# Patient Record
Sex: Female | Born: 1950 | Race: White | Hispanic: No | Marital: Married | Smoking: Former smoker
Health system: Southern US, Community
[De-identification: ages and names within clinical notes are randomized; demographics above are authoritative.]

## PROBLEM LIST (undated history)

## (undated) DIAGNOSIS — Z85828 Personal history of other malignant neoplasm of skin: Secondary | ICD-10-CM

## (undated) DIAGNOSIS — Z860100 Personal history of colon polyps, unspecified: Secondary | ICD-10-CM

## (undated) DIAGNOSIS — M5432 Sciatica, left side: Secondary | ICD-10-CM

## (undated) DIAGNOSIS — M722 Plantar fascial fibromatosis: Secondary | ICD-10-CM

## (undated) DIAGNOSIS — E119 Type 2 diabetes mellitus without complications: Secondary | ICD-10-CM

## (undated) DIAGNOSIS — Z8489 Family history of other specified conditions: Secondary | ICD-10-CM

## (undated) DIAGNOSIS — K219 Gastro-esophageal reflux disease without esophagitis: Secondary | ICD-10-CM

## (undated) DIAGNOSIS — I10 Essential (primary) hypertension: Secondary | ICD-10-CM

## (undated) DIAGNOSIS — IMO0001 Reserved for inherently not codable concepts without codable children: Secondary | ICD-10-CM

## (undated) DIAGNOSIS — Z9889 Other specified postprocedural states: Secondary | ICD-10-CM

## (undated) DIAGNOSIS — C801 Malignant (primary) neoplasm, unspecified: Secondary | ICD-10-CM

## (undated) DIAGNOSIS — E669 Obesity, unspecified: Secondary | ICD-10-CM

## (undated) DIAGNOSIS — M1990 Unspecified osteoarthritis, unspecified site: Secondary | ICD-10-CM

## (undated) DIAGNOSIS — R809 Proteinuria, unspecified: Secondary | ICD-10-CM

## (undated) DIAGNOSIS — F5104 Psychophysiologic insomnia: Secondary | ICD-10-CM

## (undated) DIAGNOSIS — R252 Cramp and spasm: Secondary | ICD-10-CM

## (undated) DIAGNOSIS — I4510 Unspecified right bundle-branch block: Secondary | ICD-10-CM

## (undated) DIAGNOSIS — E785 Hyperlipidemia, unspecified: Secondary | ICD-10-CM

## (undated) DIAGNOSIS — J189 Pneumonia, unspecified organism: Secondary | ICD-10-CM

## (undated) DIAGNOSIS — M5431 Sciatica, right side: Secondary | ICD-10-CM

## (undated) HISTORY — DX: Hyperlipidemia, unspecified: E78.5

## (undated) HISTORY — PX: VARICOSE VEIN SURGERY: SHX832

## (undated) HISTORY — DX: Personal history of other malignant neoplasm of skin: Z85.828

## (undated) HISTORY — DX: Gastro-esophageal reflux disease without esophagitis: K21.9

## (undated) HISTORY — DX: Personal history of colon polyps, unspecified: Z86.0100

## (undated) HISTORY — DX: Unspecified osteoarthritis, unspecified site: M19.90

## (undated) HISTORY — DX: Sciatica, right side: M54.32

## (undated) HISTORY — PX: OTHER SURGICAL HISTORY: SHX169

## (undated) HISTORY — DX: Unspecified right bundle-branch block: I45.10

## (undated) HISTORY — DX: Cramp and spasm: R25.2

## (undated) HISTORY — DX: Plantar fascial fibromatosis: M72.2

## (undated) HISTORY — DX: Essential (primary) hypertension: I10

## (undated) HISTORY — DX: Proteinuria, unspecified: R80.9

## (undated) HISTORY — DX: Personal history of colonic polyps: Z86.010

## (undated) HISTORY — DX: Reserved for inherently not codable concepts without codable children: IMO0001

## (undated) HISTORY — DX: Sciatica, left side: M54.31

## (undated) HISTORY — DX: Type 2 diabetes mellitus without complications: E11.9

## (undated) HISTORY — DX: Obesity, unspecified: E66.9

## (undated) HISTORY — DX: Psychophysiologic insomnia: F51.04

---

## 2004-01-16 ENCOUNTER — Ambulatory Visit: Payer: Self-pay | Admitting: Unknown Physician Specialty

## 2005-01-10 ENCOUNTER — Ambulatory Visit: Payer: Self-pay | Admitting: Family Medicine

## 2005-01-20 ENCOUNTER — Ambulatory Visit: Payer: Self-pay | Admitting: Family Medicine

## 2005-08-14 ENCOUNTER — Ambulatory Visit: Payer: Self-pay | Admitting: Family Medicine

## 2008-09-21 ENCOUNTER — Emergency Department (HOSPITAL_COMMUNITY): Admission: EM | Admit: 2008-09-21 | Discharge: 2008-09-21 | Payer: Self-pay | Admitting: Emergency Medicine

## 2008-11-10 LAB — HM DEXA SCAN: HM Dexa Scan: NORMAL

## 2008-11-16 ENCOUNTER — Ambulatory Visit: Payer: Self-pay | Admitting: Obstetrics and Gynecology

## 2008-11-21 ENCOUNTER — Ambulatory Visit: Payer: Self-pay | Admitting: Obstetrics and Gynecology

## 2008-11-22 ENCOUNTER — Ambulatory Visit: Payer: Self-pay | Admitting: Obstetrics and Gynecology

## 2010-06-05 ENCOUNTER — Ambulatory Visit: Payer: Self-pay | Admitting: Obstetrics and Gynecology

## 2010-10-10 LAB — HM COLONOSCOPY

## 2011-08-29 HISTORY — PX: FOOT SURGERY: SHX648

## 2012-09-09 LAB — HM PAP SMEAR: HM PAP: NORMAL

## 2012-09-10 LAB — HM MAMMOGRAPHY: HM Mammogram: NORMAL

## 2012-09-27 ENCOUNTER — Ambulatory Visit: Payer: Self-pay | Admitting: Obstetrics and Gynecology

## 2013-02-10 HISTORY — PX: BREAST BIOPSY: SHX20

## 2013-03-07 LAB — LIPID PANEL
CHOLESTEROL: 143 mg/dL (ref 0–200)
HDL: 36 mg/dL (ref 35–70)
LDL Cholesterol: 76 mg/dL
Triglycerides: 154 mg/dL (ref 40–160)

## 2013-10-31 ENCOUNTER — Ambulatory Visit: Payer: Self-pay | Admitting: Obstetrics and Gynecology

## 2013-11-03 ENCOUNTER — Ambulatory Visit: Payer: Self-pay | Admitting: Obstetrics and Gynecology

## 2013-11-07 ENCOUNTER — Ambulatory Visit: Payer: Self-pay | Admitting: Obstetrics and Gynecology

## 2013-11-09 LAB — PATHOLOGY REPORT

## 2013-12-12 LAB — HEMOGLOBIN A1C: Hgb A1c MFr Bld: 6.1 % — AB (ref 4.0–6.0)

## 2014-03-02 ENCOUNTER — Ambulatory Visit: Payer: Self-pay | Admitting: Family Medicine

## 2014-11-03 ENCOUNTER — Encounter: Payer: Self-pay | Admitting: Family Medicine

## 2014-11-03 DIAGNOSIS — G4700 Insomnia, unspecified: Secondary | ICD-10-CM | POA: Insufficient documentation

## 2014-11-03 DIAGNOSIS — M5430 Sciatica, unspecified side: Secondary | ICD-10-CM | POA: Insufficient documentation

## 2014-11-03 DIAGNOSIS — M8580 Other specified disorders of bone density and structure, unspecified site: Secondary | ICD-10-CM | POA: Insufficient documentation

## 2014-11-03 DIAGNOSIS — M1990 Unspecified osteoarthritis, unspecified site: Secondary | ICD-10-CM | POA: Insufficient documentation

## 2014-11-03 DIAGNOSIS — I4510 Unspecified right bundle-branch block: Secondary | ICD-10-CM | POA: Insufficient documentation

## 2014-11-03 DIAGNOSIS — K219 Gastro-esophageal reflux disease without esophagitis: Secondary | ICD-10-CM | POA: Insufficient documentation

## 2014-11-03 DIAGNOSIS — M722 Plantar fascial fibromatosis: Secondary | ICD-10-CM | POA: Insufficient documentation

## 2014-11-03 DIAGNOSIS — I10 Essential (primary) hypertension: Secondary | ICD-10-CM | POA: Insufficient documentation

## 2014-11-03 DIAGNOSIS — Z85828 Personal history of other malignant neoplasm of skin: Secondary | ICD-10-CM | POA: Insufficient documentation

## 2014-11-03 DIAGNOSIS — R809 Proteinuria, unspecified: Secondary | ICD-10-CM | POA: Insufficient documentation

## 2014-11-03 DIAGNOSIS — Z86010 Personal history of colonic polyps: Secondary | ICD-10-CM | POA: Insufficient documentation

## 2014-11-03 DIAGNOSIS — E785 Hyperlipidemia, unspecified: Secondary | ICD-10-CM | POA: Insufficient documentation

## 2014-11-03 DIAGNOSIS — E1121 Type 2 diabetes mellitus with diabetic nephropathy: Secondary | ICD-10-CM | POA: Insufficient documentation

## 2014-11-03 DIAGNOSIS — R252 Cramp and spasm: Secondary | ICD-10-CM | POA: Insufficient documentation

## 2014-11-06 ENCOUNTER — Ambulatory Visit (INDEPENDENT_AMBULATORY_CARE_PROVIDER_SITE_OTHER): Payer: Medicare PPO | Admitting: Family Medicine

## 2014-11-06 ENCOUNTER — Encounter: Payer: Self-pay | Admitting: Family Medicine

## 2014-11-06 VITALS — BP 132/68 | HR 96 | Temp 98.2°F | Resp 16 | Ht 70.0 in | Wt 225.1 lb

## 2014-11-06 DIAGNOSIS — R809 Proteinuria, unspecified: Secondary | ICD-10-CM

## 2014-11-06 DIAGNOSIS — R430 Anosmia: Secondary | ICD-10-CM | POA: Diagnosis not present

## 2014-11-06 DIAGNOSIS — G4700 Insomnia, unspecified: Secondary | ICD-10-CM

## 2014-11-06 DIAGNOSIS — Z23 Encounter for immunization: Secondary | ICD-10-CM | POA: Diagnosis not present

## 2014-11-06 DIAGNOSIS — Z1239 Encounter for other screening for malignant neoplasm of breast: Secondary | ICD-10-CM

## 2014-11-06 DIAGNOSIS — E1121 Type 2 diabetes mellitus with diabetic nephropathy: Secondary | ICD-10-CM

## 2014-11-06 DIAGNOSIS — E785 Hyperlipidemia, unspecified: Secondary | ICD-10-CM | POA: Diagnosis not present

## 2014-11-06 DIAGNOSIS — R066 Hiccough: Secondary | ICD-10-CM

## 2014-11-06 DIAGNOSIS — K219 Gastro-esophageal reflux disease without esophagitis: Secondary | ICD-10-CM | POA: Diagnosis not present

## 2014-11-06 DIAGNOSIS — M150 Primary generalized (osteo)arthritis: Secondary | ICD-10-CM | POA: Diagnosis not present

## 2014-11-06 DIAGNOSIS — M159 Polyosteoarthritis, unspecified: Secondary | ICD-10-CM

## 2014-11-06 DIAGNOSIS — I10 Essential (primary) hypertension: Secondary | ICD-10-CM | POA: Diagnosis not present

## 2014-11-06 LAB — POCT UA - MICROALBUMIN: MICROALBUMIN (UR) POC: 20 mg/L

## 2014-11-06 LAB — POCT GLYCOSYLATED HEMOGLOBIN (HGB A1C): HEMOGLOBIN A1C: 9.2

## 2014-11-06 MED ORDER — OMEPRAZOLE 20 MG PO TBEC
1.0000 | DELAYED_RELEASE_TABLET | Freq: Every morning | ORAL | Status: DC
Start: 2014-11-06 — End: 2014-11-09

## 2014-11-06 MED ORDER — BENAZEPRIL HCL 40 MG PO TABS: 40.0000 mg | ORAL_TABLET | Freq: Every day | ORAL | Status: DC

## 2014-11-06 MED ORDER — ATORVASTATIN CALCIUM 40 MG PO TABS: 40.0000 mg | ORAL_TABLET | Freq: Every day | ORAL | Status: DC

## 2014-11-06 MED ORDER — NATEGLINIDE 120 MG PO TABS: 120.0000 mg | ORAL_TABLET | Freq: Two times a day (BID) | ORAL | Status: DC

## 2014-11-06 MED ORDER — AMLODIPINE BESYLATE 10 MG PO TABS: 10.0000 mg | ORAL_TABLET | Freq: Every day | ORAL | Status: DC

## 2014-11-06 MED ORDER — ATENOLOL-CHLORTHALIDONE 50-25 MG PO TABS: 1.0000 | ORAL_TABLET | Freq: Every day | ORAL | Status: DC

## 2014-11-06 MED ORDER — METFORMIN HCL 1000 MG PO TABS: 1000.0000 mg | ORAL_TABLET | Freq: Two times a day (BID) | ORAL | Status: DC

## 2014-11-06 MED ORDER — MELOXICAM 15 MG PO TABS: 15.0000 mg | ORAL_TABLET | Freq: Every day | ORAL | Status: DC

## 2014-11-06 MED ORDER — ZOLPIDEM TARTRATE 5 MG PO TABS: 5.0000 mg | ORAL_TABLET | Freq: Every evening | ORAL | Status: DC

## 2014-11-06 NOTE — Progress Notes (Signed)
Name: Michelle Montgomery   MRN: [id number]    DOB: 1950-06-24   Date:11/06/2014       Progress Note  Subjective  Chief Complaint  Chief Complaint  Patient presents with  . Medication Refill    6 month F/U  . Diabetes    Checks once daily, Low-118 Average-140 High-187  . Hypertension    Checks at pharmacy and gets 118/70's  . Hyperlipidemia  . Insomnia    HPI  DMII with nephropathy: she states she has been eating out a lot, not following a diabetic diet, fsbs is around 140's - usually fasting, not checking 2 hour post-prandially. She states she has noticed polyphagia, polydipsia and polyuria and feeling more tired than usual. No blurred vision. Taking Metformin as prescribed. Also taking Benazepril ( Ace ) as prescribed. Recently seen by Dermatologist and treated for yeast vaginitis.   HTN: taking bp medication as prescribed, denies chest pain, palpitation or SOB . BP is at goal, denies side effects  Hyperlipidemia: taking Atorvastatin, she denies myalgia.  Insomnia: taking Ambien, states it helps her fall asleep and stay asleep. Denies side effects  Lack of sense of smell and taste: symptoms started about 6 months ago. She cannot smell dirty diapers, skunk smell or her dogs bad breath. Also unable to taste the food she eats.  No headache, no blurred vision, no neuro deficit or hoarseness. She also had 5 days of hiccups last week but resolved by itself  GERD: taking Omeprazole, and no heartburn or regurgitation noticed.   Obesity: gained weight since last visit, not following a diet, she states she will lose weight again  Patient Active Problem List   Diagnosis Date Noted  . Osteoarthritis 11/03/2014  . Cramps of lower extremity 11/03/2014  . Neuralgia neuritis, sciatic nerve 11/03/2014  . Insomnia, persistent 11/03/2014  . Dyslipidemia 11/03/2014  . Type 2 diabetes with nephropathy 11/03/2014  . Essential (primary) hypertension 11/03/2014  . History of colon polyps  11/03/2014  . H/O malignant neoplasm of skin 11/03/2014  . Microalbuminuria 11/03/2014  . Obesity, morbid 11/03/2014  . Osteopenia 11/03/2014  . Plantar fasciitis 11/03/2014  . Gastro-esophageal reflux disease without esophagitis 11/03/2014  . Bundle branch block, right 11/03/2014    Past Surgical History  Procedure Laterality Date  . Excision of breast biopsy Left 11/07/2013    Negative for Cancer  . Foot surgery Left 08/29/2011    Spur Removal , and Achilles Tendon Tendolysis  . Varicose vein surgery Bilateral     Family History  Problem Relation Age of Onset  . Cancer Mother     Colon  . Anemia Mother   . Cancer Father     Esophageal  . Diabetes Brother   . Cancer Maternal Uncle     Colon  . Cancer Maternal Grandmother     Colon    Social History   Social History  . Marital Status: Married    Spouse Name: N/A  . Number of Children: N/A  . Years of Education: N/A   Occupational History  . Not on file.   Social History Main Topics  . Smoking status: Former Smoker -- 12.00 packs/day    Types: Cigarettes    Quit date: 02/10/1970  . Smokeless tobacco: Never Used  . Alcohol Use: No  . Drug Use: No  . Sexual Activity: Yes   Other Topics Concern  . Not on file   Social History Narrative     Current outpatient prescriptions:  .  amLODipine (NORVASC) 10 MG tablet, Take 1 tablet (10 mg total) by mouth daily., Disp: 90 tablet, Rfl: 1 .  atenolol-chlorthalidone (TENORETIC) 50-25 MG per tablet, Take 1 tablet by mouth daily., Disp: 90 tablet, Rfl: 1 .  atorvastatin (LIPITOR) 40 MG tablet, Take 1 tablet (40 mg total) by mouth daily., Disp: 90 tablet, Rfl: 1 .  benazepril (LOTENSIN) 40 MG tablet, Take 1 tablet (40 mg total) by mouth daily., Disp: 90 tablet, Rfl: 1 .  meloxicam (MOBIC) 15 MG tablet, Take 1 tablet (15 mg total) by mouth daily., Disp: 90 tablet, Rfl: 0 .  Omeprazole 20 MG TBEC, Take 1 tablet (20 mg total) by mouth every morning., Disp: 90 each, Rfl:  1 .  zolpidem (AMBIEN) 5 MG tablet, Take 1 tablet (5 mg total) by mouth every evening., Disp: 90 tablet, Rfl: 0 .  aspirin 81 MG tablet, Take 1 tablet by mouth daily., Disp: , Rfl:  .  CALCIUM CARBONATE-VIT D-MIN PO, Take 1 tablet by mouth daily., Disp: , Rfl:  .  Cholecalciferol (VITAMIN D) 2000 UNITS CAPS, Take 1 tablet by mouth daily., Disp: , Rfl:  .  conjugated estrogens (PREMARIN) vaginal cream, Place vaginally., Disp: , Rfl:  .  doxepin (SINEQUAN) 10 MG capsule, Take 1 capsule by mouth every evening., Disp: , Rfl:  .  fluconazole (DIFLUCAN) 150 MG tablet, Take 1 tablet by mouth once a week., Disp: , Rfl:  .  metFORMIN (GLUCOPHAGE) 1000 MG tablet, Take 1 tablet (1,000 mg total) by mouth 2 (two) times daily with a meal., Disp: 180 tablet, Rfl: 1 .  nateglinide (STARLIX) 120 MG tablet, Take 1 tablet (120 mg total) by mouth 2 (two) times daily with a meal., Disp: 180 tablet, Rfl: 0 .  Omega-3 Fatty Acids (FISH OIL CONCENTRATE) 435 MG CAPS, Take 1 tablet by mouth daily., Disp: , Rfl:   Allergies  Allergen Reactions  . Codeine   . Penicillins      ROS  Constitutional: Negative for fever , positive for  weight change.  Respiratory: Negative for cough and shortness of breath.   Cardiovascular: Negative for chest pain or palpitations.  Gastrointestinal: Negative for abdominal pain, no bowel changes.  Musculoskeletal: Negative for gait problem or joint swelling.  Skin: Negative for rash.  Neurological: Negative for dizziness or headache.  No other specific complaints in a complete review of systems (except as listed in HPI above).  Objective  Filed Vitals:   11/06/14 0824  BP: 132/68  Pulse: 96  Temp: 98.2 F (36.8 C)  TempSrc: Oral  Resp: 16  Height: 5\' 10"  (1.778 m)  Weight: 225 lb 1.6 oz (102.105 kg)  SpO2: 97%    Body mass index is 32.3 kg/(m^2).  Physical Exam  Constitutional: Patient appears well-developed and well-nourished. Obese  No distress.  HEENT: head  atraumatic, normocephalic, pupils equal and reactive to light,  neck supple, throat within normal limits Cardiovascular: Normal rate, regular rhythm and normal heart sounds.  No murmur heard. No BLE edema. Pulmonary/Chest: Effort normal and breath sounds normal. No respiratory distress. Abdominal: Soft.  There is no tenderness. Psychiatric: Patient has a normal mood and affect. behavior is normal. Judgment and thought content normal.  Recent Results (from the past 2160 hour(s))  POCT HgB A1C     Status: Abnormal   Collection Time: 11/06/14  8:28 AM  Result Value Ref Range   Hemoglobin A1C 9.2     Diabetic Foot Exam: Diabetic Foot Exam - Simple   Simple  Foot Form  Visual Inspection  See comments:  Yes  Sensation Testing  Intact to touch and monofilament testing bilaterally:  Yes  Pulse Check  Posterior Tibialis and Dorsalis pulse intact bilaterally:  Yes  Comments  Thick toenails       PHQ2/9: Depression screen PHQ 2/9 11/06/2014  Decreased Interest 0  Down, Depressed, Hopeless 0  PHQ - 2 Score 0     Fall Risk: Fall Risk  11/06/2014  Falls in the past year? No      Functional Status Survey: Is the patient deaf or have difficulty hearing?: No Does the patient have difficulty seeing, even when wearing glasses/contacts?: Yes (glasses) Does the patient have difficulty concentrating, remembering, or making decisions?: No Does the patient have difficulty walking or climbing stairs?: No Does the patient have difficulty dressing or bathing?: No Does the patient have difficulty doing errands alone such as visiting a doctor's office or shopping?: No    Assessment & Plan  1. Type 2 diabetes mellitus with diabetic nephropathy  Discussed medication option, she wants generic medication only. We will increase metformin, discussed importance of resuming diet - POCT HgB A1C - metFORMIN (GLUCOPHAGE) 1000 MG tablet; Take 1 tablet (1,000 mg total) by mouth 2 (two) times daily with  a meal.  Dispense: 180 tablet; Refill: 1 - nateglinide (STARLIX) 120 MG tablet; Take 1 tablet (120 mg total) by mouth 2 (two) times daily with a meal.  Dispense: 180 tablet; Refill: 0  2. Needs flu shot  - Flu vaccine HIGH DOSE PF (Fluzone High dose)  3. Microalbuminuria  - benazepril (LOTENSIN) 40 MG tablet; Take 1 tablet (40 mg total) by mouth daily.  Dispense: 90 tablet; Refill: 1  4. Obesity, morbid  Discussed with the patient the risk posed by an increased BMI. Discussed importance of portion control, calorie counting and at least 150 minutes of physical activity weekly. Avoid sweet beverages and drink more water. Eat at least 6 servings of fruit and vegetables daily   5. Essential (primary) hypertension  - Comprehensive metabolic panel - CBC with Differential/Platelet - atenolol-chlorthalidone (TENORETIC) 50-25 MG per tablet; Take 1 tablet by mouth daily.  Dispense: 90 tablet; Refill: 1 - amLODipine (NORVASC) 10 MG tablet; Take 1 tablet (10 mg total) by mouth daily.  Dispense: 90 tablet; Refill: 1 6. Insomnia, persistent  - zolpidem (AMBIEN) 5 MG tablet; Take 1 tablet (5 mg total) by mouth every evening.  Dispense: 90 tablet; Refill: 0  7. Dyslipidemia  - atorvastatin (LIPITOR) 40 MG tablet; Take 1 tablet (40 mg total) by mouth daily.  Dispense: 90 tablet; Refill: 1 - Lipid panel  8. Gastro-esophageal reflux disease without esophagitis  - Omeprazole 20 MG TBEC; Take 1 tablet (20 mg total) by mouth every morning.  Dispense: 90 each; Refill: 1  9. Anosmia  - Ambulatory referral to ENT  10. Hiccups  - Ambulatory referral to ENT  11. Primary osteoarthritis involving multiple joints  Taking medication prn  - meloxicam (MOBIC) 15 MG tablet; Take 1 tablet (15 mg total) by mouth daily.  Dispense: 90 tablet; Refill: 0  12. Breast cancer screening  - MM Digital Screening; Future

## 2014-11-07 LAB — COMPREHENSIVE METABOLIC PANEL
ALBUMIN: 4.9 g/dL — AB (ref 3.6–4.8)
ALK PHOS: 46 IU/L (ref 39–117)
ALT: 43 IU/L — ABNORMAL HIGH (ref 0–32)
AST: 27 IU/L (ref 0–40)
Albumin/Globulin Ratio: 2.1 (ref 1.1–2.5)
BILIRUBIN TOTAL: 0.5 mg/dL (ref 0.0–1.2)
BUN / CREAT RATIO: 21 (ref 11–26)
BUN: 16 mg/dL (ref 8–27)
CHLORIDE: 95 mmol/L — AB (ref 97–108)
CO2: 26 mmol/L (ref 18–29)
Calcium: 10.1 mg/dL (ref 8.7–10.3)
Creatinine, Ser: 0.75 mg/dL (ref 0.57–1.00)
GFR calc Af Amer: 97 mL/min/{1.73_m2} (ref >59)
GFR calc non Af Amer: 84 mL/min/{1.73_m2} (ref >59)
GLOBULIN, TOTAL: 2.3 g/dL (ref 1.5–4.5)
Glucose: 227 mg/dL — ABNORMAL HIGH (ref 65–99)
POTASSIUM: 4.6 mmol/L (ref 3.5–5.2)
SODIUM: 140 mmol/L (ref 134–144)
Total Protein: 7.2 g/dL (ref 6.0–8.5)

## 2014-11-07 LAB — CBC WITH DIFFERENTIAL/PLATELET
BASOS ABS: 0 10*3/uL (ref 0.0–0.2)
Basos: 0 %
EOS (ABSOLUTE): 0.2 10*3/uL (ref 0.0–0.4)
Eos: 3 %
HEMATOCRIT: 41.3 % (ref 34.0–46.6)
Hemoglobin: 14.2 g/dL (ref 11.1–15.9)
Immature Grans (Abs): 0 10*3/uL (ref 0.0–0.1)
Immature Granulocytes: 0 %
LYMPHS ABS: 1.8 10*3/uL (ref 0.7–3.1)
Lymphs: 30 %
MCH: 31.1 pg (ref 26.6–33.0)
MCHC: 34.4 g/dL (ref 31.5–35.7)
MCV: 91 fL (ref 79–97)
MONOS ABS: 0.7 10*3/uL (ref 0.1–0.9)
Monocytes: 11 %
Neutrophils Absolute: 3.2 10*3/uL (ref 1.4–7.0)
Neutrophils: 56 %
Platelets: 225 10*3/uL (ref 150–379)
RBC: 4.56 x10E6/uL (ref 3.77–5.28)
RDW: 13.6 % (ref 12.3–15.4)
WBC: 5.8 10*3/uL (ref 3.4–10.8)

## 2014-11-07 LAB — LIPID PANEL
CHOL/HDL RATIO: 3 ratio (ref 0.0–4.4)
Cholesterol, Total: 108 mg/dL (ref 100–199)
HDL: 36 mg/dL — ABNORMAL LOW (ref >39)
LDL Calculated: 38 mg/dL (ref 0–99)
TRIGLYCERIDES: 172 mg/dL — AB (ref 0–149)
VLDL Cholesterol Cal: 34 mg/dL (ref 5–40)

## 2014-11-08 ENCOUNTER — Telehealth: Payer: Self-pay

## 2014-11-08 NOTE — Progress Notes (Signed)
Patient notified

## 2014-11-08 NOTE — Telephone Encounter (Signed)
Left a message for patient to return my call regarding lab results.

## 2014-11-08 NOTE — Telephone Encounter (Signed)
-----   Message from Steele Sizer, MD sent at 11/07/2014  9:26 PM EDT ----- Glucose out of control, albumin is elevated, one of the liver enzymes slightly bumped but not to worry about it Normal CBC Lipid panel shows low HDL : to improve HDL patient  needs to eat tree nuts ( pecans/pistachios/almonds ) four times weekly, eat fish two times weekly  and exercise  at least 150 minutes per week. Continue statin Triglycerides will decrease once glucose gets under control

## 2014-11-09 ENCOUNTER — Other Ambulatory Visit: Payer: Self-pay

## 2014-11-09 DIAGNOSIS — K219 Gastro-esophageal reflux disease without esophagitis: Secondary | ICD-10-CM

## 2014-11-09 MED ORDER — OMEPRAZOLE 20 MG PO CPDR: 20.0000 mg | DELAYED_RELEASE_CAPSULE | Freq: Every day | ORAL | Status: DC

## 2015-01-16 ENCOUNTER — Encounter: Payer: Self-pay | Admitting: Family Medicine

## 2015-01-16 ENCOUNTER — Ambulatory Visit (INDEPENDENT_AMBULATORY_CARE_PROVIDER_SITE_OTHER): Payer: Medicare PPO | Admitting: Family Medicine

## 2015-01-16 VITALS — BP 122/84 | HR 86 | Temp 98.1°F | Resp 18 | Ht 70.0 in | Wt 222.5 lb

## 2015-01-16 DIAGNOSIS — R431 Parosmia: Secondary | ICD-10-CM

## 2015-01-16 DIAGNOSIS — E785 Hyperlipidemia, unspecified: Secondary | ICD-10-CM

## 2015-01-16 DIAGNOSIS — G4700 Insomnia, unspecified: Secondary | ICD-10-CM

## 2015-01-16 DIAGNOSIS — K219 Gastro-esophageal reflux disease without esophagitis: Secondary | ICD-10-CM

## 2015-01-16 DIAGNOSIS — I10 Essential (primary) hypertension: Secondary | ICD-10-CM | POA: Diagnosis not present

## 2015-01-16 DIAGNOSIS — R809 Proteinuria, unspecified: Secondary | ICD-10-CM

## 2015-01-16 DIAGNOSIS — R439 Unspecified disturbances of smell and taste: Secondary | ICD-10-CM | POA: Insufficient documentation

## 2015-01-16 DIAGNOSIS — E1121 Type 2 diabetes mellitus with diabetic nephropathy: Secondary | ICD-10-CM | POA: Diagnosis not present

## 2015-01-16 MED ORDER — OMEPRAZOLE 20 MG PO CPDR
20.0000 mg | DELAYED_RELEASE_CAPSULE | Freq: Every day | ORAL | Status: DC
Start: 2015-01-16 — End: 2016-02-12

## 2015-01-16 MED ORDER — GLIPIZIDE ER 2.5 MG PO TB24: 2.5000 mg | ORAL_TABLET | Freq: Every day | ORAL | Status: DC

## 2015-01-16 MED ORDER — TRIAMCINOLONE ACETONIDE 55 MCG/ACT NA AERO: 2.0000 | INHALATION_SPRAY | Freq: Every day | NASAL | Status: DC

## 2015-01-16 NOTE — Progress Notes (Signed)
Name: Michelle Montgomery   MRN: [id number]    DOB: 1950/08/03   Date:01/16/2015       Progress Note  Subjective  Chief Complaint  Chief Complaint  Patient presents with  . Medication Refill    follow-up  . Diabetes    Checks BG 1xday low-125, high-160.  Nkever started starlix it was to exspensive, needs something cheaper.  . Hypertension  . Insomnia  . Gastroesophageal Reflux  . Hyperlipidemia    HPI  DMII with nephropathy: she is trying to cook more at home and having more vegetables. FSBS 130-140's usually fasting, not checking 2 hour post-prandially. She states she has noticed polyphagia, she denies  polydipsia or polyuria  No blurred vision. Taking Metformin as prescribed, unable to afford the Starlix prescribed last week and would like to try something cheaper. Also taking Benazepril ( Ace ) as prescribed.  HTN: taking bp medication as prescribed, denies chest pain, palpitation or SOB . BP is at goal, denies side effects   Hyperlipidemia: taking Atorvastatin, she denies myalgia.   Insomnia: taking Ambien, states it helps her fall asleep and stay asleep. Denies side effects. No amnesia  Lack of sense of smell and taste: symptoms started about 9 months ago. She cannot smell dirty diapers, skunk smell or her dogs bad breath, however she was seen by ENT and has been using nasal steroid daily and symptoms have improved.  Obesity: she lost 3 lbs since last visit, she has been trying to eat better  GERD: takes Prilosec daily and not heartburn or regurgitation as long as she takes it daily, tried stopping medication but symptoms resolves.   Patient Active Problem List   Diagnosis Date Noted  . Smell disturbance 01/16/2015  . Osteoarthritis 11/03/2014  . Cramps of lower extremity 11/03/2014  . Neuralgia neuritis, sciatic nerve 11/03/2014  . Insomnia, persistent 11/03/2014  . Dyslipidemia 11/03/2014  . Type 2 diabetes with nephropathy ([place]) 11/03/2014  . Essential (primary)  hypertension 11/03/2014  . History of colon polyps 11/03/2014  . H/O malignant neoplasm of skin 11/03/2014  . Microalbuminuria 11/03/2014  . Obesity, morbid ([place]) 11/03/2014  . Osteopenia 11/03/2014  . Plantar fasciitis 11/03/2014  . Gastro-esophageal reflux disease without esophagitis 11/03/2014  . Bundle branch block, right 11/03/2014    Past Surgical History  Procedure Laterality Date  . Excision of breast biopsy Left 11/07/2013    Negative for Cancer  . Foot surgery Left 08/29/2011    Spur Removal , and Achilles Tendon Tendolysis  . Varicose vein surgery Bilateral     Family History  Problem Relation Age of Onset  . Cancer Mother     Colon  . Anemia Mother   . Cancer Father     Esophageal  . Diabetes Brother   . Cancer Maternal Uncle     Colon  . Cancer Maternal Grandmother     Colon    Social History   Social History  . Marital Status: Married    Spouse Name: N/A  . Number of Children: N/A  . Years of Education: N/A   Occupational History  . Not on file.   Social History Main Topics  . Smoking status: Former Smoker -- 12.00 packs/day    Types: Cigarettes    Quit date: 02/10/1970  . Smokeless tobacco: Never Used  . Alcohol Use: No  . Drug Use: No  . Sexual Activity: Yes   Other Topics Concern  . Not on file   Social History Narrative  Current outpatient prescriptions:  .  amLODipine (NORVASC) 10 MG tablet, Take 1 tablet (10 mg total) by mouth daily., Disp: 90 tablet, Rfl: 1 .  aspirin 81 MG tablet, Take 1 tablet by mouth daily., Disp: , Rfl:  .  atenolol-chlorthalidone (TENORETIC) 50-25 MG per tablet, Take 1 tablet by mouth daily., Disp: 90 tablet, Rfl: 1 .  atorvastatin (LIPITOR) 40 MG tablet, Take 1 tablet (40 mg total) by mouth daily., Disp: 90 tablet, Rfl: 1 .  benazepril (LOTENSIN) 40 MG tablet, Take 1 tablet (40 mg total) by mouth daily., Disp: 90 tablet, Rfl: 1 .  CALCIUM CARBONATE-VIT D-MIN PO, Take 1 tablet by mouth daily., Disp: ,  Rfl:  .  Cholecalciferol (VITAMIN D) 2000 UNITS CAPS, Take 1 tablet by mouth daily., Disp: , Rfl:  .  glipiZIDE (GLUCOTROL XL) 2.5 MG 24 hr tablet, Take 1 tablet (2.5 mg total) by mouth daily with breakfast., Disp: 90 tablet, Rfl: 0 .  meloxicam (MOBIC) 15 MG tablet, Take 1 tablet (15 mg total) by mouth daily., Disp: 90 tablet, Rfl: 0 .  metFORMIN (GLUCOPHAGE) 1000 MG tablet, Take 1 tablet (1,000 mg total) by mouth 2 (two) times daily with a meal., Disp: 180 tablet, Rfl: 1 .  Omega-3 Fatty Acids (FISH OIL CONCENTRATE) 435 MG CAPS, Take 1 tablet by mouth daily., Disp: , Rfl:  .  omeprazole (PRILOSEC) 20 MG capsule, Take 1 capsule (20 mg total) by mouth daily., Disp: 90 capsule, Rfl: 4 .  triamcinolone (NASACORT AQ) 55 MCG/ACT AERO nasal inhaler, Place 2 sprays into the nose daily., Disp: 1 Inhaler, Rfl: 0 .  zolpidem (AMBIEN) 5 MG tablet, Take 1 tablet (5 mg total) by mouth every evening., Disp: 90 tablet, Rfl: 0  Allergies  Allergen Reactions  . Codeine   . Penicillins      ROS  Constitutional: Negative for fever or significant weight change.  Respiratory: Negative for cough and shortness of breath.   Cardiovascular: Negative for chest pain or palpitations.  Gastrointestinal: Negative for abdominal pain, no bowel changes.  Musculoskeletal: Negative for gait problem or joint swelling.  Skin: Negative for rash.  Neurological: Negative for dizziness or headache.  No other specific complaints in a complete review of systems (except as listed in HPI above).  Objective  Filed Vitals:   01/16/15 0950  BP: 122/84  Pulse: 86  Temp: 98.1 F (36.7 C)  TempSrc: Oral  Resp: 18  Height: 5\' 10"  (1.778 m)  Weight: 222 lb 8 oz (100.925 kg)  SpO2: 92%    Body mass index is 31.93 kg/(m^2).  Physical Exam  Constitutional: Patient appears well-developed and well-nourished. Obese  No distress.  HEENT: head atraumatic, normocephalic, pupils equal and reactive to light, neck supple, throat  within normal limits Cardiovascular: Normal rate, regular rhythm and normal heart sounds.  No murmur heard. No BLE edema. Pulmonary/Chest: Effort normal and breath sounds normal. No respiratory distress. Abdominal: Soft.  There is no tenderness. Psychiatric: Patient has a normal mood and affect. behavior is normal. Judgment and thought content normal.  Recent Results (from the past 2160 hour(s))  POCT HgB A1C     Status: Abnormal   Collection Time: 11/06/14  8:28 AM  Result Value Ref Range   Hemoglobin A1C 9.2   Comprehensive metabolic panel     Status: Abnormal   Collection Time: 11/06/14  9:21 AM  Result Value Ref Range   Glucose 227 (H) 65 - 99 mg/dL   BUN 16 8 - 27 mg/dL  Creatinine, Ser 0.75 0.57 - 1.00 mg/dL   GFR calc non Af Amer 84 >59 mL/min/1.73   GFR calc Af Amer 97 >59 mL/min/1.73   BUN/Creatinine Ratio 21 11 - 26   Sodium 140 134 - 144 mmol/L   Potassium 4.6 3.5 - 5.2 mmol/L   Chloride 95 (L) 97 - 108 mmol/L   CO2 26 18 - 29 mmol/L   Calcium 10.1 8.7 - 10.3 mg/dL   Total Protein 7.2 6.0 - 8.5 g/dL   Albumin 4.9 (H) 3.6 - 4.8 g/dL   Globulin, Total 2.3 1.5 - 4.5 g/dL   Albumin/Globulin Ratio 2.1 1.1 - 2.5   Bilirubin Total 0.5 0.0 - 1.2 mg/dL   Alkaline Phosphatase 46 39 - 117 IU/L   AST 27 0 - 40 IU/L   ALT 43 (H) 0 - 32 IU/L  CBC with Differential/Platelet     Status: None   Collection Time: 11/06/14  9:21 AM  Result Value Ref Range   WBC 5.8 3.4 - 10.8 x10E3/uL   RBC 4.56 3.77 - 5.28 x10E6/uL   Hemoglobin 14.2 11.1 - 15.9 g/dL   Hematocrit 41.3 34.0 - 46.6 %   MCV 91 79 - 97 fL   MCH 31.1 26.6 - 33.0 pg   MCHC 34.4 31.5 - 35.7 g/dL   RDW 13.6 12.3 - 15.4 %   Platelets 225 150 - 379 x10E3/uL   Neutrophils 56 %   Lymphs 30 %   Monocytes 11 %   Eos 3 %   Basos 0 %   Neutrophils Absolute 3.2 1.4 - 7.0 x10E3/uL   Lymphocytes Absolute 1.8 0.7 - 3.1 x10E3/uL   Monocytes Absolute 0.7 0.1 - 0.9 x10E3/uL   EOS (ABSOLUTE) 0.2 0.0 - 0.4 x10E3/uL   Basophils  Absolute 0.0 0.0 - 0.2 x10E3/uL   Immature Granulocytes 0 %   Immature Grans (Abs) 0.0 0.0 - 0.1 x10E3/uL  Lipid panel     Status: Abnormal   Collection Time: 11/06/14  9:21 AM  Result Value Ref Range   Cholesterol, Total 108 100 - 199 mg/dL   Triglycerides 172 (H) 0 - 149 mg/dL   HDL 36 (L) >39 mg/dL    Comment: According to ATP-III Guidelines, HDL-C >59 mg/dL is considered a negative risk factor for CHD.    VLDL Cholesterol Cal 34 5 - 40 mg/dL   LDL Calculated 38 0 - 99 mg/dL   Chol/HDL Ratio 3.0 0.0 - 4.4 ratio units    Comment:                                   T. Chol/HDL Ratio                                             Men  Women                               1/2 Avg.Risk  3.4    3.3                                   Avg.Risk  5.0    4.4  2X Avg.Risk  9.6    7.1                                3X Avg.Risk 23.4   11.0   POCT UA - Microalbumin     Status: None   Collection Time: 11/06/14 12:16 PM  Result Value Ref Range   Microalbumin Ur, POC 20 mg/L   Creatinine, POC  mg/dL   Albumin/Creatinine Ratio, Urine, POC       PHQ2/9: Depression screen [facility] 2/9 01/16/2015 11/06/2014  Decreased Interest 0 0  Down, Depressed, Hopeless 0 0  PHQ - 2 Score 0 0     Fall Risk: Fall Risk  01/16/2015 11/06/2014  Falls in the past year? No No     Functional Status Survey: Is the patient deaf or have difficulty hearing?: No Does the patient have difficulty seeing, even when wearing glasses/contacts?: Yes (glasses) Does the patient have difficulty concentrating, remembering, or making decisions?: No Does the patient have difficulty walking or climbing stairs?: No Does the patient have difficulty dressing or bathing?: No Does the patient have difficulty doing errands alone such as visiting a doctor's office or shopping?: No    Assessment & Plan  1. Type 2 diabetes mellitus with diabetic nephropathy, without long-term current use of insulin (HCC)  -  glipiZIDE (GLUCOTROL XL) 2.5 MG 24 hr tablet; Take 1 tablet (2.5 mg total) by mouth daily with breakfast.  Dispense: 90 tablet; Refill: 0  2. Insomnia, persistent  Continue medication   3. Dyslipidemia  Lipid panel shows low HDL : to improve HDL patient  needs to eat tree nuts ( pecans/pistachios/almonds ) four times weekly, eat fish two times weekly  and exercise  at least 150 minutes per week  4. Microalbuminuria  On ACE  5. Essential (primary) hypertension  At goal, continue medication   6. Morbid obesity, unspecified obesity type [facility])  Discussed with the patient the risk posed by an increased BMI. Discussed importance of portion control, calorie counting and at least 150 minutes of physical activity weekly. Avoid sweet beverages and drink more water. Eat at least 6 servings of fruit and vegetables daily   7. Gastro-esophageal reflux disease without esophagitis  - omeprazole (PRILOSEC) 20 MG capsule; Take 1 capsule (20 mg total) by mouth daily.  Dispense: 90 capsule; Refill: 4  8. Smell disturbance  Doing well, seeing Dr. Richardson Landry, sense of smell is returning - triamcinolone (NASACORT AQ) 55 MCG/ACT AERO nasal inhaler; Place 2 sprays into the nose daily.  Dispense: 1 Inhaler; Refill: 0

## 2015-03-12 ENCOUNTER — Ambulatory Visit: Payer: Medicare PPO | Admitting: Family Medicine

## 2015-04-10 ENCOUNTER — Ambulatory Visit (INDEPENDENT_AMBULATORY_CARE_PROVIDER_SITE_OTHER): Payer: Medicare PPO | Admitting: Family Medicine

## 2015-04-10 ENCOUNTER — Encounter: Payer: Self-pay | Admitting: Family Medicine

## 2015-04-10 VITALS — BP 112/62 | HR 65 | Temp 97.8°F | Resp 12 | Ht 70.0 in | Wt 220.2 lb

## 2015-04-10 DIAGNOSIS — E1121 Type 2 diabetes mellitus with diabetic nephropathy: Secondary | ICD-10-CM | POA: Diagnosis not present

## 2015-04-10 DIAGNOSIS — E785 Hyperlipidemia, unspecified: Secondary | ICD-10-CM

## 2015-04-10 DIAGNOSIS — G4700 Insomnia, unspecified: Secondary | ICD-10-CM

## 2015-04-10 DIAGNOSIS — R809 Proteinuria, unspecified: Secondary | ICD-10-CM | POA: Diagnosis not present

## 2015-04-10 DIAGNOSIS — M150 Primary generalized (osteo)arthritis: Secondary | ICD-10-CM

## 2015-04-10 DIAGNOSIS — Z23 Encounter for immunization: Secondary | ICD-10-CM

## 2015-04-10 DIAGNOSIS — I10 Essential (primary) hypertension: Secondary | ICD-10-CM

## 2015-04-10 DIAGNOSIS — M159 Polyosteoarthritis, unspecified: Secondary | ICD-10-CM

## 2015-04-10 LAB — POCT UA - MICROALBUMIN: MICROALBUMIN (UR) POC: NEGATIVE mg/L

## 2015-04-10 LAB — POCT GLYCOSYLATED HEMOGLOBIN (HGB A1C): HEMOGLOBIN A1C: 7.9

## 2015-04-10 MED ORDER — BENAZEPRIL HCL 40 MG PO TABS: 40.0000 mg | ORAL_TABLET | Freq: Every day | ORAL | Status: DC

## 2015-04-10 MED ORDER — AMLODIPINE BESYLATE 10 MG PO TABS: 10.0000 mg | ORAL_TABLET | Freq: Every day | ORAL | Status: DC

## 2015-04-10 MED ORDER — ATORVASTATIN CALCIUM 40 MG PO TABS: 40.0000 mg | ORAL_TABLET | Freq: Every day | ORAL | Status: DC

## 2015-04-10 MED ORDER — ZOLPIDEM TARTRATE 5 MG PO TABS: 5.0000 mg | ORAL_TABLET | Freq: Every evening | ORAL | Status: DC

## 2015-04-10 MED ORDER — METFORMIN HCL 1000 MG PO TABS: 1000.0000 mg | ORAL_TABLET | Freq: Two times a day (BID) | ORAL | Status: DC

## 2015-04-10 MED ORDER — ATENOLOL-CHLORTHALIDONE 50-25 MG PO TABS: 1.0000 | ORAL_TABLET | Freq: Every day | ORAL | Status: DC

## 2015-04-10 MED ORDER — MELOXICAM 15 MG PO TABS: 15.0000 mg | ORAL_TABLET | Freq: Every day | ORAL | Status: DC

## 2015-04-10 NOTE — Progress Notes (Signed)
Name: Michelle Montgomery   MRN: [id number]    DOB: 1950/09/10   Date:04/10/2015       Progress Note  Subjective  Chief Complaint  Chief Complaint  Patient presents with  . Diabetes    checks bs daily. highest: 180, lowest:130.   Marland Kitchen Gastroesophageal Reflux  . Hypertension  . Hyperlipidemia  . Obesity  . Insomnia  . Medication Refill    HPI  DMII with nephropathy: she states she has been following a diet over the past month.  Fsbs is around 130's - usually fasting She states the polyphagia, polydipsia and polyuria has resolved. No blurred vision. Taking Metformin as prescribed, but she never started taking Glipizide. Also taking Benazepril ( Ace ) as prescribed. Recently had an eye exam.  HTN: taking bp medication as prescribed, denies chest pain, palpitation or SOB . BP is at goal, denies side effects. No dizziness  Hyperlipidemia: taking Atorvastatin, she denies myalgia. She is eating a [employer] for the past month  Insomnia: taking Ambien prn, states it helps her fall asleep and stay asleep. Denies side effects  Lack of sense of smell and taste: symptoms started about 10 months ago. She still can't  smell dirty diapers, skunk smell or her dogs bad breath. She saw ENT and is on a different nasal steroid and states she is smelling a little better now.    GERD: taking Omeprazole, and no heartburn or regurgitation noticed.    Obesity: lost a few lbs since last visit, eating a [employer] , feeling better.     Patient Active Problem List   Diagnosis Date Noted  . Smell disturbance 01/16/2015  . Osteoarthritis 11/03/2014  . Cramps of lower extremity 11/03/2014  . Neuralgia neuritis, sciatic nerve 11/03/2014  . Insomnia, persistent 11/03/2014  . Dyslipidemia 11/03/2014  . Type 2 diabetes with nephropathy ([place]) 11/03/2014  . Essential (primary) hypertension 11/03/2014  . History of colon polyps 11/03/2014  . H/O malignant neoplasm of skin 11/03/2014  .  Microalbuminuria 11/03/2014  . Obesity, morbid ([place]) 11/03/2014  . Osteopenia 11/03/2014  . Plantar fasciitis 11/03/2014  . Gastro-esophageal reflux disease without esophagitis 11/03/2014  . Bundle branch block, right 11/03/2014    Past Surgical History  Procedure Laterality Date  . Excision of breast biopsy Left 11/07/2013    Negative for Cancer  . Foot surgery Left 08/29/2011    Spur Removal , and Achilles Tendon Tendolysis  . Varicose vein surgery Bilateral     Family History  Problem Relation Age of Onset  . Cancer Mother     Colon  . Anemia Mother   . Cancer Father     Esophageal  . Diabetes Brother   . Cancer Maternal Uncle     Colon  . Cancer Maternal Grandmother     Colon    Social History   Social History  . Marital Status: Married    Spouse Name: N/A  . Number of Children: N/A  . Years of Education: N/A   Occupational History  . Not on file.   Social History Main Topics  . Smoking status: Former Smoker -- 12.00 packs/day    Types: Cigarettes    Quit date: 02/10/1970  . Smokeless tobacco: Never Used  . Alcohol Use: No  . Drug Use: No  . Sexual Activity: Yes   Other Topics Concern  . Not on file   Social History Narrative     Current outpatient prescriptions:  .  amLODipine (NORVASC) 10  MG tablet, Take 1 tablet (10 mg total) by mouth daily., Disp: 90 tablet, Rfl: 1 .  aspirin 81 MG tablet, Take 1 tablet by mouth daily., Disp: , Rfl:  .  atenolol-chlorthalidone (TENORETIC) 50-25 MG tablet, Take 1 tablet by mouth daily., Disp: 90 tablet, Rfl: 1 .  atorvastatin (LIPITOR) 40 MG tablet, Take 1 tablet (40 mg total) by mouth daily., Disp: 90 tablet, Rfl: 1 .  benazepril (LOTENSIN) 40 MG tablet, Take 1 tablet (40 mg total) by mouth daily., Disp: 90 tablet, Rfl: 1 .  CALCIUM CARBONATE-VIT D-MIN PO, Take 1 tablet by mouth daily., Disp: , Rfl:  .  Cholecalciferol (VITAMIN D) 2000 UNITS CAPS, Take 1 tablet by mouth daily., Disp: , Rfl:  .  glipiZIDE  (GLUCOTROL XL) 2.5 MG 24 hr tablet, Take 1 tablet (2.5 mg total) by mouth daily with breakfast., Disp: 90 tablet, Rfl: 0 .  meloxicam (MOBIC) 15 MG tablet, Take 1 tablet (15 mg total) by mouth daily., Disp: 90 tablet, Rfl: 0 .  metFORMIN (GLUCOPHAGE) 1000 MG tablet, Take 1 tablet (1,000 mg total) by mouth 2 (two) times daily with a meal., Disp: 180 tablet, Rfl: 1 .  Omega-3 Fatty Acids (FISH OIL CONCENTRATE) 435 MG CAPS, Take 1 tablet by mouth daily., Disp: , Rfl:  .  omeprazole (PRILOSEC) 20 MG capsule, Take 1 capsule (20 mg total) by mouth daily., Disp: 90 capsule, Rfl: 4 .  triamcinolone (NASACORT AQ) 55 MCG/ACT AERO nasal inhaler, Place 2 sprays into the nose daily., Disp: 1 Inhaler, Rfl: 0 .  zolpidem (AMBIEN) 5 MG tablet, Take 1 tablet (5 mg total) by mouth every evening., Disp: 90 tablet, Rfl: 0  Allergies  Allergen Reactions  . Codeine   . Penicillins      ROS  Constitutional: Negative for fever or significant weight change.  Respiratory: Negative for cough and shortness of breath.   Cardiovascular: Negative for chest pain or palpitations.  Gastrointestinal: Negative for abdominal pain, no bowel changes.  Musculoskeletal: Negative for gait problem or joint swelling.  Skin: Negative for rash.  Neurological: Negative for dizziness or headache.  No other specific complaints in a complete review of systems (except as listed in HPI above).  Objective  Filed Vitals:   04/10/15 0805  BP: 112/62  Pulse: 65  Temp: 97.8 F (36.6 C)  TempSrc: Oral  Resp: 12  Height: 5\' 10"  (1.778 m)  Weight: 220 lb 3.2 oz (99.882 kg)  SpO2: 96%    Body mass index is 31.6 kg/(m^2).  Physical Exam  Constitutional: Patient appears well-developed and well-nourished. Obese  No distress.  HEENT: head atraumatic, normocephalic, pupils equal and reactive to light, , neck supple, throat within normal limits Cardiovascular: Normal rate, regular rhythm and normal heart sounds.  No murmur heard. No  BLE edema. Pulmonary/Chest: Effort normal and breath sounds normal. No respiratory distress. Abdominal: Soft.  There is no tenderness. Psychiatric: Patient has a normal mood and affect. behavior is normal. Judgment and thought content normal.  Recent Results (from the past 2160 hour(s))  POCT glycosylated hemoglobin (Hb A1C)     Status: Abnormal   Collection Time: 04/10/15  8:12 AM  Result Value Ref Range   Hemoglobin A1C 7.9   POCT UA - Microalbumin     Status: Normal   Collection Time: 04/10/15  8:12 AM  Result Value Ref Range   Microalbumin Ur, POC NEGATIVE mg/L   Creatinine, POC  mg/dL   Albumin/Creatinine Ratio, Urine, POC  PHQ2/9: Depression screen [facility] 2/9 04/10/2015 01/16/2015 11/06/2014  Decreased Interest 0 0 0  Down, Depressed, Hopeless 0 0 0  PHQ - 2 Score 0 0 0     Fall Risk: Fall Risk  04/10/2015 01/16/2015 11/06/2014  Falls in the past year? No No No    Functional Status Survey: Is the patient deaf or have difficulty hearing?: No Does the patient have difficulty seeing, even when wearing glasses/contacts?: No Does the patient have difficulty concentrating, remembering, or making decisions?: No Does the patient have difficulty walking or climbing stairs?: No Does the patient have difficulty dressing or bathing?: No Does the patient have difficulty doing errands alone such as visiting a doctor's office or shopping?: No    Assessment & Plan  1. Type 2 diabetes with nephropathy ([place])  Doing better, changed her diet, explained goal is hgbA1C below 7, explained she can take Glipizide prn only - when she is travelling and eating out - POCT glycosylated hemoglobin (Hb A1C) - POCT UA - Microalbumin - metFORMIN (GLUCOPHAGE) 1000 MG tablet; Take 1 tablet (1,000 mg total) by mouth 2 (two) times daily with a meal.  Dispense: 180 tablet; Refill: 1  2. Insomnia, persistent  - zolpidem (AMBIEN) 5 MG tablet; Take 1 tablet (5 mg total) by mouth every evening.  Dispense:  90 tablet; Refill: 0  3. Primary osteoarthritis involving multiple joints  - meloxicam (MOBIC) 15 MG tablet; Take 1 tablet (15 mg total) by mouth daily.  Dispense: 90 tablet; Refill: 0  4. Microalbuminuria  - benazepril (LOTENSIN) 40 MG tablet; Take 1 tablet (40 mg total) by mouth daily.  Dispense: 90 tablet; Refill: 1  5. Essential (primary) hypertension  - benazepril (LOTENSIN) 40 MG tablet; Take 1 tablet (40 mg total) by mouth daily.  Dispense: 90 tablet; Refill: 1 - atenolol-chlorthalidone (TENORETIC) 50-25 MG tablet; Take 1 tablet by mouth daily.  Dispense: 90 tablet; Refill: 1 - amLODipine (NORVASC) 10 MG tablet; Take 1 tablet (10 mg total) by mouth daily.  Dispense: 90 tablet; Refill: 1  6. Dyslipidemia  - atorvastatin (LIPITOR) 40 MG tablet; Take 1 tablet (40 mg total) by mouth daily.  Dispense: 90 tablet; Refill: 1  7. Need for pneumococcal vaccination  - Pneumococcal polysaccharide vaccine 23-valent greater than or equal to 2yo subcutaneous/IM

## 2015-07-18 ENCOUNTER — Other Ambulatory Visit: Payer: Self-pay | Admitting: Family Medicine

## 2015-07-19 NOTE — Telephone Encounter (Signed)
Patient requesting refill. 

## 2015-08-15 ENCOUNTER — Ambulatory Visit: Payer: Medicare PPO | Admitting: Family Medicine

## 2015-08-24 ENCOUNTER — Encounter: Payer: Self-pay | Admitting: Family Medicine

## 2015-08-24 ENCOUNTER — Ambulatory Visit (INDEPENDENT_AMBULATORY_CARE_PROVIDER_SITE_OTHER): Payer: Medicare PPO | Admitting: Family Medicine

## 2015-08-24 VITALS — BP 126/74 | HR 70 | Temp 98.2°F | Resp 16 | Ht 70.0 in | Wt 220.1 lb

## 2015-08-24 DIAGNOSIS — H8111 Benign paroxysmal vertigo, right ear: Secondary | ICD-10-CM | POA: Diagnosis not present

## 2015-08-24 DIAGNOSIS — E1121 Type 2 diabetes mellitus with diabetic nephropathy: Secondary | ICD-10-CM | POA: Diagnosis not present

## 2015-08-24 DIAGNOSIS — R809 Proteinuria, unspecified: Secondary | ICD-10-CM

## 2015-08-24 DIAGNOSIS — R431 Parosmia: Secondary | ICD-10-CM | POA: Diagnosis not present

## 2015-08-24 DIAGNOSIS — Z9289 Personal history of other medical treatment: Secondary | ICD-10-CM

## 2015-08-24 DIAGNOSIS — E785 Hyperlipidemia, unspecified: Secondary | ICD-10-CM | POA: Diagnosis not present

## 2015-08-24 DIAGNOSIS — M150 Primary generalized (osteo)arthritis: Secondary | ICD-10-CM | POA: Diagnosis not present

## 2015-08-24 DIAGNOSIS — G4700 Insomnia, unspecified: Secondary | ICD-10-CM | POA: Diagnosis not present

## 2015-08-24 DIAGNOSIS — Z79899 Other long term (current) drug therapy: Secondary | ICD-10-CM

## 2015-08-24 DIAGNOSIS — M159 Polyosteoarthritis, unspecified: Secondary | ICD-10-CM

## 2015-08-24 DIAGNOSIS — I10 Essential (primary) hypertension: Secondary | ICD-10-CM

## 2015-08-24 DIAGNOSIS — K219 Gastro-esophageal reflux disease without esophagitis: Secondary | ICD-10-CM | POA: Diagnosis not present

## 2015-08-24 DIAGNOSIS — R439 Unspecified disturbances of smell and taste: Secondary | ICD-10-CM

## 2015-08-24 LAB — COMPLETE METABOLIC PANEL WITH GFR
ALT: 27 U/L (ref 6–29)
AST: 23 U/L (ref 10–35)
Albumin: 4.5 g/dL (ref 3.6–5.1)
Alkaline Phosphatase: 36 U/L (ref 33–130)
BUN: 11 mg/dL (ref 7–25)
CO2: 31 mmol/L (ref 20–31)
CREATININE: 0.65 mg/dL (ref 0.50–0.99)
Calcium: 9.9 mg/dL (ref 8.6–10.4)
Chloride: 99 mmol/L (ref 98–110)
Glucose, Bld: 176 mg/dL — ABNORMAL HIGH (ref 65–99)
POTASSIUM: 4.8 mmol/L (ref 3.5–5.3)
Sodium: 140 mmol/L (ref 135–146)
TOTAL PROTEIN: 7.2 g/dL (ref 6.1–8.1)
Total Bilirubin: 0.6 mg/dL (ref 0.2–1.2)

## 2015-08-24 LAB — POCT GLYCOSYLATED HEMOGLOBIN (HGB A1C): Hemoglobin A1C: 8.1

## 2015-08-24 LAB — LIPID PANEL
CHOL/HDL RATIO: 2.4 ratio (ref <=5.0)
CHOLESTEROL: 94 mg/dL — AB (ref 125–200)
HDL: 39 mg/dL — AB (ref >=46)
LDL CALC: 26 mg/dL (ref <130)
TRIGLYCERIDES: 145 mg/dL (ref <150)
VLDL: 29 mg/dL (ref <30)

## 2015-08-24 MED ORDER — ATORVASTATIN CALCIUM 40 MG PO TABS: 40.0000 mg | ORAL_TABLET | Freq: Every day | ORAL | Status: DC

## 2015-08-24 MED ORDER — ATENOLOL-CHLORTHALIDONE 50-25 MG PO TABS: 1.0000 | ORAL_TABLET | Freq: Every day | ORAL | Status: DC

## 2015-08-24 MED ORDER — AMLODIPINE BESYLATE 10 MG PO TABS
10.0000 mg | ORAL_TABLET | Freq: Every day | ORAL | Status: DC
Start: 2015-08-24 — End: 2016-02-12

## 2015-08-24 MED ORDER — BENAZEPRIL HCL 40 MG PO TABS
40.0000 mg | ORAL_TABLET | Freq: Every day | ORAL | Status: DC
Start: 2015-08-24 — End: 2016-02-12

## 2015-08-24 MED ORDER — METFORMIN HCL 1000 MG PO TABS: 1000.0000 mg | ORAL_TABLET | Freq: Two times a day (BID) | ORAL | Status: DC

## 2015-08-24 NOTE — Progress Notes (Signed)
Name: Michelle Montgomery   MRN: [id number]    DOB: 19-Aug-1950   Date:08/24/2015       Progress Note  Subjective  Chief Complaint  Chief Complaint  Patient presents with  . Medication Refill    4 month F/U  . Diabetes    Checks every morning Average-120 High-158, has been elevated due to eating bad due to having multiple birthday parties.   . Hypertension  . Hyperlipidemia  . Insomnia    Patient ran out of medication a month ago and since then has had trouble sleeping will only sleep every hour then will wake up. But with medication she sleeps 8 hours with no interruption   . Gastroesophageal Reflux    HPI  DMII with nephropathy:  She has  not been compliant with her diet over the past month. She likes sweets.  Fsbs is around 120's - usually fasting  She denies  Polyphagia but has  polydipsia and polyuria . No blurred vision. Taking Metformin as prescribed, but not taking Glipizide. Also taking Benazepril ( Ace ) as prescribed. Eye exam is up to date  HTN: taking bp medication as prescribed, denies chest pain, palpitation or SOB . BP is at goal, denies side effects.   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 Seen by Dr. Richardson Landry in the past and had [facility] maneuvers. Last episode was about one month ago, she was on vacation and lasted one week, but has resolved since. She states vertigo triggered by head movement,  - moving to the right. Associated nausea and vomiting. Doing well now.   Hyperlipidemia: taking Atorvastatin, she denies myalgia. No longer on [place]  Insomnia: taking Ambien prn, states it helps her fall asleep and stay asleep. Denies side effects  Lack of sense of smell and taste: symptoms started about about one year ago. She still can't smell dirty diapers, skunk smell or her dogs bad breath. She saw ENT and is on a different nasal steroid and states she is smelling a little better now. He suggested MRI brain but she decided to hold off    GERD: taking Omeprazole, and no heartburn or regurgitation noticed.   Obesity: she was on [place], she states she likes sweets.   OA: she is doing very well on Meloxicam prn, takes for about one week and stops for days. No side effects  Patient Active Problem List   Diagnosis Date Noted  . Vertigo, benign paroxysmal 08/24/2015  . Smell disturbance 01/16/2015  . Osteoarthritis 11/03/2014  . Cramps of lower extremity 11/03/2014  . Neuralgia neuritis, sciatic nerve 11/03/2014  . Insomnia, persistent 11/03/2014  . Dyslipidemia 11/03/2014  . Type 2 diabetes with nephropathy ([place]) 11/03/2014  . Essential (primary) hypertension 11/03/2014  . History of colon polyps 11/03/2014  . H/O malignant neoplasm of skin 11/03/2014  . Microalbuminuria 11/03/2014  . Obesity, morbid ([place]) 11/03/2014  . Osteopenia 11/03/2014  . Plantar fasciitis 11/03/2014  . Gastro-esophageal reflux disease without esophagitis 11/03/2014  . Bundle branch block, right 11/03/2014    Past Surgical History  Procedure Laterality Date  . Excision of breast biopsy Left 11/07/2013    Negative for Cancer  . Foot surgery Left 08/29/2011    Spur Removal , and Achilles Tendon Tendolysis  . Varicose vein surgery Bilateral     Family History  Problem Relation Age of Onset  . Cancer Mother     Colon  . Anemia Mother   .  Cancer Father     Esophageal  . Diabetes Brother   . Cancer Maternal Uncle     Colon  . Cancer Maternal Grandmother     Colon    Social History   Social History  . Marital Status: Married    Spouse Name: N/A  . Number of Children: N/A  . Years of Education: N/A   Occupational History  . Not on file.   Social History Main Topics  . Smoking status: Former Smoker -- 12.00 packs/day    Types: Cigarettes    Quit date: 02/10/1970  . Smokeless tobacco: Never Used  . Alcohol Use: No  . Drug Use: No  . Sexual Activity: Yes   Other Topics Concern  . Not on file   Social  History Narrative     Current outpatient prescriptions:  .  amLODipine (NORVASC) 10 MG tablet, Take 1 tablet (10 mg total) by mouth daily., Disp: 90 tablet, Rfl: 1 .  aspirin 81 MG tablet, Take 1 tablet by mouth daily., Disp: , Rfl:  .  atenolol-chlorthalidone (TENORETIC) 50-25 MG tablet, Take 1 tablet by mouth daily., Disp: 90 tablet, Rfl: 1 .  atorvastatin (LIPITOR) 40 MG tablet, Take 1 tablet (40 mg total) by mouth daily., Disp: 90 tablet, Rfl: 1 .  benazepril (LOTENSIN) 40 MG tablet, Take 1 tablet (40 mg total) by mouth daily., Disp: 90 tablet, Rfl: 1 .  CALCIUM CARBONATE-VIT D-MIN PO, Take 1 tablet by mouth daily., Disp: , Rfl:  .  Cholecalciferol (VITAMIN D) 2000 UNITS CAPS, Take 1 tablet by mouth daily., Disp: , Rfl:  .  glipiZIDE (GLUCOTROL XL) 2.5 MG 24 hr tablet, Take 1 tablet (2.5 mg total) by mouth daily with breakfast., Disp: 90 tablet, Rfl: 0 .  meloxicam (MOBIC) 15 MG tablet, Take 1 tablet (15 mg total) by mouth daily., Disp: 90 tablet, Rfl: 0 .  metFORMIN (GLUCOPHAGE) 1000 MG tablet, Take 1 tablet (1,000 mg total) by mouth 2 (two) times daily with a meal., Disp: 180 tablet, Rfl: 1 .  Omega-3 Fatty Acids (FISH OIL CONCENTRATE) 435 MG CAPS, Take 1 tablet by mouth daily., Disp: , Rfl:  .  omeprazole (PRILOSEC) 20 MG capsule, Take 1 capsule (20 mg total) by mouth daily., Disp: 90 capsule, Rfl: 4 .  triamcinolone (NASACORT AQ) 55 MCG/ACT AERO nasal inhaler, Place 2 sprays into the nose daily., Disp: 1 Inhaler, Rfl: 0 .  zolpidem (AMBIEN) 5 MG tablet, TAKE 1 TABLET EVERY EVENING, Disp: 90 tablet, Rfl: 0  Allergies  Allergen Reactions  . Codeine   . Penicillins      ROS  Constitutional: Negative for fever or weight change.  Respiratory: Negative for cough and shortness of breath.   Cardiovascular: Negative for chest pain or palpitations.   Gastrointestinal: Negative for abdominal pain, no bowel changes.  Musculoskeletal: Negative for gait problem or joint swelling.  Skin:  Negative for rash.  Neurological: Negative for dizziness or headache.  No other specific complaints in a complete review of systems (except as listed in HPI above).  Objective  Filed Vitals:   08/24/15 0840  BP: 126/74  Pulse: 70  Temp: 98.2 F (36.8 C)  TempSrc: Oral  Resp: 16  Height: 5\' 10"  (1.778 m)  Weight: 220 lb 1.6 oz (99.837 kg)  SpO2: 97%    Body mass index is 31.58 kg/(m^2).  Physical Exam  Constitutional: Patient appears well-developed and well-nourished. Obese  No distress.  HEENT: head atraumatic, normocephalic, pupils equal and reactive to  light, ears TM's neck supple, throat within normal limits Cardiovascular: Normal rate, regular rhythm and normal heart sounds.  No murmur heard. No BLE edema. Pulmonary/Chest: Effort normal and breath sounds normal. No respiratory distress. Abdominal: Soft.  There is no tenderness. Psychiatric: Patient has a normal mood and affect. behavior is normal. Judgment and thought content normal. Muscular Skeletal: crepitus with extension of both knees.   Recent Results (from the past 2160 hour(s))  POCT HgB A1C     Status: Abnormal   Collection Time: 08/24/15  8:43 AM  Result Value Ref Range   Hemoglobin A1C 8.1      PHQ2/9: Depression screen [facility] 2/9 08/24/2015 04/10/2015 01/16/2015 11/06/2014  Decreased Interest 0 0 0 0  Down, Depressed, Hopeless 0 0 0 0  PHQ - 2 Score 0 0 0 0    Fall Risk: Fall Risk  08/24/2015 04/10/2015 01/16/2015 11/06/2014  Falls in the past year? No No No No   Functional Status Survey: Is the patient deaf or have difficulty hearing?: No Does the patient have difficulty seeing, even when wearing glasses/contacts?: No Does the patient have difficulty concentrating, remembering, or making decisions?: No Does the patient have difficulty walking or climbing stairs?: No Does the patient have difficulty dressing or bathing?: No Does the patient have difficulty doing errands alone such as visiting a doctor's  office or shopping?: No    Assessment & Plan  1. Type 2 diabetes with nephropathy ([facility])  - POCT HgB A1C - metFORMIN (GLUCOPHAGE) 1000 MG tablet; Take 1 tablet (1,000 mg total) by mouth 2 (two) times daily with a meal.  Dispense: 180 tablet; Refill: 1 She needs to take glipizide XL daily instead of prn, needs to resume diet  2. Vertigo, benign paroxysmal, right  Follow up with ENT prn   3. Insomnia, persistent  Continue prn medication   4. Primary osteoarthritis involving multiple joints  Doing well on prn meloxicam, she takes it for about one week and stops for about one week  5. Microalbuminuria  - benazepril (LOTENSIN) 40 MG tablet; Take 1 tablet (40 mg total) by mouth daily.  Dispense: 90 tablet; Refill: 1  6. Essential (primary) hypertension  - benazepril (LOTENSIN) 40 MG tablet; Take 1 tablet (40 mg total) by mouth daily.  Dispense: 90 tablet; Refill: 1 - atenolol-chlorthalidone (TENORETIC) 50-25 MG tablet; Take 1 tablet by mouth daily.  Dispense: 90 tablet; Refill: 1 - amLODipine (NORVASC) 10 MG tablet; Take 1 tablet (10 mg total) by mouth daily.  Dispense: 90 tablet; Refill: 1 - COMPLETE METABOLIC PANEL WITH GFR  7. Dyslipidemia  - atorvastatin (LIPITOR) 40 MG tablet; Take 1 tablet (40 mg total) by mouth daily.  Dispense: 90 tablet; Refill: 1 - Lipid panel - COMPLETE METABOLIC PANEL WITH GFR  8. Morbid obesity, unspecified obesity type [facility])  Discussed with the patient the risk posed by an increased BMI. Discussed importance of portion control, calorie counting and at least 150 minutes of physical activity weekly. Avoid sweet beverages and drink more water. Eat at least 6 servings of fruit and vegetables daily   9. Gastro-esophageal reflux disease without esophagitis  Doing well at this time  10. Smell disturbance  Seen by ENT  11. History of long-term treatment with high-risk medication  - COMPLETE METABOLIC PANEL WITH GFR

## 2015-10-24 ENCOUNTER — Other Ambulatory Visit: Payer: Self-pay

## 2015-10-24 NOTE — Telephone Encounter (Signed)
Patient requesting refill of Ambien and printed out to be filled at [facility]. Patient states she spoke to Dr. Ancil Boozer about her age would only be covered by her Insurance one prescription once a year. Patient will have to pay out of pocket. 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 Patient will be leaving November 02, 2015 and needs them before going. Patient had her last prescription of Ambien filled July 27, 2015.

## 2015-10-25 MED ORDER — ZOLPIDEM TARTRATE 5 MG PO TABS: 5.0000 mg | ORAL_TABLET | Freq: Every evening | ORAL | 0 refills | Status: DC

## 2016-01-14 ENCOUNTER — Ambulatory Visit: Payer: Medicare PPO | Admitting: Family Medicine

## 2016-02-12 ENCOUNTER — Ambulatory Visit (INDEPENDENT_AMBULATORY_CARE_PROVIDER_SITE_OTHER): Payer: Medicare PPO | Admitting: Family Medicine

## 2016-02-12 ENCOUNTER — Encounter: Payer: Self-pay | Admitting: Family Medicine

## 2016-02-12 VITALS — BP 123/69 | HR 69 | Temp 98.1°F | Resp 15 | Ht 70.0 in | Wt 222.5 lb

## 2016-02-12 DIAGNOSIS — K219 Gastro-esophageal reflux disease without esophagitis: Secondary | ICD-10-CM

## 2016-02-12 DIAGNOSIS — M150 Primary generalized (osteo)arthritis: Secondary | ICD-10-CM

## 2016-02-12 DIAGNOSIS — R809 Proteinuria, unspecified: Secondary | ICD-10-CM | POA: Diagnosis not present

## 2016-02-12 DIAGNOSIS — E785 Hyperlipidemia, unspecified: Secondary | ICD-10-CM | POA: Diagnosis not present

## 2016-02-12 DIAGNOSIS — E1129 Type 2 diabetes mellitus with other diabetic kidney complication: Secondary | ICD-10-CM

## 2016-02-12 DIAGNOSIS — I10 Essential (primary) hypertension: Secondary | ICD-10-CM

## 2016-02-12 DIAGNOSIS — G4700 Insomnia, unspecified: Secondary | ICD-10-CM

## 2016-02-12 DIAGNOSIS — M159 Polyosteoarthritis, unspecified: Secondary | ICD-10-CM

## 2016-02-12 LAB — POCT GLYCOSYLATED HEMOGLOBIN (HGB A1C): Hemoglobin A1C: 8.6

## 2016-02-12 LAB — GLUCOSE, POCT (MANUAL RESULT ENTRY): POC GLUCOSE: 175 mg/dL — AB (ref 70–99)

## 2016-02-12 MED ORDER — MELOXICAM 15 MG PO TABS: 15.0000 mg | ORAL_TABLET | Freq: Every day | ORAL | 0 refills | Status: DC

## 2016-02-12 MED ORDER — DAPAGLIFLOZIN PRO-METFORMIN ER 5-1000 MG PO TB24: 2.0000 | ORAL_TABLET | Freq: Every day | ORAL | 0 refills | Status: DC

## 2016-02-12 MED ORDER — ZOLPIDEM TARTRATE 5 MG PO TABS: 5.0000 mg | ORAL_TABLET | Freq: Every evening | ORAL | 0 refills | Status: DC

## 2016-02-12 MED ORDER — BENAZEPRIL HCL 40 MG PO TABS: 40.0000 mg | ORAL_TABLET | Freq: Every day | ORAL | 1 refills | Status: DC

## 2016-02-12 MED ORDER — AMLODIPINE BESYLATE 10 MG PO TABS: 10.0000 mg | ORAL_TABLET | Freq: Every day | ORAL | 1 refills | Status: DC

## 2016-02-12 MED ORDER — ATENOLOL-CHLORTHALIDONE 50-25 MG PO TABS: 1.0000 | ORAL_TABLET | Freq: Every day | ORAL | 1 refills | Status: DC

## 2016-02-12 MED ORDER — ATORVASTATIN CALCIUM 40 MG PO TABS: 40.0000 mg | ORAL_TABLET | Freq: Every day | ORAL | 1 refills | Status: DC

## 2016-02-12 MED ORDER — OMEPRAZOLE 20 MG PO CPDR: 20.0000 mg | DELAYED_RELEASE_CAPSULE | Freq: Every day | ORAL | 4 refills | Status: DC

## 2016-02-12 NOTE — Progress Notes (Signed)
Name: Michelle Montgomery   MRN: [id number]    DOB: May 31, 1950   Date:02/12/2016       Progress Note  Subjective  Chief Complaint  Chief Complaint  Patient presents with  . Follow-up    4 mo  . Diabetes  . Medication Refill    HPI  DMII with nephropathy:  She has  not been compliant with her diet over the past month, hgbA1C has gone up again, discussed long term complications of uncontrolled diabetes. She likes sweets.  Fsbs  Around 140's fasting  She denies  Polyphagia but has  polydipsia and polyuria . No blurred vision. Taking Metformin as prescribed, but not taking Glipizide on a regular basis. Also taking Benazepril ( Ace ) as prescribed for microalbuminuria. Eye exam is up to date  HTN: taking bp medication as prescribed, denies chest pain, palpitation or SOB . BP is at goal, denies side effects.   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 Seen by Dr. Richardson Landry in the past and had [facility] maneuvers. Last episode was about one month ago, she was on vacation and lasted one week, but has resolved since. She states vertigo triggered by head movement,  - moving to the right. Associated nausea and vomiting. She states last episode was after she [place]. Doing well at this time  Hyperlipidemia: taking Atorvastatin, she denies myalgia.   Insomnia: taking Ambien prn, states it helps her fall asleep and stay asleep. Denies side effects, discussed risk and she states she does not want to stop medication  Lack of sense of smell and taste: symptoms started about about one year ago. She still can't smell dirty diapers, skunk smell or her dogs bad breath. She saw ENT and is on a different nasal steroid and states she is smelling a little better now. He suggested MRI brain but she decided to hold off  She states she has noticed mild improvement lately, but she still does not want to have MRI  GERD: taking Omeprazole, and no heartburn or  regurgitation noticed.   Obesity: she has been off her diet and has gained some weight since last visit.   OA: she is doing very well on Meloxicam prn, takes for about one week and stops for days. No side effects, last refill was almost one year ago  Patient Active Problem List   Diagnosis Date Noted  . Vertigo, benign paroxysmal 08/24/2015  . Smell disturbance 01/16/2015  . Osteoarthritis 11/03/2014  . Cramps of lower extremity 11/03/2014  . Neuralgia neuritis, sciatic nerve 11/03/2014  . Insomnia, persistent 11/03/2014  . Dyslipidemia 11/03/2014  . Type 2 diabetes with nephropathy ([place]) 11/03/2014  . Essential (primary) hypertension 11/03/2014  . History of colon polyps 11/03/2014  . H/O malignant neoplasm of skin 11/03/2014  . Microalbuminuria 11/03/2014  . Obesity, morbid ([place]) 11/03/2014  . Osteopenia 11/03/2014  . Plantar fasciitis 11/03/2014  . Gastro-esophageal reflux disease without esophagitis 11/03/2014  . Bundle branch block, right 11/03/2014    Past Surgical History:  Procedure Laterality Date  . EXCISION OF BREAST BIOPSY Left 11/07/2013   Negative for Cancer  . FOOT SURGERY Left 08/29/2011   Spur Removal , and Achilles Tendon Tendolysis  . VARICOSE VEIN SURGERY Bilateral     Family History  Problem Relation Age of Onset  . Cancer Mother     Colon  . Anemia Mother   . Cancer Father     Esophageal  .  Diabetes Brother   . Cancer Maternal Uncle     Colon  . Cancer Maternal Grandmother     Colon    Social History   Social History  . Marital status: Married    Spouse name: N/A  . Number of children: N/A  . Years of education: N/A   Occupational History  . Not on file.   Social History Main Topics  . Smoking status: Former Smoker    Packs/day: 12.00    Types: Cigarettes    Quit date: 02/10/1970  . Smokeless tobacco: Never Used  . Alcohol use No  . Drug use: No  . Sexual activity: Yes   Other Topics Concern  . Not on file   Social  History Narrative  . No narrative on file     Current Outpatient Prescriptions:  .  amLODipine (NORVASC) 10 MG tablet, Take 1 tablet (10 mg total) by mouth daily., Disp: 90 tablet, Rfl: 1 .  aspirin 81 MG tablet, Take 1 tablet by mouth daily., Disp: , Rfl:  .  atenolol-chlorthalidone (TENORETIC) 50-25 MG tablet, Take 1 tablet by mouth daily., Disp: 90 tablet, Rfl: 1 .  atorvastatin (LIPITOR) 40 MG tablet, Take 1 tablet (40 mg total) by mouth daily., Disp: 90 tablet, Rfl: 1 .  benazepril (LOTENSIN) 40 MG tablet, Take 1 tablet (40 mg total) by mouth daily., Disp: 90 tablet, Rfl: 1 .  CALCIUM CARBONATE-VIT D-MIN PO, Take 1 tablet by mouth daily., Disp: , Rfl:  .  Cholecalciferol (VITAMIN D) 2000 UNITS CAPS, Take 1 tablet by mouth daily., Disp: , Rfl:  .  meloxicam (MOBIC) 15 MG tablet, Take 1 tablet (15 mg total) by mouth daily., Disp: 90 tablet, Rfl: 0 .  Omega-3 Fatty Acids (FISH OIL CONCENTRATE) 435 MG CAPS, Take 1 tablet by mouth daily., Disp: , Rfl:  .  omeprazole (PRILOSEC) 20 MG capsule, Take 1 capsule (20 mg total) by mouth daily., Disp: 90 capsule, Rfl: 4 .  triamcinolone (NASACORT AQ) 55 MCG/ACT AERO nasal inhaler, Place 2 sprays into the nose daily., Disp: 1 Inhaler, Rfl: 0 .  zolpidem (AMBIEN) 5 MG tablet, Take 1 tablet (5 mg total) by mouth every evening., Disp: 90 tablet, Rfl: 0 .  Dapagliflozin-Metformin HCl ER (XIGDUO XR) 06-998 MG TB24, Take 2 tablets by mouth daily., Disp: 180 tablet, Rfl: 0  Allergies  Allergen Reactions  . Codeine   . Penicillins      ROS  Constitutional: Negative for fever or weight change.  Respiratory: Negative for cough and shortness of breath.   Cardiovascular: Negative for chest pain or palpitations.  Gastrointestinal: Negative for abdominal pain, no bowel changes.  Musculoskeletal: Negative for gait problem or joint swelling.  Skin: Negative for rash.  Neurological: Negative for dizziness or headache.  No other specific complaints in a  complete review of systems (except as listed in HPI above).  Objective  Vitals:   02/12/16 1006  BP: 123/69  Pulse: 69  Resp: 15  Temp: 98.1 F (36.7 C)  TempSrc: Oral  SpO2: 96%  Weight: 222 lb 8 oz (100.9 kg)  Height: 5\' 10"  (1.778 m)    Body mass index is 31.93 kg/m.  Physical Exam  Constitutional: Patient appears well-developed and well-nourished. Obese  No distress.  HEENT: head atraumatic, normocephalic, pupils equal and reactive to light,  neck supple, throat within normal limits Cardiovascular: Normal rate, regular rhythm and normal heart sounds.  No murmur heard. No BLE edema. Pulmonary/Chest: Effort normal and breath sounds  normal. No respiratory distress. Abdominal: Soft.  There is no tenderness. Psychiatric: Patient has a normal mood and affect. behavior is normal. Judgment and thought content normal.  Recent Results (from the past 2160 hour(s))  POCT HgB A1C     Status: Abnormal   Collection Time: 02/12/16 10:12 AM  Result Value Ref Range   Hemoglobin A1C 8.6   POCT Glucose (CBG)     Status: Abnormal   Collection Time: 02/12/16 10:12 AM  Result Value Ref Range   POC Glucose 175 (A) 70 - 99 mg/dl    Diabetic Foot Exam: Diabetic Foot Exam - Simple   Simple Foot Form Diabetic Foot exam was performed with the following findings:  Yes 02/12/2016 10:45 AM  Visual Inspection No deformities, no ulcerations, no other skin breakdown bilaterally:  Yes Sensation Testing Intact to touch and monofilament testing bilaterally:  Yes Pulse Check Posterior Tibialis and Dorsalis pulse intact bilaterally:  Yes Comments      PHQ2/9: Depression screen [facility] 2/9 02/12/2016 08/24/2015 04/10/2015 01/16/2015 11/06/2014  Decreased Interest 0 0 0 0 0  Down, Depressed, Hopeless 0 0 0 0 0  PHQ - 2 Score 0 0 0 0 0     Fall Risk: Fall Risk  02/12/2016 08/24/2015 04/10/2015 01/16/2015 11/06/2014  Falls in the past year? No No No No No     Functional Status Survey: Is the patient deaf  or have difficulty hearing?: No Does the patient have difficulty seeing, even when wearing glasses/contacts?: Yes (glasses) Does the patient have difficulty concentrating, remembering, or making decisions?: No Does the patient have difficulty walking or climbing stairs?: No Does the patient have difficulty dressing or bathing?: No Does the patient have difficulty doing errands alone such as visiting a doctor's office or shopping?: No   Assessment & Plan  1. Diabetes mellitus with microalbuminuria (HCC)  Not controlled on Metformin and glucotrol, discussed options and we will try changing to Xigduo at this time - POCT HgB A1C - POCT Glucose (CBG) - Dapagliflozin-Metformin HCl ER (XIGDUO XR) 06-998 MG TB24; Take 2 tablets by mouth daily.  Dispense: 180 tablet; Refill: 0 - Microalbumin, urine  2. Insomnia, persistent  Explained controlled medication, and she will need to make it last until follow up - zolpidem (AMBIEN) 5 MG tablet; Take 1 tablet (5 mg total) by mouth every evening.  Dispense: 90 tablet; Refill: 0  3. Essential (primary) hypertension  - amLODipine (NORVASC) 10 MG tablet; Take 1 tablet (10 mg total) by mouth daily.  Dispense: 90 tablet; Refill: 1 - atenolol-chlorthalidone (TENORETIC) 50-25 MG tablet; Take 1 tablet by mouth daily.  Dispense: 90 tablet; Refill: 1 - benazepril (LOTENSIN) 40 MG tablet; Take 1 tablet (40 mg total) by mouth daily.  Dispense: 90 tablet; Refill: 1  4. Dyslipidemia  - atorvastatin (LIPITOR) 40 MG tablet; Take 1 tablet (40 mg total) by mouth daily.  Dispense: 90 tablet; Refill: 1  5. Obesity, morbid ([place])  Discussed with the patient the risk posed by an increased BMI. Discussed importance of portion control, calorie counting and at least 150 minutes of physical activity weekly. Avoid sweet beverages and drink more water. Eat at least 6 servings of fruit and vegetables daily   6. Microalbuminuria  - benazepril (LOTENSIN) 40 MG tablet; Take 1  tablet (40 mg total) by mouth daily.  Dispense: 90 tablet; Refill: 1  7. Primary osteoarthritis involving multiple joints  - meloxicam (MOBIC) 15 MG tablet; Take 1 tablet (15 mg total) by mouth daily.  Dispense: 90 tablet; Refill: 0  8. Gastro-esophageal reflux disease without esophagitis  - omeprazole (PRILOSEC) 20 MG capsule; Take 1 capsule (20 mg total) by mouth daily.  Dispense: 90 capsule; Refill: 4

## 2016-02-13 LAB — MICROALBUMIN, URINE: MICROALB UR: 0.9 mg/dL

## 2016-05-01 ENCOUNTER — Telehealth: Payer: Self-pay | Admitting: Family Medicine

## 2016-05-01 NOTE — Telephone Encounter (Signed)
Pt called and states she needs a call back about her Type DM medications. Please give the patient a call back so she can explain. Cell # [phone]

## 2016-05-01 NOTE — Telephone Encounter (Signed)
Patient called and states the Michelle Montgomery compound is too expensive. She would like to see if you could place her back on metformin and glipizide due to them being free through her [place] mail order.

## 2016-05-05 NOTE — Telephone Encounter (Signed)
Left voicemail for pt to schedule appointment to come in and discuss medications

## 2016-05-05 NOTE — Telephone Encounter (Signed)
The previous medications did not work for her, it would be best if she could come in to discuss other options. All branded medications will be very expensive and generics did not work

## 2016-06-12 ENCOUNTER — Other Ambulatory Visit: Payer: Self-pay | Admitting: Family Medicine

## 2016-06-12 ENCOUNTER — Encounter: Payer: Self-pay | Admitting: Family Medicine

## 2016-06-12 ENCOUNTER — Ambulatory Visit (INDEPENDENT_AMBULATORY_CARE_PROVIDER_SITE_OTHER): Payer: Medicare PPO | Admitting: Family Medicine

## 2016-06-12 VITALS — BP 118/68 | HR 66 | Temp 98.3°F | Resp 16 | Ht 70.0 in | Wt 219.5 lb

## 2016-06-12 DIAGNOSIS — Z1231 Encounter for screening mammogram for malignant neoplasm of breast: Secondary | ICD-10-CM

## 2016-06-12 DIAGNOSIS — E1129 Type 2 diabetes mellitus with other diabetic kidney complication: Secondary | ICD-10-CM

## 2016-06-12 DIAGNOSIS — R809 Proteinuria, unspecified: Secondary | ICD-10-CM | POA: Diagnosis not present

## 2016-06-12 DIAGNOSIS — G4700 Insomnia, unspecified: Secondary | ICD-10-CM | POA: Diagnosis not present

## 2016-06-12 DIAGNOSIS — I10 Essential (primary) hypertension: Secondary | ICD-10-CM | POA: Diagnosis not present

## 2016-06-12 DIAGNOSIS — M159 Polyosteoarthritis, unspecified: Secondary | ICD-10-CM

## 2016-06-12 DIAGNOSIS — E785 Hyperlipidemia, unspecified: Secondary | ICD-10-CM

## 2016-06-12 DIAGNOSIS — I8393 Asymptomatic varicose veins of bilateral lower extremities: Secondary | ICD-10-CM

## 2016-06-12 DIAGNOSIS — K219 Gastro-esophageal reflux disease without esophagitis: Secondary | ICD-10-CM | POA: Diagnosis not present

## 2016-06-12 DIAGNOSIS — H8111 Benign paroxysmal vertigo, right ear: Secondary | ICD-10-CM

## 2016-06-12 DIAGNOSIS — M150 Primary generalized (osteo)arthritis: Secondary | ICD-10-CM

## 2016-06-12 LAB — POCT GLYCOSYLATED HEMOGLOBIN (HGB A1C): HEMOGLOBIN A1C: 8

## 2016-06-12 MED ORDER — ZOLPIDEM TARTRATE 5 MG PO TABS: 5.0000 mg | ORAL_TABLET | Freq: Every evening | ORAL | 0 refills | Status: DC

## 2016-06-12 MED ORDER — BENAZEPRIL HCL 40 MG PO TABS: 40.0000 mg | ORAL_TABLET | Freq: Every day | ORAL | 1 refills | Status: DC

## 2016-06-12 MED ORDER — PIOGLITAZONE HCL 15 MG PO TABS: 15.0000 mg | ORAL_TABLET | Freq: Every day | ORAL | 1 refills | Status: DC

## 2016-06-12 MED ORDER — METFORMIN HCL 1000 MG PO TABS: 1000.0000 mg | ORAL_TABLET | Freq: Two times a day (BID) | ORAL | 1 refills | Status: DC

## 2016-06-12 MED ORDER — ATORVASTATIN CALCIUM 40 MG PO TABS: 40.0000 mg | ORAL_TABLET | Freq: Every day | ORAL | 1 refills | Status: DC

## 2016-06-12 MED ORDER — AMLODIPINE BESYLATE 10 MG PO TABS: 10.0000 mg | ORAL_TABLET | Freq: Every day | ORAL | 1 refills | Status: DC

## 2016-06-12 MED ORDER — MELOXICAM 15 MG PO TABS: 15.0000 mg | ORAL_TABLET | Freq: Every day | ORAL | 0 refills | Status: DC

## 2016-06-12 MED ORDER — ATENOLOL-CHLORTHALIDONE 50-25 MG PO TABS: 1.0000 | ORAL_TABLET | Freq: Every day | ORAL | 1 refills | Status: DC

## 2016-06-12 NOTE — Progress Notes (Signed)
Name: Michelle Montgomery   MRN: [id number]    DOB: 26-May-1950   Date:06/12/2016       Progress Note  Subjective  Chief Complaint  Chief Complaint  Patient presents with  . Diabetes    daily avg from 120-140  . Insomnia    HPI  DMII with nephropathy: She is doing better with her diet, hgbA1C has improved, but still not at goal.  Discussed long term complications of uncontrolled diabetes. She likes sweets, but advised fruit instead of dessert. She states her glucose at home has been well controlled, 120's-130's fasting, but this morning it was 170. She has polyphagia, polydipsia and polyuria. Also taking Benazepril ( Ace ) as prescribed for microalbuminuria. Eye exam is due.   HTN: taking bp medication as prescribed, denies chest pain, palpitation, edema or SOB . BP is at goal, denies side effects.   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 Seen by Dr. Richardson Landry in the past and had [facility] maneuvers. Last episode was about one month ago, she was on vacation and lasted one week, but has resolved since. She states vertigo triggered by head movement, - moving to the right. Associated nausea and vomiting. She states last episode [place]   Hyperlipidemia: taking Atorvastatin, she denies myalgia. She will have labs next visit  Insomnia: taking Ambien prn, states it helps her fall asleep and stay asleep. Denies side effects, discussed risk and she states she does not want to stop medication  Lack of sense of smell and taste: symptoms started about about one year ago. She still can't smell dirty diapers, skunk smell or her dogs bad breath. She saw ENT and is on a different nasal steroid and states she is smelling a little better now. He suggested MRI brain but she decided to hold off  She states she has noticed mild improvement lately, but she still does not want to have MRI  GERD: taking Omeprazole, and no heartburn or  regurgitation noticed. Discussed long term use of PPI, dementia, cardiovascular disease, dementia and osteoporosis.   Obesity: she has lost a few pounds since last visit, she has been doing better on her diet  OA: she is doing very well on Meloxicam prn, takes for about one week and stops for days. No side effects.   Patient Active Problem List   Diagnosis Date Noted  . BPV (benign positional vertigo), right 08/24/2015  . Smell disturbance 01/16/2015  . Osteoarthritis 11/03/2014  . Cramps of lower extremity 11/03/2014  . Neuralgia neuritis, sciatic nerve 11/03/2014  . Insomnia, persistent 11/03/2014  . Dyslipidemia 11/03/2014  . Type 2 diabetes with nephropathy ([place]) 11/03/2014  . Essential (primary) hypertension 11/03/2014  . History of colon polyps 11/03/2014  . H/O malignant neoplasm of skin 11/03/2014  . Microalbuminuria 11/03/2014  . Obesity, morbid ([place]) 11/03/2014  . Osteopenia 11/03/2014  . Plantar fasciitis 11/03/2014  . Gastro-esophageal reflux disease without esophagitis 11/03/2014  . Bundle branch block, right 11/03/2014    Past Surgical History:  Procedure Laterality Date  . EXCISION OF BREAST BIOPSY Left 11/07/2013   Negative for Cancer  . FOOT SURGERY Left 08/29/2011   Spur Removal , and Achilles Tendon Tendolysis  . VARICOSE VEIN SURGERY Bilateral     Family History  Problem Relation Age of Onset  . Cancer Mother     Colon  . Anemia Mother   . Cancer Father  Esophageal  . Diabetes Brother   . Cancer Maternal Uncle     Colon  . Cancer Maternal Grandmother     Colon    Social History   Social History  . Marital status: Married    Spouse name: N/A  . Number of children: N/A  . Years of education: N/A   Occupational History  . Not on file.   Social History Main Topics  . Smoking status: Former Smoker    Packs/day: 12.00    Types: Cigarettes    Quit date: 02/10/1970  . Smokeless tobacco: Never Used  . Alcohol use No  . Drug use: No   . Sexual activity: Yes   Other Topics Concern  . Not on file   Social History Narrative  . No narrative on file     Current Outpatient Prescriptions:  .  amLODipine (NORVASC) 10 MG tablet, Take 1 tablet (10 mg total) by mouth daily., Disp: 90 tablet, Rfl: 1 .  aspirin 81 MG tablet, Take 1 tablet by mouth daily., Disp: , Rfl:  .  atenolol-chlorthalidone (TENORETIC) 50-25 MG tablet, Take 1 tablet by mouth daily., Disp: 90 tablet, Rfl: 1 .  atorvastatin (LIPITOR) 40 MG tablet, Take 1 tablet (40 mg total) by mouth daily., Disp: 90 tablet, Rfl: 1 .  benazepril (LOTENSIN) 40 MG tablet, Take 1 tablet (40 mg total) by mouth daily., Disp: 90 tablet, Rfl: 1 .  CALCIUM CARBONATE-VIT D-MIN PO, Take 1 tablet by mouth daily., Disp: , Rfl:  .  Cholecalciferol (VITAMIN D) 2000 UNITS CAPS, Take 1 tablet by mouth daily., Disp: , Rfl:  .  meloxicam (MOBIC) 15 MG tablet, Take 1 tablet (15 mg total) by mouth daily., Disp: 90 tablet, Rfl: 0 .  metFORMIN (GLUCOPHAGE) 1000 MG tablet, Take 1 tablet (1,000 mg total) by mouth 2 (two) times daily with a meal., Disp: 180 tablet, Rfl: 1 .  Omega-3 Fatty Acids (FISH OIL CONCENTRATE) 435 MG CAPS, Take 1 tablet by mouth daily., Disp: , Rfl:  .  omeprazole (PRILOSEC) 20 MG capsule, Take 1 capsule (20 mg total) by mouth daily., Disp: 90 capsule, Rfl: 4 .  pioglitazone (ACTOS) 15 MG tablet, Take 1 tablet (15 mg total) by mouth daily., Disp: 90 tablet, Rfl: 1 .  triamcinolone (NASACORT AQ) 55 MCG/ACT AERO nasal inhaler, Place 2 sprays into the nose daily., Disp: 1 Inhaler, Rfl: 0 .  zolpidem (AMBIEN) 5 MG tablet, Take 1 tablet (5 mg total) by mouth every evening., Disp: 90 tablet, Rfl: 0  Allergies  Allergen Reactions  . Codeine   . Penicillins      ROS  Constitutional: Negative for fever or significant  weight change.  Respiratory: Negative for cough and shortness of breath.   Cardiovascular: Negative for chest pain or palpitations.  Gastrointestinal: Negative  for abdominal pain, no bowel changes.  Musculoskeletal: Negative for gait problem , positive for joint swelling right knee.  Skin: Negative for rash.  Neurological: Negative for dizziness or headache.  No other specific complaints in a complete review of systems (except as listed in HPI above).  Objective  Vitals:   06/12/16 0906  BP: 118/68  Pulse: 66  Resp: 16  Temp: 98.3 F (36.8 C)  SpO2: 90%  Weight: 219 lb 8 oz (99.6 kg)  Height: 5\' 10"  (1.778 m)    Body mass index is 31.49 kg/m.  Physical Exam  Constitutional: Patient appears well-developed and well-nourished. Obese  No distress.  HEENT: head atraumatic, normocephalic, pupils equal  and reactive to light,  neck supple, throat within normal limits Cardiovascular: Normal rate, regular rhythm and normal heart sounds.  No murmur heard. No BLE edema. 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 No respiratory distress. Abdominal: Soft.  There is no tenderness. Muscular Skeletal: crepitus with extension of both knees, mild effusion right knee Psychiatric: Patient has a normal mood and affect. behavior is normal. Judgment and thought content normal.  PHQ2/9: Depression screen [facility] 2/9 02/12/2016 08/24/2015 04/10/2015 01/16/2015 11/06/2014  Decreased Interest 0 0 0 0 0  Down, Depressed, Hopeless 0 0 0 0 0  PHQ - 2 Score 0 0 0 0 0     Fall Risk: Fall Risk  02/12/2016 08/24/2015 04/10/2015 01/16/2015 11/06/2014  Falls in the past year? No No No No No     Assessment & Plan  1. Diabetes mellitus with microalbuminuria ([place])  She has been taking only Metformin, Xigduo was too expensive, discussed possible risk of edema and bladder cancer, we will add Actos.  - metFORMIN (GLUCOPHAGE) 1000 MG tablet; Take 1 tablet (1,000 mg total) by mouth 2 (two) times daily with a meal.  Dispense: 180 tablet; Refill: 1 - pioglitazone (ACTOS) 15 MG tablet; Take 1 tablet (15 mg total) by  mouth daily.  Dispense: 90 tablet; Refill: 1  2. Insomnia, persistent  - zolpidem (AMBIEN) 5 MG tablet; Take 1 tablet (5 mg total) by mouth every evening.  Dispense: 90 tablet; Refill: 0  3. Essential (primary) hypertension  - amLODipine (NORVASC) 10 MG tablet; Take 1 tablet (10 mg total) by mouth daily.  Dispense: 90 tablet; Refill: 1 - atenolol-chlorthalidone (TENORETIC) 50-25 MG tablet; Take 1 tablet by mouth daily.  Dispense: 90 tablet; Refill: 1 - benazepril (LOTENSIN) 40 MG tablet; Take 1 tablet (40 mg total) by mouth daily.  Dispense: 90 tablet; Refill: 1  4. Dyslipidemia  - atorvastatin (LIPITOR) 40 MG tablet; Take 1 tablet (40 mg total) by mouth daily.  Dispense: 90 tablet; Refill: 1  5. Obesity, morbid ([place])  Discussed with the patient the risk posed by an increased BMI. Discussed importance of portion control, calorie counting and at least 150 minutes of physical activity weekly. Avoid sweet beverages and drink more water. Eat at least 6 servings of fruit and vegetables daily   6. Primary osteoarthritis involving multiple joints  - meloxicam (MOBIC) 15 MG tablet; Take 1 tablet (15 mg total) by mouth daily.  Dispense: 90 tablet; Refill: 0  7. Gastro-esophageal reflux disease without esophagitis  Taking medication and is doing well  8. Microalbuminuria  - benazepril (LOTENSIN) 40 MG tablet; Take 1 tablet (40 mg total) by mouth daily.  Dispense: 90 tablet; Refill: 1  9. BPV (benign positional vertigo), right

## 2016-06-12 NOTE — Patient Instructions (Signed)
Check with insurance how much GLP-1 agonist costs  Names: Victoza, Trulicity, Ozempic or Bydureon.

## 2016-07-17 ENCOUNTER — Ambulatory Visit
Admission: RE | Admit: 2016-07-17 | Discharge: 2016-07-17 | Disposition: A | Discharge status: Home / Self Care | Payer: Medicare PPO | Source: Ambulatory Visit | Attending: Family Medicine | Admitting: Family Medicine

## 2016-07-17 DIAGNOSIS — Z1231 Encounter for screening mammogram for malignant neoplasm of breast: Secondary | ICD-10-CM | POA: Insufficient documentation

## 2016-09-15 ENCOUNTER — Ambulatory Visit: Payer: Medicare PPO | Admitting: Family Medicine

## 2016-10-29 ENCOUNTER — Ambulatory Visit (INDEPENDENT_AMBULATORY_CARE_PROVIDER_SITE_OTHER): Payer: Medicare PPO | Admitting: Family Medicine

## 2016-10-29 ENCOUNTER — Encounter: Payer: Self-pay | Admitting: Family Medicine

## 2016-10-29 VITALS — BP 124/74 | HR 64 | Temp 98.6°F | Resp 16 | Wt 220.1 lb

## 2016-10-29 DIAGNOSIS — E785 Hyperlipidemia, unspecified: Secondary | ICD-10-CM

## 2016-10-29 DIAGNOSIS — S30860A Insect bite (nonvenomous) of lower back and pelvis, initial encounter: Secondary | ICD-10-CM

## 2016-10-29 DIAGNOSIS — R809 Proteinuria, unspecified: Secondary | ICD-10-CM

## 2016-10-29 DIAGNOSIS — E1121 Type 2 diabetes mellitus with diabetic nephropathy: Secondary | ICD-10-CM | POA: Diagnosis not present

## 2016-10-29 DIAGNOSIS — I10 Essential (primary) hypertension: Secondary | ICD-10-CM | POA: Diagnosis not present

## 2016-10-29 DIAGNOSIS — Z23 Encounter for immunization: Secondary | ICD-10-CM

## 2016-10-29 DIAGNOSIS — G4700 Insomnia, unspecified: Secondary | ICD-10-CM | POA: Diagnosis not present

## 2016-10-29 DIAGNOSIS — W57XXXA Bitten or stung by nonvenomous insect and other nonvenomous arthropods, initial encounter: Secondary | ICD-10-CM

## 2016-10-29 LAB — COMPLETE METABOLIC PANEL WITH GFR
AG Ratio: 2.3 (calc) (ref 1.0–2.5)
ALBUMIN MSPROF: 5 g/dL (ref 3.6–5.1)
ALKALINE PHOSPHATASE (APISO): 38 U/L (ref 33–130)
ALT: 32 U/L — ABNORMAL HIGH (ref 6–29)
AST: 25 U/L (ref 10–35)
BUN: 18 mg/dL (ref 7–25)
CALCIUM: 10 mg/dL (ref 8.6–10.4)
CO2: 29 mmol/L (ref 20–32)
Chloride: 100 mmol/L (ref 98–110)
Creat: 0.7 mg/dL (ref 0.50–0.99)
GFR, EST NON AFRICAN AMERICAN: 90 mL/min/{1.73_m2} (ref >=60)
GFR, Est African American: 104 mL/min/{1.73_m2} (ref >=60)
GLOBULIN: 2.2 g/dL (ref 1.9–3.7)
GLUCOSE: 168 mg/dL — AB (ref 65–99)
Potassium: 4.2 mmol/L (ref 3.5–5.3)
SODIUM: 139 mmol/L (ref 135–146)
Total Bilirubin: 0.7 mg/dL (ref 0.2–1.2)
Total Protein: 7.2 g/dL (ref 6.1–8.1)

## 2016-10-29 LAB — LIPID PANEL
CHOL/HDL RATIO: 2.7 (calc) (ref <5.0)
CHOLESTEROL: 111 mg/dL (ref <200)
HDL: 41 mg/dL — AB (ref >50)
LDL CHOLESTEROL (CALC): 47 mg/dL
NON-HDL CHOLESTEROL (CALC): 70 mg/dL (ref <130)
Triglycerides: 148 mg/dL (ref <150)

## 2016-10-29 LAB — POCT GLYCOSYLATED HEMOGLOBIN (HGB A1C): Hemoglobin A1C: 8

## 2016-10-29 MED ORDER — GLIPIZIDE ER 2.5 MG PO TB24: 2.5000 mg | ORAL_TABLET | Freq: Every day | ORAL | 0 refills | Status: DC

## 2016-10-29 MED ORDER — AMLODIPINE BESYLATE 10 MG PO TABS: 10.0000 mg | ORAL_TABLET | Freq: Every day | ORAL | 1 refills | Status: DC

## 2016-10-29 MED ORDER — ATENOLOL-CHLORTHALIDONE 50-25 MG PO TABS: 1.0000 | ORAL_TABLET | Freq: Every day | ORAL | 1 refills | Status: DC

## 2016-10-29 MED ORDER — ZOLPIDEM TARTRATE 5 MG PO TABS: 5.0000 mg | ORAL_TABLET | Freq: Every evening | ORAL | 0 refills | Status: DC

## 2016-10-29 MED ORDER — PIOGLITAZONE HCL 15 MG PO TABS: 15.0000 mg | ORAL_TABLET | Freq: Every day | ORAL | 1 refills | Status: DC

## 2016-10-29 MED ORDER — METFORMIN HCL 1000 MG PO TABS: 1000.0000 mg | ORAL_TABLET | Freq: Two times a day (BID) | ORAL | 1 refills | Status: DC

## 2016-10-29 MED ORDER — ATORVASTATIN CALCIUM 40 MG PO TABS: 40.0000 mg | ORAL_TABLET | Freq: Every day | ORAL | 1 refills | Status: DC

## 2016-10-29 MED ORDER — BENAZEPRIL HCL 40 MG PO TABS: 40.0000 mg | ORAL_TABLET | Freq: Every day | ORAL | 1 refills | Status: DC

## 2016-10-29 NOTE — Progress Notes (Signed)
Name: Michelle Montgomery   MRN: [id number]    DOB: 1950-03-30   Date:10/29/2016       Progress Note  Subjective  Chief Complaint  Chief Complaint  Patient presents with  . Follow-up  . Diabetes  . Insect Bite    tick    HPI   DMII with nephropathy: hgbA1C has been the same for over one year, above 8%, she cannot afford expensive medication, we will try adding Glipizide, discussed risk of hypoglycemia. She likes sweets, but advised fruit instead of dessert. She states her glucose at home has been well controlled, 120's-130's  Fasting. She has polyphagia, polydipsia and polyuria. Also taking Benazepril ( Ace ) as prescribed for microalbuminuria. Eye exam is due.   HTN: taking bp medication as prescribed, denies chest pain, palpitation, edema or SOB . BP is at goal, denies side effects.   Hyperlipidemia: taking Atorvastatin, she denies myalgia. She will have labs today.   Insomnia: taking Ambien prn, states it helps her fall asleep and stay asleep. Denies side effects, discussed risk and she states she does not want to stop medication  Lack of sense of smell and taste: symptoms started about about one year ago. She still can't smell dirty diapers, skunk smell or her dogs bad breath. She saw ENT and is on a different nasal steroid and states she is smelling a little better now. He suggested MRI brain but she decided to hold off She states she has noticed mild improvement lately, but she still does not want to have MRI, she is considering it now.   GERD: taking Omeprazole, and no heartburn or regurgitation noticed. Discussed long term use of PPI, dementia, cardiovascular disease, dementia and osteoporosis. She tried weaning self off and switching to Zantac but symptoms returned, so she is back on PPI  Obesity: her weight is stable, normal appetite, she is active, watching grand- children, she also volunteers.   OA: she is doing very well on Meloxicam prn, takes for about 2 weeks  and stops after that No side effects.  Patient Active Problem List   Diagnosis Date Noted  . Varicose veins of both lower extremities 06/12/2016  . BPV (benign positional vertigo), right 08/24/2015  . Smell disturbance 01/16/2015  . Osteoarthritis 11/03/2014  . Cramps of lower extremity 11/03/2014  . Neuralgia neuritis, sciatic nerve 11/03/2014  . Insomnia, persistent 11/03/2014  . Dyslipidemia 11/03/2014  . Type 2 diabetes with nephropathy ([place]) 11/03/2014  . Essential (primary) hypertension 11/03/2014  . History of colon polyps 11/03/2014  . H/O malignant neoplasm of skin 11/03/2014  . Microalbuminuria 11/03/2014  . Obesity, morbid ([place]) 11/03/2014  . Osteopenia 11/03/2014  . Plantar fasciitis 11/03/2014  . Gastro-esophageal reflux disease without esophagitis 11/03/2014  . Bundle branch block, right 11/03/2014    Past Surgical History:  Procedure Laterality Date  . BREAST BIOPSY Left 2015   benign  . FOOT SURGERY Left 08/29/2011   Spur Removal , and Achilles Tendon Tendolysis  . VARICOSE VEIN SURGERY Bilateral     Family History  Problem Relation Age of Onset  . Cancer Mother        Colon  . Anemia Mother   . Cancer Father        Esophageal  . Diabetes Brother   . Cancer Maternal Uncle        Colon  . Cancer Maternal Grandmother        Colon    Social History   Social History  . Marital  status: Married    Spouse name: N/A  . Number of children: N/A  . Years of education: N/A   Occupational History  . Not on file.   Social History Main Topics  . Smoking status: Former Smoker    Packs/day: 12.00    Types: Cigarettes    Quit date: 02/10/1970  . Smokeless tobacco: Never Used  . Alcohol use No  . Drug use: No  . Sexual activity: Yes   Other Topics Concern  . Not on file   Social History Narrative  . No narrative on file     Current Outpatient Prescriptions:  .  amLODipine (NORVASC) 10 MG tablet, Take 1 tablet (10 mg total) by mouth daily., Disp:  90 tablet, Rfl: 1 .  aspirin 81 MG tablet, Take 1 tablet by mouth daily., Disp: , Rfl:  .  atenolol-chlorthalidone (TENORETIC) 50-25 MG tablet, Take 1 tablet by mouth daily., Disp: 90 tablet, Rfl: 1 .  atorvastatin (LIPITOR) 40 MG tablet, Take 1 tablet (40 mg total) by mouth daily., Disp: 90 tablet, Rfl: 1 .  benazepril (LOTENSIN) 40 MG tablet, Take 1 tablet (40 mg total) by mouth daily., Disp: 90 tablet, Rfl: 1 .  CALCIUM CARBONATE-VIT D-MIN PO, Take 1 tablet by mouth daily., Disp: , Rfl:  .  Cholecalciferol (VITAMIN D) 2000 UNITS CAPS, Take 1 tablet by mouth daily., Disp: , Rfl:  .  meloxicam (MOBIC) 15 MG tablet, Take 1 tablet (15 mg total) by mouth daily. (Patient taking differently: Take 15 mg by mouth as needed. ), Disp: 90 tablet, Rfl: 0 .  metFORMIN (GLUCOPHAGE) 1000 MG tablet, Take 1 tablet (1,000 mg total) by mouth 2 (two) times daily with a meal., Disp: 180 tablet, Rfl: 1 .  Omega-3 Fatty Acids (FISH OIL CONCENTRATE) 435 MG CAPS, Take 1 tablet by mouth daily., Disp: , Rfl:  .  omeprazole (PRILOSEC) 20 MG capsule, Take 1 capsule (20 mg total) by mouth daily., Disp: 90 capsule, Rfl: 4 .  pioglitazone (ACTOS) 15 MG tablet, Take 1 tablet (15 mg total) by mouth daily., Disp: 90 tablet, Rfl: 1 .  triamcinolone (NASACORT AQ) 55 MCG/ACT AERO nasal inhaler, Place 2 sprays into the nose daily., Disp: 1 Inhaler, Rfl: 0 .  zolpidem (AMBIEN) 5 MG tablet, Take 1 tablet (5 mg total) by mouth every evening., Disp: 90 tablet, Rfl: 0  Allergies  Allergen Reactions  . Codeine   . Penicillins      ROS  Constitutional: Negative for fever or weight change.  Respiratory: Negative for cough and shortness of breath.   Cardiovascular: Negative for chest pain or palpitations.  Gastrointestinal: Negative for abdominal pain, no bowel changes.  Musculoskeletal: Positive  for intermittent gait problem and  joint swelling.  Skin: Negative for rash.  Neurological: Negative for dizziness or headache.  No  other specific complaints in a complete review of systems (except as listed in HPI above).  Objective  Vitals:   10/29/16 0946  BP: 124/74  Pulse: 64  Resp: 16  Temp: 98.6 F (37 C)  TempSrc: Oral  SpO2: 95%  Weight: 220 lb 1.6 oz (99.8 kg)    Body mass index is 31.58 kg/m.  Physical Exam  Constitutional: Patient appears well-developed and well-nourished. Obese  No distress.  HEENT: head atraumatic, normocephalic, pupils equal and reactive to light,  neck supple, throat within normal limits Cardiovascular: Normal rate, regular rhythm and normal heart sounds.  No murmur heard. No BLE edema. Pulmonary/Chest: Effort normal and breath sounds  normal. No respiratory distress. Abdominal: Soft.  There is no tenderness. Psychiatric: Patient has a normal mood and affect. behavior is normal. Judgment and thought content normal.   PHQ2/9: Depression screen [facility] 2/9 10/29/2016 02/12/2016 08/24/2015 04/10/2015 01/16/2015  Decreased Interest 0 0 0 0 0  Down, Depressed, Hopeless 0 0 0 0 0  PHQ - 2 Score 0 0 0 0 0     Fall Risk: Fall Risk  10/29/2016 02/12/2016 08/24/2015 04/10/2015 01/16/2015  Falls in the past year? No No No No No    Functional Status Survey: Is the patient deaf or have difficulty hearing?: No Does the patient have difficulty seeing, even when wearing glasses/contacts?: Yes Does the patient have difficulty concentrating, remembering, or making decisions?: No Does the patient have difficulty walking or climbing stairs?: No Does the patient have difficulty dressing or bathing?: No Does the patient have difficulty doing errands alone such as visiting a doctor's office or shopping?: No    Assessment & Plan  1. Type 2 diabetes with nephropathy (HCC)  - POCT HgB A1C - POCT HgB A1C - metFORMIN (GLUCOPHAGE) 1000 MG tablet; Take 1 tablet (1,000 mg total) by mouth 2 (two) times daily with a meal.  Dispense: 180 tablet; Refill: 1 - pioglitazone (ACTOS) 15 MG tablet; Take 1 tablet  (15 mg total) by mouth daily.  Dispense: 90 tablet; Refill: 1 - glipiZIDE (GLUCOTROL XL) 2.5 MG 24 hr tablet; Take 1 tablet (2.5 mg total) by mouth daily with breakfast.  Dispense: 90 tablet; Refill: 0  2. Needs flu shot  - Flu vaccine HIGH DOSE PF (Fluzone High dose)  3. Insomnia, persistent  Continue medication - zolpidem (AMBIEN) 5 MG tablet; Take 1 tablet (5 mg total) by mouth every evening.  Dispense: 90 tablet; Refill: 0  4. Essential (primary) hypertension  Doing well at this time - amLODipine (NORVASC) 10 MG tablet; Take 1 tablet (10 mg total) by mouth daily.  Dispense: 90 tablet; Refill: 1 - atenolol-chlorthalidone (TENORETIC) 50-25 MG tablet; Take 1 tablet by mouth daily.  Dispense: 90 tablet; Refill: 1 - benazepril (LOTENSIN) 40 MG tablet; Take 1 tablet (40 mg total) by mouth daily.  Dispense: 90 tablet; Refill: 1 - COMPLETE METABOLIC PANEL WITH GFR  5. Dyslipidemia  Continue statin therapy   6. Obesity, morbid ([place])  Discussed with the patient the risk posed by an increased BMI. Discussed importance of portion control, calorie counting and at least 150 minutes of physical activity weekly. Avoid sweet beverages and drink more water. Eat at least 6 servings of fruit and vegetables daily   7. Microalbuminuria  Last urine micro 02/2016 and normal , on [facility]B - benazepril (LOTENSIN) 40 MG tablet; Take 1 tablet (40 mg total) by mouth daily.  Dispense: 90 tablet; Refill: 1  8. Tick bite of back, initial encounter  It happened May 2018, she was sick for 4 days, removed tick and has been doing well since

## 2017-01-26 ENCOUNTER — Other Ambulatory Visit: Payer: Self-pay | Admitting: Family Medicine

## 2017-01-26 DIAGNOSIS — E1121 Type 2 diabetes mellitus with diabetic nephropathy: Secondary | ICD-10-CM

## 2017-01-26 NOTE — Telephone Encounter (Signed)
Refill request for diabetic medication:   Glipizide 2.5 mg  Last office visit pertaining to diabetes: 10/29/2017  Follow up visit: 01/29/2017  Lab Results  Component Value Date   HGBA1C 8.0 10/29/2016

## 2017-01-29 ENCOUNTER — Ambulatory Visit: Payer: Medicare PPO | Admitting: Family Medicine

## 2017-03-18 ENCOUNTER — Ambulatory Visit: Payer: Medicare PPO | Admitting: Family Medicine

## 2017-03-18 ENCOUNTER — Encounter: Payer: Self-pay | Admitting: Family Medicine

## 2017-03-18 VITALS — BP 114/68 | HR 70 | Temp 97.8°F | Resp 16 | Ht 70.0 in | Wt 226.0 lb

## 2017-03-18 DIAGNOSIS — M159 Polyosteoarthritis, unspecified: Secondary | ICD-10-CM

## 2017-03-18 DIAGNOSIS — I10 Essential (primary) hypertension: Secondary | ICD-10-CM

## 2017-03-18 DIAGNOSIS — E1121 Type 2 diabetes mellitus with diabetic nephropathy: Secondary | ICD-10-CM

## 2017-03-18 DIAGNOSIS — R809 Proteinuria, unspecified: Secondary | ICD-10-CM | POA: Diagnosis not present

## 2017-03-18 DIAGNOSIS — E1165 Type 2 diabetes mellitus with hyperglycemia: Secondary | ICD-10-CM | POA: Diagnosis not present

## 2017-03-18 DIAGNOSIS — J069 Acute upper respiratory infection, unspecified: Secondary | ICD-10-CM | POA: Diagnosis not present

## 2017-03-18 DIAGNOSIS — G4700 Insomnia, unspecified: Secondary | ICD-10-CM

## 2017-03-18 DIAGNOSIS — E785 Hyperlipidemia, unspecified: Secondary | ICD-10-CM

## 2017-03-18 DIAGNOSIS — IMO0002 Reserved for concepts with insufficient information to code with codable children: Secondary | ICD-10-CM

## 2017-03-18 DIAGNOSIS — M150 Primary generalized (osteo)arthritis: Secondary | ICD-10-CM | POA: Diagnosis not present

## 2017-03-18 DIAGNOSIS — K219 Gastro-esophageal reflux disease without esophagitis: Secondary | ICD-10-CM

## 2017-03-18 LAB — POCT GLYCOSYLATED HEMOGLOBIN (HGB A1C): Hemoglobin A1C: 8.2

## 2017-03-18 MED ORDER — OMEPRAZOLE 20 MG PO CPDR: 20.0000 mg | DELAYED_RELEASE_CAPSULE | Freq: Every day | ORAL | 4 refills | Status: DC

## 2017-03-18 MED ORDER — ATORVASTATIN CALCIUM 40 MG PO TABS: 40.0000 mg | ORAL_TABLET | Freq: Every day | ORAL | 1 refills | Status: DC

## 2017-03-18 MED ORDER — SEMAGLUTIDE(0.25 OR 0.5MG/DOS) 2 MG/1.5ML ~~LOC~~ SOPN: 0.5000 mg | PEN_INJECTOR | SUBCUTANEOUS | 1 refills | Status: DC

## 2017-03-18 MED ORDER — ZOLPIDEM TARTRATE 5 MG PO TABS: 5.0000 mg | ORAL_TABLET | Freq: Every evening | ORAL | 0 refills | Status: DC

## 2017-03-18 MED ORDER — AMLODIPINE BESYLATE 10 MG PO TABS: 10.0000 mg | ORAL_TABLET | Freq: Every day | ORAL | 1 refills | Status: DC

## 2017-03-18 MED ORDER — METFORMIN HCL 1000 MG PO TABS: 1000.0000 mg | ORAL_TABLET | Freq: Two times a day (BID) | ORAL | 1 refills | Status: DC

## 2017-03-18 MED ORDER — PIOGLITAZONE HCL 15 MG PO TABS: 15.0000 mg | ORAL_TABLET | Freq: Every day | ORAL | 1 refills | Status: DC

## 2017-03-18 MED ORDER — ATENOLOL-CHLORTHALIDONE 50-25 MG PO TABS: 1.0000 | ORAL_TABLET | Freq: Every day | ORAL | 1 refills | Status: DC

## 2017-03-18 MED ORDER — BENAZEPRIL HCL 40 MG PO TABS: 40.0000 mg | ORAL_TABLET | Freq: Every day | ORAL | 1 refills | Status: DC

## 2017-03-18 NOTE — Progress Notes (Signed)
Name: Michelle Montgomery   MRN: [id number]    DOB: May 31, 1950   Date:03/18/2017       Progress Note  Subjective  Chief Complaint  Chief Complaint  Patient presents with  . Medication Refill    4 month F/U  . Diabetes    Check once daily in the morning, Lowest-120 Average-140 Highest-150  . Hypertension    Denies any symptoms  . Hyperlipidemia  . Gastroesophageal Reflux    Well Controlled  . Osteoporosis  . URI    Onset-Monday, had same symptoms for 2 weeks when away and started back. Dry Cough, congestion, sneezing, yellow sinus mucus.   . Insomnia    HPI  DMII with nephropathy: hgbA1C has been the same for over one year, above 8%, , she is on  Glipizide, Metformin and Actos and hgbA1C still above 8 today at 8.2%. Discussed long term risk of uncontrolled DM, she has new secondary insurance plan and is willing to try adding another medication and see if no so expensive.   HTN: taking bp medication as prescribed, denies chest pain, palpitation, edema, dizziness or SOB . BP is at goal, denies side effects.   Hyperlipidemia: taking Atorvastatin, she denies myalgia.   Insomnia: taking Ambien prn, states it helps her fall asleep and stay asleep. Denies side effects, discussed risk and she states she does not want to stop medication  Lack of sense of smell and taste: symptoms started about about one year ago. She still can't smell dirty diapers, skunk smell or her dogs bad breath. She saw ENT and is on a different nasal steroid and states she is smelling a little better now. He suggested MRI brain but she decided to hold off She states she has noticed mild improvement lately, but she still does not want to have MRI, she is considering it now.   GERD: taking Omeprazole, and no heartburn or regurgitation noticed. Discussed long term use of PPI, dementia, cardiovascular disease, dementia and osteoporosis. She tried weaning self off and switching to Zantac but symptoms returned, so  she is back on PPI. Cannot even drink water without PPI   Obesity: her weight is stable, normal appetite, she is active, watching grand- children, she also volunteers.   OA: she is doing very well on Meloxicam prn, takes for about 2 weeks and stops after that No side effects.  URI: she takes care of both her grandchildren 36-4 yo and keeps getting sick. States no fever, facial pressure, or post-nasal drainage, she is using netti pot, no cough or SOB.    Patient Active Problem List   Diagnosis Date Noted  . Varicose veins of both lower extremities 06/12/2016  . BPV (benign positional vertigo), right 08/24/2015  . Smell disturbance 01/16/2015  . Osteoarthritis 11/03/2014  . Cramps of lower extremity 11/03/2014  . Neuralgia neuritis, sciatic nerve 11/03/2014  . Insomnia, persistent 11/03/2014  . Dyslipidemia 11/03/2014  . Type 2 diabetes with nephropathy ([place]) 11/03/2014  . Essential (primary) hypertension 11/03/2014  . History of colon polyps 11/03/2014  . H/O malignant neoplasm of skin 11/03/2014  . Microalbuminuria 11/03/2014  . Obesity, morbid ([place]) 11/03/2014  . Osteopenia 11/03/2014  . Plantar fasciitis 11/03/2014  . Gastro-esophageal reflux disease without esophagitis 11/03/2014  . Bundle branch block, right 11/03/2014    Past Surgical History:  Procedure Laterality Date  . BREAST BIOPSY Left 2015   benign  . FOOT SURGERY Left 08/29/2011   Spur Removal , and Achilles Tendon Tendolysis  .  VARICOSE VEIN SURGERY Bilateral     Family History  Problem Relation Age of Onset  . Cancer Mother        Colon  . Anemia Mother   . Cancer Father        Esophageal  . Diabetes Brother   . Cancer Maternal Uncle        Colon  . Cancer Maternal Grandmother        Colon    Social History   Socioeconomic History  . Marital status: Married    Spouse name: Not on file  . Number of children: Not on file  . Years of education: Not on file  . Highest education level: Not on  file  Social Needs  . Financial resource strain: Not on file  . Food insecurity - worry: Not on file  . Food insecurity - inability: Not on file  . Transportation needs - medical: Not on file  . Transportation needs - non-medical: Not on file  Occupational History  . Not on file  Tobacco Use  . Smoking status: Former Smoker    Packs/day: 12.00    Types: Cigarettes    Last attempt to quit: 02/10/1970    Years since quitting: 47.1  . Smokeless tobacco: Never Used  Substance and Sexual Activity  . Alcohol use: No    Alcohol/week: 0.0 oz  . Drug use: No  . Sexual activity: Yes  Other Topics Concern  . Not on file  Social History Narrative  . Not on file     Current Outpatient Medications:  .  amLODipine (NORVASC) 10 MG tablet, Take 1 tablet (10 mg total) by mouth daily., Disp: 90 tablet, Rfl: 1 .  aspirin 81 MG tablet, Take 1 tablet by mouth daily., Disp: , Rfl:  .  atenolol-chlorthalidone (TENORETIC) 50-25 MG tablet, Take 1 tablet by mouth daily., Disp: 90 tablet, Rfl: 1 .  atorvastatin (LIPITOR) 40 MG tablet, Take 1 tablet (40 mg total) by mouth daily., Disp: 90 tablet, Rfl: 1 .  benazepril (LOTENSIN) 40 MG tablet, Take 1 tablet (40 mg total) by mouth daily., Disp: 90 tablet, Rfl: 1 .  CALCIUM CARBONATE-VIT D-MIN PO, Take 2 tablets by mouth daily. 600 mg bid, Disp: , Rfl:  .  Cholecalciferol (VITAMIN D) 2000 UNITS CAPS, Take 1 tablet by mouth daily., Disp: , Rfl:  .  Coenzyme Q10 (COQ-10 PO), Take 300 tablets by mouth daily., Disp: , Rfl:  .  glipiZIDE (GLUCOTROL XL) 2.5 MG 24 hr tablet, TAKE 1 TABLET (2.5 MG TOTAL) BY MOUTH DAILY WITH BREAKFAST., Disp: 90 tablet, Rfl: 1 .  meloxicam (MOBIC) 15 MG tablet, Take 1 tablet (15 mg total) by mouth daily. (Patient taking differently: Take 15 mg by mouth as needed. ), Disp: 90 tablet, Rfl: 0 .  metFORMIN (GLUCOPHAGE) 1000 MG tablet, Take 1 tablet (1,000 mg total) by mouth 2 (two) times daily with a meal., Disp: 180 tablet, Rfl: 1 .   Omega-3 Fatty Acids (FISH OIL CONCENTRATE) 435 MG CAPS, Take 1 tablet by mouth daily., Disp: , Rfl:  .  omeprazole (PRILOSEC) 20 MG capsule, Take 1 capsule (20 mg total) by mouth daily., Disp: 90 capsule, Rfl: 4 .  pioglitazone (ACTOS) 15 MG tablet, Take 1 tablet (15 mg total) by mouth daily., Disp: 90 tablet, Rfl: 1 .  triamcinolone (NASACORT AQ) 55 MCG/ACT AERO nasal inhaler, Place 2 sprays into the nose daily., Disp: 1 Inhaler, Rfl: 0 .  zolpidem (AMBIEN) 5 MG tablet, Take 1  tablet (5 mg total) by mouth every evening., Disp: 90 tablet, Rfl: 0 .  Semaglutide (OZEMPIC) 0.25 or 0.5 MG/DOSE SOPN, Inject 0.5 mg into the skin once a week., Disp: 6 pen, Rfl: 1  Allergies  Allergen Reactions  . Codeine   . Penicillins      ROS  Constitutional: Negative for fever or weight change.  Respiratory: Negative for cough and shortness of breath.   Cardiovascular: Negative for chest pain or palpitations.  Gastrointestinal: Negative for abdominal pain, no bowel changes.  Musculoskeletal: Negative for gait problem or joint swelling.  Skin: Negative for rash.  Neurological: Negative for dizziness or headache.  No other specific complaints in a complete review of systems (except as listed in HPI above).   Objective  Vitals:   03/18/17 0904  BP: 114/68  Pulse: 70  Resp: 16  Temp: 97.8 F (36.6 C)  TempSrc: Oral  SpO2: 96%  Weight: 226 lb (102.5 kg)  Height: 5\' 10"  (1.778 m)    Body mass index is 32.43 kg/m.  Physical Exam  Constitutional: Patient appears well-developed and well-nourished. Obese  No distress.  HEENT: head atraumatic, normocephalic, pupils equal and reactive to light, ears normal TM bilaterally, neck supple, throat within normal limits Cardiovascular: Normal rate, regular rhythm and normal heart sounds.  No murmur heard. No BLE edema. Pulmonary/Chest: Effort normal and breath sounds normal. No respiratory distress. Abdominal: Soft.  There is no tenderness. Psychiatric:  Patient has a normal mood and affect. behavior is normal. Judgment and thought content normal.  Recent Results (from the past 2160 hour(s))  POCT HgB A1C     Status: Abnormal   Collection Time: 03/18/17  9:08 AM  Result Value Ref Range   Hemoglobin A1C 8.2     Diabetic Foot Exam: Diabetic Foot Exam - Simple   Simple Foot Form Diabetic Foot exam was performed with the following findings:  Yes 03/18/2017  9:25 AM  Visual Inspection No deformities, no ulcerations, no other skin breakdown bilaterally:  Yes Sensation Testing Intact to touch and monofilament testing bilaterally:  Yes Pulse Check Posterior Tibialis and Dorsalis pulse intact bilaterally:  Yes Comments      PHQ2/9: Depression screen [facility] 2/9 03/18/2017 10/29/2016 02/12/2016 08/24/2015 04/10/2015  Decreased Interest 0 0 0 0 0  Down, Depressed, Hopeless 0 0 0 0 0  PHQ - 2 Score 0 0 0 0 0    Fall Risk: Fall Risk  03/18/2017 10/29/2016 02/12/2016 08/24/2015 04/10/2015  Falls in the past year? No No No No No     Functional Status Survey: Is the patient deaf or have difficulty hearing?: No Does the patient have difficulty seeing, even when wearing glasses/contacts?: No Does the patient have difficulty concentrating, remembering, or making decisions?: No Does the patient have difficulty walking or climbing stairs?: No Does the patient have difficulty dressing or bathing?: No Does the patient have difficulty doing errands alone such as visiting a doctor's office or shopping?: No    Assessment & Plan  1. Uncontrolled type 2 diabetes mellitus with nephropathy (HCC)  - POCT HgB A1C - Semaglutide (OZEMPIC) 0.25 or 0.5 MG/DOSE SOPN; Inject 0.5 mg into the skin once a week.  Dispense: 6 pen; Refill: 1 - pioglitazone (ACTOS) 15 MG tablet; Take 1 tablet (15 mg total) by mouth daily.  Dispense: 90 tablet; Refill: 1  2. Essential (primary) hypertension  - amLODipine (NORVASC) 10 MG tablet; Take 1 tablet (10 mg total) by mouth daily.   Dispense: 90 tablet; Refill:  1 - atenolol-chlorthalidone (TENORETIC) 50-25 MG tablet; Take 1 tablet by mouth daily.  Dispense: 90 tablet; Refill: 1 - benazepril (LOTENSIN) 40 MG tablet; Take 1 tablet (40 mg total) by mouth daily.  Dispense: 90 tablet; Refill: 1  3. Dyslipidemia  - atorvastatin (LIPITOR) 40 MG tablet; Take 1 tablet (40 mg total) by mouth daily.  Dispense: 90 tablet; Refill: 1  4. Primary osteoarthritis involving multiple joints  Stable, continue Tylenol   5. Morbid obesity, unspecified obesity type [facility])  Discussed with the patient the risk posed by an increased BMI. Discussed importance of portion control, calorie counting and at least 150 minutes of physical activity weekly. Avoid sweet beverages and drink more water. Eat at least 6 servings of fruit and vegetables daily   6. URI, acute  Continue otc medication. Coricidin HBP, saline spray   7. Microalbuminuria  - benazepril (LOTENSIN) 40 MG tablet; Take 1 tablet (40 mg total) by mouth daily.  Dispense: 90 tablet; Refill: 1  8. Gastro-esophageal reflux disease without esophagitis  - omeprazole (PRILOSEC) 20 MG capsule; Take 1 capsule (20 mg total) by mouth daily.  Dispense: 90 capsule; Refill: 4  9. Insomnia, persistent  - zolpidem (AMBIEN) 5 MG tablet; Take 1 tablet (5 mg total) by mouth every evening.  Dispense: 90 tablet; Refill: 0

## 2017-03-23 ENCOUNTER — Telehealth: Payer: Self-pay | Admitting: Family Medicine

## 2017-03-23 DIAGNOSIS — E1121 Type 2 diabetes mellitus with diabetic nephropathy: Secondary | ICD-10-CM

## 2017-03-23 DIAGNOSIS — E1165 Type 2 diabetes mellitus with hyperglycemia: Principal | ICD-10-CM

## 2017-03-23 DIAGNOSIS — IMO0002 Reserved for concepts with insufficient information to code with codable children: Secondary | ICD-10-CM

## 2017-03-23 MED ORDER — SEMAGLUTIDE(0.25 OR 0.5MG/DOS) 2 MG/1.5ML ~~LOC~~ SOPN: 0.5000 mg | PEN_INJECTOR | SUBCUTANEOUS | 1 refills | Status: DC

## 2017-03-23 NOTE — Telephone Encounter (Signed)
Copied from [place]. Topic: Quick Communication - See Telephone Encounter >> Mar 23, 2017 10:21 AM Bennye Alm wrote: CRM for notification. See Telephone encounter for:  Patient called stating that she was prescribed the Semaglutide (OZEMPIC) 0.25 or 0.5 MG/DOSE SOPN, and the rx was sent to the mail order pharmacy, but they don't accept discount cards, so she needs to have a physical copy of the prescription to take to the local pharmacy along with the savings card she was given. Patient requested to speak with Dr. Ancil Boozer' assistant. Please advise. 03/23/17

## 2017-03-23 NOTE — Telephone Encounter (Signed)
Patient states it was $390 at her Mail Order and would like a printed out prescription to take it to local prescriptions with her coupon.

## 2017-03-30 ENCOUNTER — Telehealth: Payer: Self-pay

## 2017-03-30 NOTE — Telephone Encounter (Signed)
Copied from [place] [id number]. Topic: General - Other >> Mar 30, 2017 11:06 AM Carolyn Stare wrote:   Pt did not pick up RX from any local pharmacies and now is asking the RX to be resent to [facility] for 90 day supply which would be for 84 days.   Now the med is cheaper at [facility]) 0.25 or 0.5 MG/DOSE SOPN  Last office visit: 03/18/2017  Last physical exam: None indicated  Follow-up on file. 06/16/2017

## 2017-05-18 LAB — HM DIABETES EYE EXAM

## 2017-05-20 ENCOUNTER — Encounter: Payer: Self-pay | Admitting: Family Medicine

## 2017-06-16 ENCOUNTER — Ambulatory Visit: Payer: Medicare PPO | Admitting: Family Medicine

## 2017-06-16 ENCOUNTER — Ambulatory Visit: Payer: Medicare PPO

## 2017-07-28 ENCOUNTER — Encounter: Payer: Medicare PPO | Admitting: Obstetrics and Gynecology

## 2017-08-11 ENCOUNTER — Encounter: Payer: Self-pay | Admitting: Obstetrics and Gynecology

## 2017-08-11 ENCOUNTER — Ambulatory Visit: Payer: Medicare PPO | Admitting: Obstetrics and Gynecology

## 2017-08-11 ENCOUNTER — Other Ambulatory Visit (HOSPITAL_COMMUNITY)
Admission: RE | Admit: 2017-08-11 | Discharge: 2017-08-11 | Disposition: A | Discharge status: Home / Self Care | Payer: Medicare PPO | Source: Ambulatory Visit | Attending: Obstetrics and Gynecology | Admitting: Obstetrics and Gynecology

## 2017-08-11 VITALS — BP 112/74 | HR 89 | Ht 70.0 in | Wt 224.8 lb

## 2017-08-11 DIAGNOSIS — Z7689 Persons encountering health services in other specified circumstances: Secondary | ICD-10-CM | POA: Diagnosis present

## 2017-08-11 DIAGNOSIS — Z01419 Encounter for gynecological examination (general) (routine) without abnormal findings: Secondary | ICD-10-CM | POA: Insufficient documentation

## 2017-08-11 DIAGNOSIS — I10 Essential (primary) hypertension: Secondary | ICD-10-CM

## 2017-08-11 DIAGNOSIS — E669 Obesity, unspecified: Secondary | ICD-10-CM

## 2017-08-11 DIAGNOSIS — N952 Postmenopausal atrophic vaginitis: Secondary | ICD-10-CM | POA: Diagnosis not present

## 2017-08-11 DIAGNOSIS — M8580 Other specified disorders of bone density and structure, unspecified site: Secondary | ICD-10-CM | POA: Diagnosis not present

## 2017-08-11 DIAGNOSIS — E1121 Type 2 diabetes mellitus with diabetic nephropathy: Secondary | ICD-10-CM

## 2017-08-11 DIAGNOSIS — N941 Unspecified dyspareunia: Secondary | ICD-10-CM

## 2017-08-11 NOTE — Progress Notes (Signed)
Pt stated that she is doing well.

## 2017-08-11 NOTE — Patient Instructions (Signed)
Health Maintenance for Postmenopausal Women Menopause is a normal process in which your reproductive ability comes to an end. This process happens gradually over a span of months to years, usually between the ages of 22 and 9. Menopause is complete when you have missed 12 consecutive menstrual periods. It is important to talk with your health care provider about some of the most common conditions that affect postmenopausal women, such as heart disease, cancer, and bone loss (osteoporosis). Adopting a healthy lifestyle and getting preventive care can help to promote your health and wellness. Those actions can also lower your chances of developing some of these common conditions. What should I know about menopause? During menopause, you may experience a number of symptoms, such as:  Moderate-to-severe hot flashes.  Night sweats.  Decrease in sex drive.  Mood swings.  Headaches.  Tiredness.  Irritability.  Memory problems.  Insomnia.  Choosing to treat or not to treat menopausal changes is an individual decision that you make with your health care provider. What should I know about hormone replacement therapy and supplements? Hormone therapy products are effective for treating symptoms that are associated with menopause, such as hot flashes and night sweats. Hormone replacement carries certain risks, especially as you become older. If you are thinking about using estrogen or estrogen with progestin treatments, discuss the benefits and risks with your health care provider. What should I know about heart disease and stroke? Heart disease, heart attack, and stroke become more likely as you age. This may be due, in part, to the hormonal changes that your body experiences during menopause. These can affect how your body processes dietary fats, triglycerides, and cholesterol. Heart attack and stroke are both medical emergencies. There are many things that you can do to help prevent heart disease  and stroke:  Have your blood pressure checked at least every 1-2 years. High blood pressure causes heart disease and increases the risk of stroke.  If you are 53-22 years old, ask your health care provider if you should take aspirin to prevent a heart attack or a stroke.  Do not use any tobacco products, including cigarettes, chewing tobacco, or electronic cigarettes. If you need help quitting, ask your health care provider.  It is important to eat a healthy diet and maintain a healthy weight. ? Be sure to include plenty of vegetables, fruits, low-fat dairy products, and lean protein. ? Avoid eating foods that are high in solid fats, added sugars, or salt (sodium).  Get regular exercise. This is one of the most important things that you can do for your health. ? Try to exercise for at least 150 minutes each week. The type of exercise that you do should increase your heart rate and make you sweat. This is known as moderate-intensity exercise. ? Try to do strengthening exercises at least twice each week. Do these in addition to the moderate-intensity exercise.  Know your numbers.Ask your health care provider to check your cholesterol and your blood glucose. Continue to have your blood tested as directed by your health care provider.  What should I know about cancer screening? There are several types of cancer. Take the following steps to reduce your risk and to catch any cancer development as early as possible. Breast Cancer  Practice breast self-awareness. ? This means understanding how your breasts normally appear and feel. ? It also means doing regular breast self-exams. Let your health care provider know about any changes, no matter how small.  If you are 40  or older, have a clinician do a breast exam (clinical breast exam or CBE) every year. Depending on your age, family history, and medical history, it may be recommended that you also have a yearly breast X-ray (mammogram).  If you  have a family history of breast cancer, talk with your health care provider about genetic screening.  If you are at high risk for breast cancer, talk with your health care provider about having an MRI and a mammogram every year.  Breast cancer (BRCA) gene test is recommended for women who have family members with BRCA-related cancers. Results of the assessment will determine the need for genetic counseling and BRCA1 and for BRCA2 testing. BRCA-related cancers include these types: ? Breast. This occurs in males or females. ? Ovarian. ? Tubal. This may also be called fallopian tube cancer. ? Cancer of the abdominal or pelvic lining (peritoneal cancer). ? Prostate. ? Pancreatic.  Cervical, Uterine, and Ovarian Cancer Your health care provider may recommend that you be screened regularly for cancer of the pelvic organs. These include your ovaries, uterus, and vagina. This screening involves a pelvic exam, which includes checking for microscopic changes to the surface of your cervix (Pap test).  For women ages 21-65, health care providers may recommend a pelvic exam and a Pap test every three years. For women ages 79-65, they may recommend the Pap test and pelvic exam, combined with testing for human papilloma virus (HPV), every five years. Some types of HPV increase your risk of cervical cancer. Testing for HPV may also be done on women of any age who have unclear Pap test results.  Other health care providers may not recommend any screening for nonpregnant women who are considered low risk for pelvic cancer and have no symptoms. Ask your health care provider if a screening pelvic exam is right for you.  If you have had past treatment for cervical cancer or a condition that could lead to cancer, you need Pap tests and screening for cancer for at least 20 years after your treatment. If Pap tests have been discontinued for you, your risk factors (such as having a new sexual partner) need to be  reassessed to determine if you should start having screenings again. Some women have medical problems that increase the chance of getting cervical cancer. In these cases, your health care provider may recommend that you have screening and Pap tests more often.  If you have a family history of uterine cancer or ovarian cancer, talk with your health care provider about genetic screening.  If you have vaginal bleeding after reaching menopause, tell your health care provider.  There are currently no reliable tests available to screen for ovarian cancer.  Lung Cancer Lung cancer screening is recommended for adults 69-62 years old who are at high risk for lung cancer because of a history of smoking. A yearly low-dose CT scan of the lungs is recommended if you:  Currently smoke.  Have a history of at least 30 pack-years of smoking and you currently smoke or have quit within the past 15 years. A pack-year is smoking an average of one pack of cigarettes per day for one year.  Yearly screening should:  Continue until it has been 15 years since you quit.  Stop if you develop a health problem that would prevent you from having lung cancer treatment.  Colorectal Cancer  This type of cancer can be detected and can often be prevented.  Routine colorectal cancer screening usually begins at  age 42 and continues through age 45.  If you have risk factors for colon cancer, your health care provider may recommend that you be screened at an earlier age.  If you have a family history of colorectal cancer, talk with your health care provider about genetic screening.  Your health care provider may also recommend using home test kits to check for hidden blood in your stool.  A small camera at the end of a tube can be used to examine your colon directly (sigmoidoscopy or colonoscopy). This is done to check for the earliest forms of colorectal cancer.  Direct examination of the colon should be repeated every  5-10 years until age 71. However, if early forms of precancerous polyps or small growths are found or if you have a family history or genetic risk for colorectal cancer, you may need to be screened more often.  Skin Cancer  Check your skin from head to toe regularly.  Monitor any moles. Be sure to tell your health care provider: ? About any new moles or changes in moles, especially if there is a change in a mole's shape or color. ? If you have a mole that is larger than the size of a pencil eraser.  If any of your family members has a history of skin cancer, especially at a young age, talk with your health care provider about genetic screening.  Always use sunscreen. Apply sunscreen liberally and repeatedly throughout the day.  Whenever you are outside, protect yourself by wearing long sleeves, pants, a wide-brimmed hat, and sunglasses.  What should I know about osteoporosis? Osteoporosis is a condition in which bone destruction happens more quickly than new bone creation. After menopause, you may be at an increased risk for osteoporosis. To help prevent osteoporosis or the bone fractures that can happen because of osteoporosis, the following is recommended:  If you are 46-71 years old, get at least 1,000 mg of calcium and at least 600 mg of vitamin D per day.  If you are older than age 55 but younger than age 65, get at least 1,200 mg of calcium and at least 600 mg of vitamin D per day.  If you are older than age 54, get at least 1,200 mg of calcium and at least 800 mg of vitamin D per day.  Smoking and excessive alcohol intake increase the risk of osteoporosis. Eat foods that are rich in calcium and vitamin D, and do weight-bearing exercises several times each week as directed by your health care provider. What should I know about how menopause affects my mental health? Depression may occur at any age, but it is more common as you become older. Common symptoms of depression  include:  Low or sad mood.  Changes in sleep patterns.  Changes in appetite or eating patterns.  Feeling an overall lack of motivation or enjoyment of activities that you previously enjoyed.  Frequent crying spells.  Talk with your health care provider if you think that you are experiencing depression. What should I know about immunizations? It is important that you get and maintain your immunizations. These include:  Tetanus, diphtheria, and pertussis (Tdap) booster vaccine.  Influenza every year before the flu season begins.  Pneumonia vaccine.  Shingles vaccine.  Your health care provider may also recommend other immunizations. This information is not intended to replace advice given to you by your health care provider. Make sure you discuss any questions you have with your health care provider. Document Released: 03/21/2005  Document Revised: 08/17/2015 Document Reviewed: 10/31/2014 Elsevier Interactive Patient Education  2018 [place].      Atrophic Vaginitis Atrophic vaginitis is when the tissues that line the vagina become dry and thin. This is caused by a drop in estrogen. Estrogen helps:  To keep the vagina moist.  To make a clear fluid that helps: ? To lubricate the vagina for sex. ? To protect the vagina from infection.  If the lining of the vagina is dry and thin, it may:  Make sex painful. It may also cause bleeding.  Cause a feeling of: ? Burning. ? Irritation. ? Itchiness.  Make an exam of your vagina painful. It may also cause bleeding.  Make you lose interest in sex.  Cause a burning feeling when you pee.  Make your vaginal fluid (discharge) brown or yellow.  For some women, there are no symptoms. This condition is most common in women who do not get their regular menstrual periods anymore (menopause). This often starts when a woman is 51-42 years old. Follow these instructions at home:  Take medicines only as told by your doctor. Do  not use any herbal or alternative medicines unless your doctor says it is okay.  Use over-the-counter products for dryness only as told by your doctor. These include: ? Creams. ? Lubricants. ? Moisturizers.  Do not douche.  Do not use products that can make your vagina dry. These include: ? Scented feminine sprays. ? Scented tampons. ? Scented soaps.  If it hurts to have sex, tell your sexual partner. Contact a doctor if:  Your discharge looks different than normal.  Your vagina has an unusual smell.  You have new symptoms.  Your symptoms do not get better with treatment.  Your symptoms get worse. This information is not intended to replace advice given to you by your health care provider. Make sure you discuss any questions you have with your health care provider. Document Released: 07/16/2007 Document Revised: 07/05/2015 Document Reviewed: 01/18/2014 Elsevier Interactive Patient Education  2018 [place] oral tablets What is this medicine? OSPEMIFENE (os PEM i feen) is used to treat painful sexual intercourse in females after menopause, a symptom of menopause that occurs due to changes in and around the vagina. This medicine may be used for other purposes; ask your health care provider or pharmacist if you have questions. COMMON BRAND NAME(S): Osphena What should I tell my health care provider before I take this medicine? They need to know if you have any of these conditions: -cancer, such as breast, uterine, or other cancer -heart disease -history of blood clots -history of stroke -history of vaginal bleeding -liver disease -premenopausal -smoke tobacco -an unusual or allergic reaction to ospemifene, other medicines, foods, dyes, or preservatives -pregnant or trying to get pregnant -breast-feeding How should I use this medicine? Take this medicine by mouth with a glass of water. Take this medicine with food. Follow the directions on the  prescription label. Do not take your medicine more often than directed. Talk to your pediatrician regarding the use of this medicine in children. Special care may be needed. Overdosage: If you think you have taken too much of this medicine contact a poison control center or emergency room at once. NOTE: This medicine is only for you. Do not share this medicine with others. What if I miss a dose? If you miss a dose, take it as soon as you can. If it is almost time for your next  dose, take only that dose. Do not take double or extra doses. What may interact with this medicine? -doxycycline -estrogens -fluconazole -furosemide -glyburide -ketoconazole -phenytoin -rifampin -warfarin This list may not describe all possible interactions. Give your health care provider a list of all the medicines, herbs, non-prescription drugs, or dietary supplements you use. Also tell them if you smoke, drink alcohol, or use illegal drugs. Some items may interact with your medicine. What should I watch for while using this medicine? Visit your health care professional for regular checks on your progress. You will need a regular breast and pelvic exam and Pap smear while on this medicine. You should also discuss the need for regular mammograms with your health care professional, and follow his or her guidelines for these tests. Also, periodically discuss the need to continue taking this medicine. Taking this medicine for long periods of time may increase your risk for serious side effects. This medicine can increase the risk of developing a condition (endometrial hyperplasia) that may lead to cancer of the lining of the uterus. Taking progestins, another hormone drug, with this medicine lowers the risk of developing this condition. Therefore, if your uterus has not been removed (by a hysterectomy), your doctor may prescribe a progestin for you to take together with your estrogen. You should know, however, that taking  estrogens with progestins may have additional health risks. You should discuss the use of estrogens and progestins with your health care professional to determine the benefits and risks for you. This medicine can rarely cause blood clots. You should avoid long periods of bed rest while taking this medicine. If you are going to have surgery, tell your doctor or health care professional that you are taking this medicine. This medicine should be stopped at least 4-6 weeks before surgery. After surgery, it should be restarted only after you are walking again. It should not be restarted while you still need long periods of bed rest. You should not smoke while taking this medicine. Smoking may also increase your risk of blood clots. Smoking can also decrease the effects of this medicine. This medicine does not prevent hot flashes. It may cause hot flashes in some patients. If you have any reason to think you are pregnant; stop taking this medicine at once and contact your doctor or health care professional. What side effects may I notice from receiving this medicine? Side effects that you should report to your doctor or health care professional as soon as possible: -breathing problems -changes in vision -confusion, trouble speaking or understanding -new breast lumps -pain, swelling, warmth in the leg -pelvic pain or pressure -severe headaches -sudden chest pain -sudden numbness or weakness of the face, arm or leg -trouble walking, dizziness, loss of balance or coordination -unusual vaginal bleeding patterns -vaginal discharge that is bloody or brown Side effects that usually do not require medical attention (report to your doctor or health care professional if they continue or are bothersome): -hot flushes or flashes -increased sweating -muscle cramps -vaginal discharge (white or clear) This list may not describe all possible side effects. Call your doctor for medical advice about side effects. You  may report side effects to FDA at 1-800-FDA-1088. Where should I keep my medicine? Keep out of the reach of children. Store at room temperature between 20 and 25 degrees C (68 and 77 degrees F). Protect from light. Keep container tightly closed. Throw away any unused medicine after the expiration date. NOTE: This sheet is a summary. It may not  cover all possible information. If you have questions about this medicine, talk to your doctor, pharmacist, or health care provider.  2018 Elsevier/[employer] Standard (2015-03-01 10:08:00)

## 2017-08-11 NOTE — Progress Notes (Signed)
ANNUAL PREVENTATIVE CARE GYNECOLOGY  ENCOUNTER NOTE  Subjective:       Michelle Montgomery is a 67 y.o. [phone] female here to establish care, and for a routine annual gynecologic exam. Previously seen at [facility] up until 2014, and has not had any gynecologic care since then. The patient is sexually active. The patient is not taking hormone replacement therapy. Patient denies post-menopausal vaginal bleeding. The patient wears seatbelts: yes. The patient participates in regular exercise: no. Has the patient ever been transfused or tattooed?: no. The patient reports that there is not domestic violence in her life.  Current complaints: 1.  Vaginal dryness and dyspareunia.  Has been occurring off and on for several years.  Notes being seen by her Dermatologist several years ago and was given Premarin plus a steroid cream to mix together and use externally, which she notes helped some while using it. Also has used coconut oil which helped some as well. Reports h/o use of internal vaginal estrogen, which caused moderate leg cramping, so discontinued. Denies vasomotor symptoms.    Gynecologic History No LMP recorded. Patient is postmenopausal. Contraception: post menopausal status Last Pap: 08/2012. Results were: normal, Denies h/o abnormal pap smear Last mammogram: 07/2016. Results were: normal Last Colonoscopy: 09/2010.  Results were: normal. Patient does have a h/o colon polyps and family h/o colon cancer.  Last Dexa Scan: 01/2015.  Results were: osteopenia   Obstetric History OB History  Gravida Para Term Preterm AB Living  2 2 2     2   SAB TAB Ectopic Multiple Live Births          2    # Outcome Date GA Lbr Len/2nd Weight Sex Delivery Anes PTL Lv  2 Term 60    M Vag-Spont   LIV  1 Term 1979    M Vag-Spont   LIV    Past Medical History:  Diagnosis Date  . Arthrosis   . Bilateral leg cramps   . Bilateral sciatica   . Chronic insomnia   . Diabetes mellitus without  complication ([place])   . Dyslipidemia   . History of colon polyps   . History of skin cancer    Dr. Ave Filter, history of basal cell and squamous cells carcinoma  . Hypertension   . Microalbuminuria    100-DM  . Obesity   . Plantar fasciitis, left    Dr. Milinda Pointer  . Reflux   . Right bundle branch block     Family History  Problem Relation Age of Onset  . Cancer Mother        Colon  . Anemia Mother   . Cancer Father        Esophageal  . Diabetes Brother   . Cancer Maternal Uncle        Colon  . Cancer Maternal Grandmother        Colon    Past Surgical History:  Procedure Laterality Date  . BREAST BIOPSY Left 2015   benign  . FOOT SURGERY Left 08/29/2011   Spur Removal , and Achilles Tendon Tendolysis  . VARICOSE VEIN SURGERY Bilateral     Social History   Socioeconomic History  . Marital status: Married    Spouse name: Not on file  . Number of children: Not on file  . Years of education: Not on file  . Highest education level: Not on file  Occupational History  . Not on file  Social Needs  . Financial resource strain:  Not on file  . Food insecurity:    Worry: Not on file    Inability: Not on file  . Transportation needs:    Medical: Not on file    Non-medical: Not on file  Tobacco Use  . Smoking status: Former Smoker    Packs/day: 12.00    Types: Cigarettes    Last attempt to quit: 02/10/1970    Years since quitting: 47.5  . Smokeless tobacco: Never Used  Substance and Sexual Activity  . Alcohol use: No    Alcohol/week: 0.0 oz  . Drug use: No  . Sexual activity: Yes  Lifestyle  . Physical activity:    Days per week: Not on file    Minutes per session: Not on file  . Stress: Not on file  Relationships  . Social connections:    Talks on phone: Not on file    Gets together: Not on file    Attends religious service: Not on file    Active member of club or organization: Not on file    Attends meetings of clubs or organizations: Not on file     Relationship status: Not on file  . Intimate partner violence:    Fear of current or ex partner: Not on file    Emotionally abused: Not on file    Physically abused: Not on file    Forced sexual activity: Not on file  Other Topics Concern  . Not on file  Social History Narrative  . Not on file    Current Outpatient Medications on File Prior to Visit  Medication Sig Dispense Refill  . amLODipine (NORVASC) 10 MG tablet Take 1 tablet (10 mg total) by mouth daily. 90 tablet 1  . aspirin 81 MG tablet Take 1 tablet by mouth daily.    Marland Kitchen atenolol-chlorthalidone (TENORETIC) 50-25 MG tablet Take 1 tablet by mouth daily. 90 tablet 1  . atorvastatin (LIPITOR) 40 MG tablet Take 1 tablet (40 mg total) by mouth daily. 90 tablet 1  . benazepril (LOTENSIN) 40 MG tablet Take 1 tablet (40 mg total) by mouth daily. 90 tablet 1  . CALCIUM CARBONATE-VIT D-MIN PO Take 2 tablets by mouth daily. 600 mg bid    . Cholecalciferol (VITAMIN D) 2000 UNITS CAPS Take 1 tablet by mouth daily.    . Coenzyme Q10 (COQ-10 PO) Take 300 tablets by mouth daily.    . meloxicam (MOBIC) 15 MG tablet Take 1 tablet (15 mg total) by mouth daily. (Patient taking differently: Take 15 mg by mouth as needed. ) 90 tablet 0  . metFORMIN (GLUCOPHAGE) 1000 MG tablet Take 1 tablet (1,000 mg total) by mouth 2 (two) times daily with a meal. 180 tablet 1  . Omega-3 Fatty Acids (FISH OIL CONCENTRATE) 435 MG CAPS Take 1 tablet by mouth daily.    Marland Kitchen omeprazole (PRILOSEC) 20 MG capsule Take 1 capsule (20 mg total) by mouth daily. 90 capsule 4  . pioglitazone (ACTOS) 15 MG tablet Take 1 tablet (15 mg total) by mouth daily. 90 tablet 1  . Semaglutide (OZEMPIC) 0.25 or 0.5 MG/DOSE SOPN Inject 0.5 mg into the skin once a week. 6 pen 1  . triamcinolone (NASACORT AQ) 55 MCG/ACT AERO nasal inhaler Place 2 sprays into the nose daily. (Patient taking differently: Place 2 sprays into the nose as needed. ) 1 Inhaler 0  . zolpidem (AMBIEN) 5 MG tablet Take 1  tablet (5 mg total) by mouth every evening. (Patient taking differently: Take 5 mg by  mouth as needed. ) 90 tablet 0   No current facility-administered medications on file prior to visit.     Allergies  Allergen Reactions  . Codeine   . Penicillins      Review of Systems ROS Review of Systems - General ROS: negative for - chills, fatigue, fever, hot flashes, night sweats, weight gain or weight loss Psychological ROS: negative for - anxiety, decreased libido, depression, mood swings, physical abuse or sexual abuse Ophthalmic ROS: negative for - blurry vision, eye pain or loss of vision ENT ROS: negative for - headaches, hearing change, visual changes or vocal changes Allergy and Immunology ROS: negative for - hives, itchy/watery eyes or seasonal allergies Hematological and Lymphatic ROS: negative for - bleeding problems, bruising, swollen lymph nodes or weight loss Endocrine ROS: negative for - galactorrhea, hair pattern changes, hot flashes, malaise/lethargy, mood swings, palpitations, polydipsia/polyuria, skin changes, temperature intolerance or unexpected weight changes Breast ROS: negative for - new or changing breast lumps or nipple discharge Respiratory ROS: negative for - cough or shortness of breath Cardiovascular ROS: negative for - chest pain, irregular heartbeat, palpitations or shortness of breath Gastrointestinal ROS: no abdominal pain, change in bowel habits, or black or bloody stools Genito-Urinary ROS: no dysuria, trouble voiding, or hematuria. Positive for vaginal dryness and dyspareunia.  Musculoskeletal ROS: negative for - joint pain or joint stiffness Neurological ROS: negative for - bowel and bladder control changes Dermatological ROS: negative for rash and skin lesion changes   Objective:   BP 112/74   Pulse 89   Ht 5\' 10"  (1.778 m)   Wt 224 lb 12.8 oz (102 kg)   BMI 32.26 kg/m  CONSTITUTIONAL: Well-developed, well-nourished female in no acute distress. Mild  obesity PSYCHIATRIC: Normal mood and affect. Normal behavior. Normal judgment and thought content. [place]: Alert and oriented to person, place, and time. Normal muscle tone coordination. No cranial nerve deficit noted. HENT:  Normocephalic, atraumatic, External right and left ear normal. Oropharynx is clear and moist EYES: Conjunctivae and EOM are normal. Pupils are equal, round, and reactive to light. No scleral icterus.  NECK: Normal range of motion, supple, no masses.  Normal thyroid.  SKIN: Skin is warm and dry. No rash noted. Not diaphoretic. No erythema. No pallor. CARDIOVASCULAR: Normal heart rate noted, regular rhythm, no murmur. RESPIRATORY: Clear to auscultation bilaterally. Effort and breath sounds normal, no problems with respiration noted. BREASTS: Symmetric in size. No masses, skin changes, nipple drainage, or lymphadenopathy. ABDOMEN: Soft, normal bowel sounds, no distention noted.  No tenderness, rebound or guarding.  BLADDER: Normal PELVIC:  Bladder: no bladder distension noted  Urethra: normal appearing urethra with no masses, tenderness or lesions  Vulva: vulvar hypopigmentation near labia minora and inner third of labia majora with thinning with atrophy  Vagina: moderate to severe atrophy. No discharge or lesions. Cystocele Grade 2 with uterine descent  Cervix: normal appearing cervix without discharge or lesions  Uterus: uterus is normal size, shape, consistency and nontender  Adnexa: normal adnexa in size, nontender and no masses  RV: External Exam NormaI, No Rectal Masses and Normal Sphincter tone  MUSCULOSKELETAL: Normal range of motion. No tenderness.  No cyanosis, clubbing, or edema.  2+ distal pulses. LYMPHATIC: No Axillary, Supraclavicular, or Inguinal Adenopathy.   Labs: Lab Results  Component Value Date   WBC 5.8 11/06/2014   HGB 14.2 11/06/2014   HCT 41.3 11/06/2014   MCV 91 11/06/2014   PLT 225 11/06/2014    Lab Results  Component Value Date  CREATININE 0.70 10/29/2016   BUN 18 10/29/2016   NA 139 10/29/2016   K 4.2 10/29/2016   CL 100 10/29/2016   CO2 29 10/29/2016    Lab Results  Component Value Date   ALT 32 (H) 10/29/2016   AST 25 10/29/2016   ALKPHOS 36 08/24/2015   BILITOT 0.7 10/29/2016    Lab Results  Component Value Date   CHOL 111 10/29/2016   HDL 41 (L) 10/29/2016   LDLCALC 47 10/29/2016   TRIG 148 10/29/2016   CHOLHDL 2.7 10/29/2016    No results found for: TSH  Lab Results  Component Value Date   HGBA1C 8.2 03/18/2017     Assessment:   Encounter to establish care with new doctor  Cervical smear, as part of routine gynecological examination Vaginal atrophy Dysparenia Mild obesity Type 2 diabetes with nephropathy ([place]) Osteopenia, unspecified location Essential (primary) hypertension  Plan:  Pap: Pap Co Test Mammogram: Not Ordered. To be ordered by PCP.  Stool Guaiac Testing:  Not Ordered. PCP to order repeat colonoscopy in light of patient's history of colon polyps and family h/o colon cancer.  Labs: To be performed by PCP. Has appointment next month Routine preventative health maintenance measures emphasized: Exercise/Diet/Weight control, Tobacco Warnings, Alcohol/Substance use risks and Stress Management.  Hypertension and Diabetes managed by PCP.  Osteopenia noted on prior Dexa scan. Encouraged use of Calcium and Vitamin D supplementation.  Diabetes and HTN managed by PCP.  Dyspareunia likely secondary to vaginal atrophy. Has tried internal vaginal estrogen in the past with side effects, but was able to use externally in the past without difficulty.  Discussed management options, including vaginal testosterone [place]), oral SERM therapy with Osphena, OTC remedies (continue coconut oil/mineral oil but use internally as well, or vaginal moisturizers as well). Patient desires to try Osphena.  Given 1 month of samples. Will also treat with external Premarin cream externally for  alleviation of symptoms.  Return to [place] for annual exam, in 1 month for f/u vaginal atrophy and dyspareunia   Rubie Maid, MD  [facility]

## 2017-08-17 LAB — CYTOLOGY - PAP: DIAGNOSIS: NEGATIVE

## 2017-08-20 ENCOUNTER — Ambulatory Visit: Payer: Medicare PPO | Admitting: Family Medicine

## 2017-08-20 ENCOUNTER — Ambulatory Visit (INDEPENDENT_AMBULATORY_CARE_PROVIDER_SITE_OTHER): Payer: Medicare PPO

## 2017-08-20 ENCOUNTER — Encounter: Payer: Self-pay | Admitting: Family Medicine

## 2017-08-20 ENCOUNTER — Other Ambulatory Visit: Payer: Self-pay | Admitting: Family Medicine

## 2017-08-20 VITALS — BP 110/64 | HR 72 | Temp 97.5°F | Resp 12 | Ht 70.0 in | Wt 219.7 lb

## 2017-08-20 DIAGNOSIS — E1121 Type 2 diabetes mellitus with diabetic nephropathy: Secondary | ICD-10-CM

## 2017-08-20 DIAGNOSIS — IMO0002 Reserved for concepts with insufficient information to code with codable children: Secondary | ICD-10-CM

## 2017-08-20 DIAGNOSIS — E2839 Other primary ovarian failure: Secondary | ICD-10-CM | POA: Diagnosis not present

## 2017-08-20 DIAGNOSIS — Z1239 Encounter for other screening for malignant neoplasm of breast: Secondary | ICD-10-CM

## 2017-08-20 DIAGNOSIS — Z780 Asymptomatic menopausal state: Secondary | ICD-10-CM

## 2017-08-20 DIAGNOSIS — M8580 Other specified disorders of bone density and structure, unspecified site: Secondary | ICD-10-CM

## 2017-08-20 DIAGNOSIS — Z0000 Encounter for general adult medical examination without abnormal findings: Secondary | ICD-10-CM | POA: Diagnosis not present

## 2017-08-20 DIAGNOSIS — R809 Proteinuria, unspecified: Secondary | ICD-10-CM

## 2017-08-20 DIAGNOSIS — Z1231 Encounter for screening mammogram for malignant neoplasm of breast: Secondary | ICD-10-CM | POA: Diagnosis not present

## 2017-08-20 DIAGNOSIS — E785 Hyperlipidemia, unspecified: Secondary | ICD-10-CM

## 2017-08-20 DIAGNOSIS — M8589 Other specified disorders of bone density and structure, multiple sites: Secondary | ICD-10-CM | POA: Diagnosis not present

## 2017-08-20 DIAGNOSIS — K219 Gastro-esophageal reflux disease without esophagitis: Secondary | ICD-10-CM

## 2017-08-20 DIAGNOSIS — Z1211 Encounter for screening for malignant neoplasm of colon: Secondary | ICD-10-CM

## 2017-08-20 DIAGNOSIS — M810 Age-related osteoporosis without current pathological fracture: Secondary | ICD-10-CM

## 2017-08-20 DIAGNOSIS — I10 Essential (primary) hypertension: Secondary | ICD-10-CM | POA: Diagnosis not present

## 2017-08-20 DIAGNOSIS — E1165 Type 2 diabetes mellitus with hyperglycemia: Secondary | ICD-10-CM

## 2017-08-20 MED ORDER — BENAZEPRIL HCL 40 MG PO TABS: 40.0000 mg | ORAL_TABLET | Freq: Every day | ORAL | 1 refills | Status: DC

## 2017-08-20 MED ORDER — ATORVASTATIN CALCIUM 40 MG PO TABS: 40.0000 mg | ORAL_TABLET | Freq: Every day | ORAL | 1 refills | Status: DC

## 2017-08-20 MED ORDER — OMEPRAZOLE 20 MG PO CPDR: 20.0000 mg | DELAYED_RELEASE_CAPSULE | Freq: Every day | ORAL | 4 refills | Status: DC

## 2017-08-20 MED ORDER — AMLODIPINE BESYLATE 10 MG PO TABS: 5.0000 mg | ORAL_TABLET | Freq: Every day | ORAL | 0 refills | Status: DC

## 2017-08-20 MED ORDER — ATENOLOL-CHLORTHALIDONE 50-25 MG PO TABS: 1.0000 | ORAL_TABLET | Freq: Every day | ORAL | 1 refills | Status: DC

## 2017-08-20 MED ORDER — SEMAGLUTIDE(0.25 OR 0.5MG/DOS) 2 MG/1.5ML ~~LOC~~ SOPN: 0.5000 mg | PEN_INJECTOR | SUBCUTANEOUS | 1 refills | Status: DC

## 2017-08-20 MED ORDER — METFORMIN HCL 1000 MG PO TABS: 1000.0000 mg | ORAL_TABLET | Freq: Two times a day (BID) | ORAL | 1 refills | Status: DC

## 2017-08-20 NOTE — Progress Notes (Addendum)
Subjective:   Michelle Montgomery is a 67 y.o. female who presents for an Initial Medicare Annual Wellness Visit.  Review of Systems    N/A  Cardiac Risk Factors include: advanced age (>4men, >36 women);diabetes mellitus;dyslipidemia;hypertension;obesity (BMI >30kg/m2);sedentary lifestyle     Objective:    Today's Vitals   08/20/17 0954  BP: 110/64  Pulse: 72  Resp: 12  Temp: (!) 97.5 F (36.4 C)  TempSrc: Oral  SpO2: 93%  Weight: 219 lb 11.2 oz (99.7 kg)  Height: 5\' 10"  (1.778 m)   Body mass index is 31.52 kg/m.  Advanced Directives 08/20/2017 10/29/2016 06/12/2016 02/12/2016 08/24/2015 04/10/2015 01/16/2015  Does Patient Have a Medical Advance Directive? Yes Yes No No;Yes Yes Yes Yes  Type of Paramedic of [place];Living will - - Living will Living will Living will Living will  Does patient want to make changes to medical advance directive? - - - - No - Patient declined No - Patient declined -  Copy of [place] in Chart? No - copy requested - - - No - copy requested No - copy requested No - copy requested  Would patient like information on creating a medical advance directive? - - - - - No - patient declined information -    Current Medications (verified) Outpatient Encounter Medications as of 08/20/2017  Medication Sig  . amLODipine (NORVASC) 10 MG tablet Take 1 tablet (10 mg total) by mouth daily.  Marland Kitchen aspirin 81 MG tablet Take 1 tablet by mouth daily.  Marland Kitchen atenolol-chlorthalidone (TENORETIC) 50-25 MG tablet Take 1 tablet by mouth daily.  Marland Kitchen atorvastatin (LIPITOR) 40 MG tablet Take 1 tablet (40 mg total) by mouth daily.  . benazepril (LOTENSIN) 40 MG tablet Take 1 tablet (40 mg total) by mouth daily.  Marland Kitchen CALCIUM CARBONATE-VIT D-MIN PO Take 2 tablets by mouth daily. 600 mg bid  . Cholecalciferol (VITAMIN D) 2000 UNITS CAPS Take 1 tablet by mouth daily.  . Coenzyme Q10 (COQ-10 PO) Take 300 tablets by mouth daily.  Marland Kitchen conjugated estrogens  (PREMARIN) vaginal cream Place 1 Applicatorful vaginally daily.  . meloxicam (MOBIC) 15 MG tablet Take 1 tablet (15 mg total) by mouth daily. (Patient taking differently: Take 15 mg by mouth as needed. )  . metFORMIN (GLUCOPHAGE) 1000 MG tablet Take 1 tablet (1,000 mg total) by mouth 2 (two) times daily with a meal.  . Omega-3 Fatty Acids (FISH OIL CONCENTRATE) 435 MG CAPS Take 1 tablet by mouth daily.  Marland Kitchen omeprazole (PRILOSEC) 20 MG capsule Take 1 capsule (20 mg total) by mouth daily.  . Ospemifene (OSPHENA) 60 MG TABS Take 60 mg by mouth daily.  . pioglitazone (ACTOS) 15 MG tablet Take 1 tablet (15 mg total) by mouth daily.  . Semaglutide (OZEMPIC) 0.25 or 0.5 MG/DOSE SOPN Inject 0.5 mg into the skin once a week.  . triamcinolone (NASACORT AQ) 55 MCG/ACT AERO nasal inhaler Place 2 sprays into the nose daily. (Patient taking differently: Place 2 sprays into the nose as needed. )  . zolpidem (AMBIEN) 5 MG tablet Take 1 tablet (5 mg total) by mouth every evening. (Patient taking differently: Take 5 mg by mouth as needed. )   No facility-administered encounter medications on file as of 08/20/2017.     Allergies (verified) Codeine and Penicillins   History: Past Medical History:  Diagnosis Date  . Arthrosis   . Bilateral leg cramps   . Bilateral sciatica   . Chronic insomnia   . Diabetes  mellitus without complication ([place])   . Dyslipidemia   . History of colon polyps   . History of skin cancer    Dr. Ave Filter, history of basal cell and squamous cells carcinoma  . Hypertension   . Microalbuminuria    100-DM  . Obesity   . Plantar fasciitis, left    Dr. Milinda Pointer  . Reflux   . Right bundle branch block    Past Surgical History:  Procedure Laterality Date  . BREAST BIOPSY Left 2015   benign  . FOOT SURGERY Left 08/29/2011   Spur Removal , and Achilles Tendon Tendolysis  . VARICOSE VEIN SURGERY Bilateral    Family History  Problem Relation Age of Onset  . Cancer Mother         Colon and breast  . Anemia Mother   . Hypertension Mother   . Cancer Father        Esophageal  . Heart disease Father   . Diabetes Brother   . Cancer Maternal Uncle        Colon  . Cancer Maternal Grandmother        Colon  . Healthy Brother   . Melanoma Brother    Social History   Socioeconomic History  . Marital status: Married    Spouse name: Iona Beard  . Number of children: 2  . Years of education: Not on file  . Highest education level: 12th grade  Occupational History  . Occupation: Retired  Scientific laboratory technician  . Financial resource strain: Not hard at all  . Food insecurity:    Worry: Never true    Inability: Never true  . Transportation needs:    Medical: No    Non-medical: No  Tobacco Use  . Smoking status: Former Smoker    Packs/day: 0.25    Years: 2.00    Pack years: 0.50    Types: Cigarettes    Last attempt to quit: 02/10/1970    Years since quitting: 47.5  . Smokeless tobacco: Never Used  . Tobacco comment: smoking cessation materials not required  Substance and Sexual Activity  . Alcohol use: No    Alcohol/week: 0.0 oz  . Drug use: No  . Sexual activity: Yes  Lifestyle  . Physical activity:    Days per week: 0 days    Minutes per session: 0 min  . Stress: Not at all  Relationships  . Social connections:    Talks on phone: Patient refused    Gets together: Patient refused    Attends religious service: Patient refused    Active member of club or organization: Patient refused    Attends meetings of clubs or organizations: Patient refused    Relationship status: Married  Other Topics Concern  . Not on file  Social History Narrative  . Not on file    Tobacco Counseling Counseling given: No Comment: smoking cessation materials not required  Clinical Intake:  Pre-visit preparation completed: Yes  Pain : No/denies pain   BMI - recorded: 31.52 Nutritional Status: BMI > 30  Obese Nutritional Risks: None  Nutrition Risk Assessment: Has the  patient had any N/V/D within the last 2 months?  No Does the patient have any non-healing wounds?  No Has the patient had any unintentional weight loss or weight gain?  No  Is the patient diabetic?  Yes If diabetic, was a CBG obtained today?  No Did the patient bring in their glucometer from home?  No Comments: Pt monitors CBG's daily. Denies  any financial strains with the device or supplies.  Diabetic Exams: Diabetic Eye Exam: Completed 05/18/17 Diabetic Foot Exam: Completed 03/18/17  How often do you need to have someone help you when you read instructions, pamphlets, or other written materials from your doctor or pharmacy?: 1 - Never  Interpreter Needed?: No  Information entered by :: AE[place]sole, LPN   Activities of Daily Living In your present state of health, do you have any difficulty performing the following activities: 08/20/2017 03/18/2017  Hearing? N N  Comment denies hearing aids -  Vision? N N  Comment wears eyeglasses -  Difficulty concentrating or making decisions? N N  Walking or climbing stairs? N N  Dressing or bathing? N N  Doing errands, shopping? N N  Preparing Food and eating ? N -  Comment denies dentures -  Using the Toilet? N -  In the past six months, have you accidently leaked urine? Y -  Comment urgency -  Do you have problems with loss of bowel control? N -  Managing your Medications? N -  Managing your Finances? N -  Housekeeping or managing your Housekeeping? N -  Some recent data might be hidden     Immunizations and Health Maintenance Immunization History  Administered Date(s) Administered  . Influenza, High Dose Seasonal PF 11/06/2014, 10/29/2016  . Influenza,inj,Quad PF,6+ Mos 11/06/2014  . Influenza-Unspecified 11/09/2013, 12/13/2015  . Pneumococcal Conjugate-13 02/28/2014  . Pneumococcal Polysaccharide-23 02/21/2010, 04/10/2015  . Tdap 01/07/2010  . Zoster 10/27/2011   Health Maintenance Due  Topic Date Due  . COLONOSCOPY  10/10/2015   . MAMMOGRAM  07/17/2017    Patient Care Team: Steele Sizer, MD as PCP - General (Family Medicine) Rubie Maid, MD as Consulting Physician (Obstetrics and Gynecology) Christene Slates, MD as Consulting Physician (Dermatology)  Indicate any recent Medical Services you may have received from other than [facility] providers in the past year (date may be approximate).     Assessment:   This is a routine wellness examination for Michelle Montgomery.  Hearing/Vision screen Vision Screening Comments: Sees Dr. Gloriann Loan for annual eye exams  Dietary issues and exercise activities discussed: Current Exercise Habits: The patient does not participate in regular exercise at present, Exercise limited by: None identified  Goals    . DIET - INCREASE WATER INTAKE     Recommend to drink at least 6-8 8oz glasses of water per day.      Depression Screen PHQ 2/9 Scores 08/20/2017 03/18/2017 10/29/2016 02/12/2016 08/24/2015 04/10/2015 01/16/2015  PHQ - 2 Score 0 0 0 0 0 0 0  PHQ- 9 Score 0 - - - - - -    Fall Risk Fall Risk  08/20/2017 03/18/2017 10/29/2016 02/12/2016 08/24/2015  Falls in the past year? No No No No No  Risk for fall due to : Impaired vision - - - -  Risk for fall due to: Comment wears eyeglasses - - - -    FALL RISK PREVENTION PERTAINING TO HOME: Is your home free of loose throw rugs in walkways, pet beds, electrical cords, etc? Yes Is there adequate lighting in your home to reduce risk of falls?  Yes Are there stairs in or around your home WITH handrails? Yes  ASSISTIVE DEVICES UTILIZED TO PREVENT FALLS: Use of a cane, walker or w/c? No Grab bars in the bathroom? No  Shower chair or a place to sit while bathing? Yes An elevated toilet seat or a handicapped toilet? Yes  Timed Get Up and Go Performed:  Yes. Pt ambulated 10 feet within 8 sec. Gait stead-fast and without the use of an assistive device. No intervention required at this time. Fall risk prevention has been discussed.  Community Resource  Referral:  Pt declined my offer to send [employer] Referral to Care Guide for installation of grab bars in the shower.  Cognitive Function:     6CIT Screen 08/20/2017  What Year? 0 points  What month? 0 points  What time? 0 points  Count back from 20 0 points  Months in reverse 0 points  Repeat phrase 2 points  Total Score 2    Screening Tests Health Maintenance  Topic Date Due  . COLONOSCOPY  10/10/2015  . MAMMOGRAM  07/17/2017  . INFLUENZA VACCINE  09/10/2017  . HEMOGLOBIN A1C  09/15/2017  . FOOT EXAM  03/18/2018  . OPHTHALMOLOGY EXAM  05/19/2018  . TETANUS/TDAP  01/08/2020  . DEXA SCAN  Completed  . Hepatitis C Screening  Completed  . PNA vac Low Risk Adult  Completed    Qualifies for Shingles Vaccine? Yes. Zostavax completed 10/27/11. Due for Shingrix. Education has been provided regarding the importance of this vaccine. Pt has been advised to call her insurance company to determine her out of pocket expense. Advised she may also receive this vaccine at her local pharmacy or Health Dept. Verbalized acceptance and understanding.  Cancer Screenings: Lung: Low Dose CT Chest recommended if Age 37-80 years, 30 pack-year currently smoking OR have quit w/in 15years. Patient does not qualify. Breast: Up to date on Mammogram? No. Completed 07/17/16. Repeat every year. Ordered today. Message sent to referral coordinator for scheduling purposes.   Up to date of Bone Density/Dexa? No/ Completed 03/02/14. Repeat every 2 years. Ordered today. Message sent to referral coordinator for scheduling purposes.   Colorectal: Completed 10/10/10. Repeat every 5 years. GI referral placed today. Message sent to referral coordinator for scheduling purposes.   Additional Screenings: Hepatitis C Screening: Completed 10/27/11   Plan:  I have personally reviewed and addressed the Medicare Annual Wellness questionnaire and have noted the following in the patient's chart:  A. Medical and social  history B. Use of alcohol, tobacco or illicit drugs  C. Current medications and supplements D. Functional ability and status E.  Nutritional status F.  Physical activity G. Advance directives H. List of other physicians I.  Hospitalizations, surgeries, and ER visits in previous 12 months J.  [place] such as hearing and vision if needed, cognitive and depression L. Referrals and appointments  In addition, I have reviewed and discussed with patient certain preventive protocols, quality metrics, and best practice recommendations. A written personalized care plan for preventive services as well as general preventive health recommendations were provided to patient.  See attached scanned questionnaire for additional information.   Signed,  Aleatha Borer, LPN Nurse Health Advisor   I have reviewed this encounter including the documentation in this note and/or discussed this patient with the provider, Aleatha Borer, LPN. I am certifying that I agree with the content of this note as supervising physician.  Steele Sizer, MD [facility] 08/20/2017, 1:00 PM

## 2017-08-20 NOTE — Patient Instructions (Addendum)
Michelle Montgomery , Thank you for taking time to come for your Medicare Wellness Visit. I appreciate your ongoing commitment to your health goals. Please review the following plan we discussed and let me know if I can assist you in the future.   Screening recommendations/referrals: Colorectal Screening: You will receive a call from our office regarding your appointment Mammogram: You will receive a call from our office regarding your appointment Bone Density: You will receive a call from our office regarding your appointment  Vision and Dental Exams: Recommended annual ophthalmology exams for early detection of glaucoma and other disorders of the eye Recommended annual dental exams for proper oral hygiene  Diabetic Exams: Recommended annual diabetic eye exams for early detection of retinopathy Recommended annual diabetic foot exams for early detection of peripheral neuropathy.  Diabetic Eye Exam: Up to date Diabetic Foot Exam: Up to date  Vaccinations: Influenza vaccine: Up to date Pneumococcal vaccine: Up to date Tdap vaccine: Up to date Shingles vaccine: Please call your insurance company to determine your out of pocket expense for the Shingrix vaccine. You may also receive this vaccine at your local pharmacy or Health Dept.  Advanced directives: Please bring a copy of your POA (Power of Attorney) and/or Living Will to your next appointment.  Goals: Recommend to drink at least 6-8 8oz glasses of water per day.  Next appointment: Please schedule your Annual Wellness Visit with your Nurse Health Advisor in one year.  Preventive Care 69 Years and Older, Female Preventive care refers to lifestyle choices and visits with your health care provider that can promote health and wellness. What does preventive care include?  A yearly physical exam. This is also called an annual well check.  Dental exams once or twice a year.  Routine eye exams. Ask your health care provider how often you  should have your eyes checked.  Personal lifestyle choices, including:  Daily care of your teeth and gums.  Regular physical activity.  Eating a healthy diet.  Avoiding tobacco and drug use.  Limiting alcohol use.  Practicing safe sex.  Taking low-dose aspirin every day.  Taking vitamin and mineral supplements as recommended by your health care provider. What happens during an annual well check? The services and screenings done by your health care provider during your annual well check will depend on your age, overall health, lifestyle risk factors, and family history of disease. Counseling  Your health care provider may ask you questions about your:  Alcohol use.  Tobacco use.  Drug use.  Emotional well-being.  Home and relationship well-being.  Sexual activity.  Eating habits.  History of falls.  Memory and ability to understand (cognition).  Work and work Statistician.  Reproductive health. Screening  You may have the following tests or measurements:  Height, weight, and BMI.  Blood pressure.  Lipid and cholesterol levels. These may be checked every 5 years, or more frequently if you are over 41 years old.  Skin check.  Lung cancer screening. You may have this screening every year starting at age 33 if you have a 30-pack-year history of smoking and currently smoke or have quit within the past 15 years.  Fecal occult blood test (FOBT) of the stool. You may have this test every year starting at age 85.  Flexible sigmoidoscopy or colonoscopy. You may have a sigmoidoscopy every 5 years or a colonoscopy every 10 years starting at age 76.  Hepatitis C blood test.  Hepatitis B blood test.  Sexually transmitted disease (  STD) testing.  Diabetes screening. This is done by checking your blood sugar (glucose) after you have not eaten for a while (fasting). You may have this done every 1-3 years.  Bone density scan. This is done to screen for osteoporosis.  You may have this done starting at age 62.  Mammogram. This may be done every 1-2 years. Talk to your health care provider about how often you should have regular mammograms. Talk with your health care provider about your test results, treatment options, and if necessary, the need for more tests. Vaccines  Your health care provider may recommend certain vaccines, such as:  Influenza vaccine. This is recommended every year.  Tetanus, diphtheria, and acellular pertussis (Tdap, Td) vaccine. You may need a Td booster every 10 years.  Zoster vaccine. You may need this after age 10.  Pneumococcal 13-valent conjugate (PCV13) vaccine. One dose is recommended after age 35.  Pneumococcal polysaccharide (PPSV23) vaccine. One dose is recommended after age 21. Talk to your health care provider about which screenings and vaccines you need and how often you need them. This information is not intended to replace advice given to you by your health care provider. Make sure you discuss any questions you have with your health care provider. Document Released: 02/23/2015 Document Revised: 10/17/2015 Document Reviewed: 11/28/2014 Elsevier Interactive Patient Education  2017 [place] Prevention in the Home Falls can cause injuries. They can happen to people of all ages. There are many things you can do to make your home safe and to help prevent falls. What can I do on the outside of my home?  Regularly fix the edges of walkways and driveways and fix any cracks.  Remove anything that might make you trip as you walk through a door, such as a raised step or threshold.  Trim any bushes or trees on the path to your home.  Use bright outdoor lighting.  Clear any walking paths of anything that might make someone trip, such as rocks or tools.  Regularly check to see if handrails are loose or broken. Make sure that both sides of any steps have handrails.  Any raised decks and porches should have  guardrails on the edges.  Have any leaves, snow, or ice cleared regularly.  Use sand or salt on walking paths during winter.  Clean up any spills in your garage right away. This includes oil or grease spills. What can I do in the bathroom?  Use night lights.  Install grab bars by the toilet and in the tub and shower. Do not use towel bars as grab bars.  Use non-skid mats or decals in the tub or shower.  If you need to sit down in the shower, use a plastic, non-slip stool.  Keep the floor dry. Clean up any water that spills on the floor as soon as it happens.  Remove soap buildup in the tub or shower regularly.  Attach bath mats securely with double-sided non-slip rug tape.  Do not have throw rugs and other things on the floor that can make you trip. What can I do in the bedroom?  Use night lights.  Make sure that you have a light by your bed that is easy to reach.  Do not use any sheets or blankets that are too big for your bed. They should not hang down onto the floor.  Have a firm chair that has side arms. You can use this for support while you get dressed.  Do  not have throw rugs and other things on the floor that can make you trip. What can I do in the kitchen?  Clean up any spills right away.  Avoid walking on wet floors.  Keep items that you use a lot in easy-to-reach places.  If you need to reach something above you, use a strong step stool that has a grab bar.  Keep electrical cords out of the way.  Do not use floor polish or wax that makes floors slippery. If you must use wax, use non-skid floor wax.  Do not have throw rugs and other things on the floor that can make you trip. What can I do with my stairs?  Do not leave any items on the stairs.  Make sure that there are handrails on both sides of the stairs and use them. Fix handrails that are broken or loose. Make sure that handrails are as long as the stairways.  Check any carpeting to make sure that  it is firmly attached to the stairs. Fix any carpet that is loose or worn.  Avoid having throw rugs at the top or bottom of the stairs. If you do have throw rugs, attach them to the floor with carpet tape.  Make sure that you have a light switch at the top of the stairs and the bottom of the stairs. If you do not have them, ask someone to add them for you. What else can I do to help prevent falls?  Wear shoes that:  Do not have high heels.  Have rubber bottoms.  Are comfortable and fit you well.  Are closed at the toe. Do not wear sandals.  If you use a stepladder:  Make sure that it is fully opened. Do not climb a closed stepladder.  Make sure that both sides of the stepladder are locked into place.  Ask someone to hold it for you, if possible.  Clearly mark and make sure that you can see:  Any grab bars or handrails.  First and last steps.  Where the edge of each step is.  Use tools that help you move around (mobility aids) if they are needed. These include:  Canes.  Walkers.  Scooters.  Crutches.  Turn on the lights when you go into a dark area. Replace any light bulbs as soon as they burn out.  Set up your furniture so you have a clear path. Avoid moving your furniture around.  If any of your floors are uneven, fix them.  If there are any pets around you, be aware of where they are.  Review your medicines with your doctor. Some medicines can make you feel dizzy. This can increase your chance of falling. Ask your doctor what other things that you can do to help prevent falls. This information is not intended to replace advice given to you by your health care provider. Make sure you discuss any questions you have with your health care provider. Document Released: 11/23/2008 Document Revised: 07/05/2015 Document Reviewed: 03/03/2014 Elsevier Interactive Patient Education  2017 [employer].

## 2017-08-20 NOTE — Progress Notes (Signed)
Name: Michelle Montgomery   MRN: [id number]    DOB: 1950/03/29   Date:08/20/2017       Progress Note  Subjective  Chief Complaint  Chief Complaint  Patient presents with  . Diabetes  . Hypertension  . Obesity    HPI  DMII with nephropathy: hgbA1C has been the same for over one year, above 8%, , she is off Glipizide, Metformin and Actos, started on Ozempic 03/2017 hgbA1C was 8.2%. She denies polyphagia, polydipsia or polyuria.   HTN: taking bp medication as prescribed, denies chest pain, palpitation, edema, dizziness or SOB . BP has been towards low end of normal, therefore we will try decreasing dose of norvasc from 10 mg to 5 mg and she will return in one week for bp check before we send refills of new dose to pharmacy  Hyperlipidemia: taking Atorvastatin, she denies myalgia. She is compliant and we will check labs   Insomnia: taking Ambien prn, states it helps her fall asleep and stay asleep. Denies side effects, discussed risk and she states she does not want to stop medication. Unchanged   Lack of sense of smell and taste: symptoms started about about one year ago. She still can't smell dirty diapers, skunk smell or her dogs bad breath. She saw ENT and is on a different nasal steroid and states she is smelling a little better now. He suggested MRI brain but she decided to hold off She states she has noticed mild improvement lately, but she still does not want to have MRI, she states she will contact ENT   GERD: taking Omeprazole, and no heartburn or regurgitation noticed. Discussed long term use of PPI, dementia, cardiovascular disease, dementia and osteoporosis. She tried weaning self off and switching to Zantac but symptoms returned, so she is back on PPI. Cannot even drink water without PPI. She continues to take daily Omeprazole and is doing well at this time  Obesity:she has multiple co-morbidities, so she has morbid obesity, she lost 6 lbs since last visit, she has  noticed that [place] curbs her appetite  OA: she is doing very well on Meloxicam prn,takes for about 2 weeks and stops after thatNo side effects. Unchanged   Patient Active Problem List   Diagnosis Date Noted  . Varicose veins of both lower extremities 06/12/2016  . BPV (benign positional vertigo), right 08/24/2015  . Smell disturbance 01/16/2015  . Osteoarthritis 11/03/2014  . Cramps of lower extremity 11/03/2014  . Neuralgia neuritis, sciatic nerve 11/03/2014  . Insomnia, persistent 11/03/2014  . Dyslipidemia 11/03/2014  . Type 2 diabetes with nephropathy ([place]) 11/03/2014  . Essential (primary) hypertension 11/03/2014  . History of colon polyps 11/03/2014  . H/O malignant neoplasm of skin 11/03/2014  . Microalbuminuria 11/03/2014  . Obesity, morbid ([place]) 11/03/2014  . Osteopenia 11/03/2014  . Plantar fasciitis 11/03/2014  . Gastro-esophageal reflux disease without esophagitis 11/03/2014  . Bundle branch block, right 11/03/2014    Past Surgical History:  Procedure Laterality Date  . BREAST BIOPSY Left 2015   benign  . FOOT SURGERY Left 08/29/2011   Spur Removal , and Achilles Tendon Tendolysis  . VARICOSE VEIN SURGERY Bilateral     Family History  Problem Relation Age of Onset  . Cancer Mother        Colon and breast  . Anemia Mother   . Hypertension Mother   . Cancer Father        Esophageal  . Heart disease Father   . Diabetes Brother   .  Cancer Maternal Uncle        Colon  . Cancer Maternal Grandmother        Colon  . Healthy Brother   . Melanoma Brother     Social History   Socioeconomic History  . Marital status: Married    Spouse name: Iona Beard  . Number of children: 2  . Years of education: Not on file  . Highest education level: 12th grade  Occupational History  . Occupation: Retired  Scientific laboratory technician  . Financial resource strain: Not hard at all  . Food insecurity:    Worry: Never true    Inability: Never true  . Transportation needs:     Medical: No    Non-medical: No  Tobacco Use  . Smoking status: Former Smoker    Packs/day: 0.25    Years: 2.00    Pack years: 0.50    Types: Cigarettes    Last attempt to quit: 02/10/1970    Years since quitting: 47.5  . Smokeless tobacco: Never Used  . Tobacco comment: smoking cessation materials not required  Substance and Sexual Activity  . Alcohol use: No    Alcohol/week: 0.0 oz  . Drug use: No  . Sexual activity: Yes  Lifestyle  . Physical activity:    Days per week: 0 days    Minutes per session: 0 min  . Stress: Not at all  Relationships  . Social connections:    Talks on phone: Patient refused    Gets together: Patient refused    Attends religious service: Patient refused    Active member of club or organization: Patient refused    Attends meetings of clubs or organizations: Patient refused    Relationship status: Married  . Intimate partner violence:    Fear of current or ex partner: No    Emotionally abused: No    Physically abused: No    Forced sexual activity: No  Other Topics Concern  . Not on file  Social History Narrative  . Not on file     Current Outpatient Medications:  .  amLODipine (NORVASC) 10 MG tablet, Take 0.5 tablets (5 mg total) by mouth daily., Disp: 90 tablet, Rfl: 0 .  aspirin 81 MG tablet, Take 1 tablet by mouth daily., Disp: , Rfl:  .  atenolol-chlorthalidone (TENORETIC) 50-25 MG tablet, Take 1 tablet by mouth daily., Disp: 90 tablet, Rfl: 1 .  atorvastatin (LIPITOR) 40 MG tablet, Take 1 tablet (40 mg total) by mouth daily., Disp: 90 tablet, Rfl: 1 .  benazepril (LOTENSIN) 40 MG tablet, Take 1 tablet (40 mg total) by mouth daily., Disp: 90 tablet, Rfl: 1 .  CALCIUM CARBONATE-VIT D-MIN PO, Take 2 tablets by mouth daily. 600 mg bid, Disp: , Rfl:  .  Cholecalciferol (VITAMIN D) 2000 UNITS CAPS, Take 1 tablet by mouth daily., Disp: , Rfl:  .  Coenzyme Q10 (COQ-10 PO), Take 300 tablets by mouth daily., Disp: , Rfl:  .  conjugated estrogens  (PREMARIN) vaginal cream, Place 1 Applicatorful vaginally daily., Disp: , Rfl:  .  meloxicam (MOBIC) 15 MG tablet, Take 1 tablet (15 mg total) by mouth daily. (Patient taking differently: Take 15 mg by mouth as needed. ), Disp: 90 tablet, Rfl: 0 .  metFORMIN (GLUCOPHAGE) 1000 MG tablet, Take 1 tablet (1,000 mg total) by mouth 2 (two) times daily with a meal., Disp: 180 tablet, Rfl: 1 .  Omega-3 Fatty Acids (FISH OIL CONCENTRATE) 435 MG CAPS, Take 1 tablet by mouth daily., Disp: ,  Rfl:  .  omeprazole (PRILOSEC) 20 MG capsule, Take 1 capsule (20 mg total) by mouth daily., Disp: 90 capsule, Rfl: 4 .  Ospemifene (OSPHENA) 60 MG TABS, Take 60 mg by mouth daily., Disp: , Rfl:  .  pioglitazone (ACTOS) 15 MG tablet, Take 1 tablet (15 mg total) by mouth daily., Disp: 90 tablet, Rfl: 1 .  Semaglutide (OZEMPIC) 0.25 or 0.5 MG/DOSE SOPN, Inject 0.5 mg into the skin once a week., Disp: 3 pen, Rfl: 1 .  triamcinolone (NASACORT AQ) 55 MCG/ACT AERO nasal inhaler, Place 2 sprays into the nose daily. (Patient taking differently: Place 2 sprays into the nose as needed. ), Disp: 1 Inhaler, Rfl: 0 .  zolpidem (AMBIEN) 5 MG tablet, Take 1 tablet (5 mg total) by mouth every evening. (Patient taking differently: Take 5 mg by mouth as needed. ), Disp: 90 tablet, Rfl: 0  Allergies  Allergen Reactions  . Codeine   . Penicillins      ROS  Constitutional: Negative for fever , positive for mild  weight change.  Respiratory: Negative for cough and shortness of breath.   Cardiovascular: Negative for chest pain or palpitations.  Gastrointestinal: Negative for abdominal pain, no bowel changes.  Musculoskeletal: Negative for gait problem or joint swelling.  Skin: Negative for rash.  Neurological: Negative for dizziness or headache.  No other specific complaints in a complete review of systems (except as listed in HPI above).  Objective  Vitals:   08/20/17 1048  BP: 110/64  Pulse: 72  Resp: 12  Temp: (!) 97.5 F  (36.4 C)  TempSrc: Oral  SpO2: 93%  Weight: 219 lb 11.2 oz (99.7 kg)  Height: 5\' 10"  (1.778 m)    Body mass index is 31.52 kg/m.  Physical Exam  Constitutional: Patient appears well-developed and well-nourished. Obese No distress.  HEENT: head atraumatic, normocephalic, pupils equal and reactive to light, neck supple, throat within normal limits Cardiovascular: Normal rate, regular rhythm and normal heart sounds.  No murmur heard. No BLE edema. Pulmonary/Chest: Effort normal and breath sounds normal. No respiratory distress. Abdominal: Soft.  There is no tenderness. Psychiatric: Patient has a normal mood and affect. behavior is normal. Judgment and thought content normal. Muscular skeletal: mild effusion both knees, crepitus with extension of both knees    Recent Results (from the past 2160 hour(s))  Cytology - PAP     Status: None   Collection Time: 08/11/17 12:00 AM  Result Value Ref Range   Adequacy      Satisfactory for evaluation  endocervical/transformation zone component PRESENT.   Diagnosis      NEGATIVE FOR INTRAEPITHELIAL LESIONS OR MALIGNANCY. BENIGN REACTIVE/REPARATIVE CHANGES.   Material Submitted CervicoVaginal Pap [ThinPrep Imaged]    CYTOLOGY - PAP PAP RESULT       PHQ2/9: Depression screen [facility] 2/9 08/20/2017 03/18/2017 10/29/2016 02/12/2016 08/24/2015  Decreased Interest 0 0 0 0 0  Down, Depressed, Hopeless 0 0 0 0 0  PHQ - 2 Score 0 0 0 0 0  Altered sleeping 0 - - - -  Tired, decreased energy 0 - - - -  Change in appetite 0 - - - -  Feeling bad or failure about yourself  0 - - - -  Trouble concentrating 0 - - - -  Moving slowly or fidgety/restless 0 - - - -  Suicidal thoughts 0 - - - -  PHQ-9 Score 0 - - - -  Difficult doing work/chores Not difficult at all - - - -  Fall Risk: Fall Risk  08/20/2017 03/18/2017 10/29/2016 02/12/2016 08/24/2015  Falls in the past year? No No No No No  Risk for fall due to : Impaired vision - - - -  Risk for fall due to:  Comment wears eyeglasses - - - -      Assessment & Plan  1. Uncontrolled type 2 diabetes mellitus with nephropathy (HCC)  - Hemoglobin A1c - metFORMIN (GLUCOPHAGE) 1000 MG tablet; Take 1 tablet (1,000 mg total) by mouth 2 (two) times daily with a meal.  Dispense: 180 tablet; Refill: 1 - Semaglutide (OZEMPIC) 0.25 or 0.5 MG/DOSE SOPN; Inject 0.5 mg into the skin once a week.  Dispense: 3 pen; Refill: 1  2. Dyslipidemia  - Lipid panel - atorvastatin (LIPITOR) 40 MG tablet; Take 1 tablet (40 mg total) by mouth daily.  Dispense: 90 tablet; Refill: 1  3. Essential (primary) hypertension  - CBC with Differential/Platelet - COMPLETE METABOLIC PANEL WITH GFR - amLODipine (NORVASC) 10 MG tablet; Take 0.5 tablets (5 mg total) by mouth daily.  Dispense: 90 tablet; Refill: 0 - atenolol-chlorthalidone (TENORETIC) 50-25 MG tablet; Take 1 tablet by mouth daily.  Dispense: 90 tablet; Refill: 1 - benazepril (LOTENSIN) 40 MG tablet; Take 1 tablet (40 mg total) by mouth daily.  Dispense: 90 tablet; Refill: 1  4. Morbid obesity, unspecified obesity type [facility])  Discussed with the patient the risk posed by an increased BMI. Discussed importance of portion control, calorie counting and at least 150 minutes of physical activity weekly. Avoid sweet beverages and drink more water. Eat at least 6 servings of fruit and vegetables daily   5. Microalbuminuria  - benazepril (LOTENSIN) 40 MG tablet; Take 1 tablet (40 mg total) by mouth daily.  Dispense: 90 tablet; Refill: 1  6. Gastro-esophageal reflux disease without esophagitis  - omeprazole (PRILOSEC) 20 MG capsule; Take 1 capsule (20 mg total) by mouth daily.  Dispense: 90 capsule; Refill: 4  7. Ovarian failure   8. Osteopenia after menopause  - DG Bone Density

## 2017-08-21 LAB — COMPLETE METABOLIC PANEL WITH GFR
AG RATIO: 2.2 (calc) (ref 1.0–2.5)
ALT: 20 U/L (ref 6–29)
AST: 17 U/L (ref 10–35)
Albumin: 4.9 g/dL (ref 3.6–5.1)
Alkaline phosphatase (APISO): 39 U/L (ref 33–130)
BUN: 19 mg/dL (ref 7–25)
CALCIUM: 10 mg/dL (ref 8.6–10.4)
CO2: 30 mmol/L (ref 20–32)
Chloride: 101 mmol/L (ref 98–110)
Creat: 0.78 mg/dL (ref 0.50–0.99)
GFR, EST NON AFRICAN AMERICAN: 78 mL/min/{1.73_m2} (ref >=60)
GFR, Est African American: 91 mL/min/{1.73_m2} (ref >=60)
GLOBULIN: 2.2 g/dL (ref 1.9–3.7)
GLUCOSE: 118 mg/dL — AB (ref 65–99)
Potassium: 4.7 mmol/L (ref 3.5–5.3)
SODIUM: 140 mmol/L (ref 135–146)
TOTAL PROTEIN: 7.1 g/dL (ref 6.1–8.1)
Total Bilirubin: 0.6 mg/dL (ref 0.2–1.2)

## 2017-08-21 LAB — CBC WITH DIFFERENTIAL/PLATELET
BASOS PCT: 0.8 %
Basophils Absolute: 41 cells/uL (ref 0–200)
Eosinophils Absolute: 148 cells/uL (ref 15–500)
Eosinophils Relative: 2.9 %
HCT: 38.9 % (ref 35.0–45.0)
Hemoglobin: 13.2 g/dL (ref 11.7–15.5)
Lymphs Abs: 1418 cells/uL (ref 850–3900)
MCH: 30.9 pg (ref 27.0–33.0)
MCHC: 33.9 g/dL (ref 32.0–36.0)
MCV: 91.1 fL (ref 80.0–100.0)
MONOS PCT: 9.4 %
MPV: 8.8 fL (ref 7.5–12.5)
Neutro Abs: 3014 cells/uL (ref 1500–7800)
Neutrophils Relative %: 59.1 %
PLATELETS: 213 10*3/uL (ref 140–400)
RBC: 4.27 10*6/uL (ref 3.80–5.10)
RDW: 12.8 % (ref 11.0–15.0)
Total Lymphocyte: 27.8 %
WBC mixed population: 479 cells/uL (ref 200–950)
WBC: 5.1 10*3/uL (ref 3.8–10.8)

## 2017-08-21 LAB — HEMOGLOBIN A1C
HEMOGLOBIN A1C: 6.6 %{Hb} — AB (ref <5.7)
Mean Plasma Glucose: 143 (calc)
eAG (mmol/L): 7.9 (calc)

## 2017-08-21 LAB — LIPID PANEL
CHOL/HDL RATIO: 2.7 (calc) (ref <5.0)
Cholesterol: 107 mg/dL (ref <200)
HDL: 40 mg/dL — ABNORMAL LOW (ref >50)
LDL Cholesterol (Calc): 46 mg/dL (calc)
Non-HDL Cholesterol (Calc): 67 mg/dL (calc) (ref <130)
Triglycerides: 128 mg/dL (ref <150)

## 2017-09-11 ENCOUNTER — Encounter: Payer: Medicare PPO | Admitting: Obstetrics and Gynecology

## 2017-09-15 ENCOUNTER — Ambulatory Visit
Admission: RE | Admit: 2017-09-15 | Discharge: 2017-09-15 | Disposition: A | Discharge status: Home / Self Care | Payer: Medicare PPO | Source: Ambulatory Visit | Attending: Family Medicine | Admitting: Family Medicine

## 2017-09-15 DIAGNOSIS — M810 Age-related osteoporosis without current pathological fracture: Secondary | ICD-10-CM | POA: Insufficient documentation

## 2017-09-15 DIAGNOSIS — Z1239 Encounter for other screening for malignant neoplasm of breast: Secondary | ICD-10-CM

## 2017-09-15 DIAGNOSIS — Z1231 Encounter for screening mammogram for malignant neoplasm of breast: Secondary | ICD-10-CM | POA: Insufficient documentation

## 2017-09-30 ENCOUNTER — Encounter: Payer: Medicare PPO | Admitting: Obstetrics and Gynecology

## 2017-12-22 ENCOUNTER — Ambulatory Visit: Payer: Medicare PPO | Admitting: Family Medicine

## 2018-01-05 ENCOUNTER — Ambulatory Visit: Payer: Medicare PPO | Admitting: Family Medicine

## 2018-01-05 ENCOUNTER — Encounter: Payer: Self-pay | Admitting: Family Medicine

## 2018-01-05 VITALS — BP 126/66 | HR 84 | Temp 98.0°F | Resp 16 | Ht 70.0 in | Wt 225.7 lb

## 2018-01-05 DIAGNOSIS — K5900 Constipation, unspecified: Secondary | ICD-10-CM | POA: Diagnosis not present

## 2018-01-05 DIAGNOSIS — I10 Essential (primary) hypertension: Secondary | ICD-10-CM

## 2018-01-05 DIAGNOSIS — G4700 Insomnia, unspecified: Secondary | ICD-10-CM

## 2018-01-05 DIAGNOSIS — M150 Primary generalized (osteo)arthritis: Secondary | ICD-10-CM

## 2018-01-05 DIAGNOSIS — M159 Polyosteoarthritis, unspecified: Secondary | ICD-10-CM

## 2018-01-05 DIAGNOSIS — E785 Hyperlipidemia, unspecified: Secondary | ICD-10-CM

## 2018-01-05 DIAGNOSIS — E1121 Type 2 diabetes mellitus with diabetic nephropathy: Secondary | ICD-10-CM | POA: Diagnosis not present

## 2018-01-05 MED ORDER — AMLODIPINE BESYLATE 5 MG PO TABS: 5.0000 mg | ORAL_TABLET | Freq: Every day | ORAL | 0 refills | Status: DC

## 2018-01-05 MED ORDER — PSYLLIUM 0.52 G PO CAPS: 0.5200 g | ORAL_CAPSULE | Freq: Every day | ORAL | 0 refills | Status: DC

## 2018-01-05 NOTE — Progress Notes (Signed)
Name: Michelle Montgomery   MRN: [id number]    DOB: August 27, 1950   Date:01/05/2018       Progress Note  Subjective  Chief Complaint  Chief Complaint  Patient presents with  . Medication Refill  . Diabetes    Checks daily, Lowest 109 Average-120 Highest-143  . Hypertension    Denies any symptoms  . Hyperlipidemia  . Insomnia    Quit taking Ambien-Now taking Melatonin   . Obesity  . Osteoarthritis  . Gastroesophageal Reflux    HPI   DMII with nephropathy: hgbA1C was above 8% for about one year, she is off Glipizide but still taking Metformin and Actos, started on Ozempic 03/2017 hgbA1C was 8.2% and glucose went down to 6.6% She denies polyphagia, polydipsia but has polyuria. She states nausea started with  Ozempic and also noticed constipation but symptoms have improved with metamucil and also avoids taking pills in am's to control nausea. Glucose at home has been well controlled lately. She states still splurges at times.   HTN: taking bp medication as prescribed, denies chest pain, palpitation, edema, dizzinessor SOB . We decreased dose of Norvasc to 5 mg and bp is still at goal.   Hyperlipidemia: taking Atorvastatin, she denies myalgia. She is compliant , reviewed last labs.   Insomnia: she is now off Ambien, taking melatonin and sleeps well most of the time.   Lack of sense of smell and taste: symptoms started about about one year ago. She still can't smell dirty diapers, skunk smell or her dogs bad breath. She saw ENT and is on a different nasal steroid and states she is smelling a little better now. He suggested MRI brain but she decided to hold off She states she has noticed mild improvement lately. She states thought she met deductible but not yet.   GERD: taking Omeprazole, and no heartburn or regurgitation noticed. Discussed long term use of PPI, dementia, cardiovascular disease, dementia and osteoporosis. She tried weaning self off and switching to Zantac but  symptoms returned, so she is back on PPI. Cannot even drink water without PPI. Unchanged    Obesity:she has multiple co-morbidities, so she has morbid obesity, she had  lost 6 lbs on her last visit, but gained it back since, states had a flare of OA and could not exercise for a period of time and was also eating out a lot.  OA: she is doing very well on Meloxicam prn,takes for about 2 weeks and stops after thatNo side effects. Unchanged   Osteopenia: FRAX 9.3% of major fracture and 1.2% of hip fracture, continue vitamin D and walking.    Patient Active Problem List   Diagnosis Date Noted  . Varicose veins of both lower extremities 06/12/2016  . BPV (benign positional vertigo), right 08/24/2015  . Smell disturbance 01/16/2015  . Osteoarthritis 11/03/2014  . Cramps of lower extremity 11/03/2014  . Neuralgia neuritis, sciatic nerve 11/03/2014  . Insomnia, persistent 11/03/2014  . Dyslipidemia 11/03/2014  . Type 2 diabetes with nephropathy ([place]) 11/03/2014  . Essential (primary) hypertension 11/03/2014  . History of colon polyps 11/03/2014  . H/O malignant neoplasm of skin 11/03/2014  . Microalbuminuria 11/03/2014  . Obesity, morbid ([place]) 11/03/2014  . Osteopenia 11/03/2014  . Plantar fasciitis 11/03/2014  . Gastro-esophageal reflux disease without esophagitis 11/03/2014  . Bundle branch block, right 11/03/2014    Past Surgical History:  Procedure Laterality Date  . BREAST BIOPSY Left 2015   benign  . FOOT SURGERY Left 08/29/2011  Spur Removal , and Achilles Tendon Tendolysis  . VARICOSE VEIN SURGERY Bilateral     Family History  Problem Relation Age of Onset  . Cancer Mother        Colon and breast  . Anemia Mother   . Hypertension Mother   . Breast cancer Mother   . Cancer Father        Esophageal  . Heart disease Father   . Diabetes Brother   . Cancer Maternal Uncle        Colon  . Cancer Maternal Grandmother        Colon  . Healthy Brother   . Melanoma  Brother     Social History   Socioeconomic History  . Marital status: Married    Spouse name: Iona Beard  . Number of children: 2  . Years of education: Not on file  . Highest education level: 12th grade  Occupational History  . Occupation: Retired  Scientific laboratory technician  . Financial resource strain: Not hard at all  . Food insecurity:    Worry: Never true    Inability: Never true  . Transportation needs:    Medical: No    Non-medical: No  Tobacco Use  . Smoking status: Former Smoker    Packs/day: 0.25    Years: 2.00    Pack years: 0.50    Types: Cigarettes    Last attempt to quit: 02/10/1970    Years since quitting: 47.9  . Smokeless tobacco: Never Used  . Tobacco comment: smoking cessation materials not required  Substance and Sexual Activity  . Alcohol use: No    Alcohol/week: 0.0 standard drinks  . Drug use: No  . Sexual activity: Yes  Lifestyle  . Physical activity:    Days per week: 0 days    Minutes per session: 0 min  . Stress: Not at all  Relationships  . Social connections:    Talks on phone: Patient refused    Gets together: Patient refused    Attends religious service: Patient refused    Active member of club or organization: Patient refused    Attends meetings of clubs or organizations: Patient refused    Relationship status: Married  . Intimate partner violence:    Fear of current or ex partner: No    Emotionally abused: No    Physically abused: No    Forced sexual activity: No  Other Topics Concern  . Not on file  Social History Narrative  . Not on file     Current Outpatient Medications:  .  amLODipine (NORVASC) 10 MG tablet, Take 0.5 tablets (5 mg total) by mouth daily., Disp: 90 tablet, Rfl: 0 .  aspirin 81 MG tablet, Take 1 tablet by mouth daily., Disp: , Rfl:  .  atenolol-chlorthalidone (TENORETIC) 50-25 MG tablet, Take 1 tablet by mouth daily., Disp: 90 tablet, Rfl: 1 .  atorvastatin (LIPITOR) 40 MG tablet, Take 1 tablet (40 mg total) by mouth  daily., Disp: 90 tablet, Rfl: 1 .  benazepril (LOTENSIN) 40 MG tablet, Take 1 tablet (40 mg total) by mouth daily., Disp: 90 tablet, Rfl: 1 .  CALCIUM CARBONATE-VIT D-MIN PO, Take 2 tablets by mouth daily. 600 mg bid, Disp: , Rfl:  .  Cholecalciferol (VITAMIN D) 2000 UNITS CAPS, Take 1 tablet by mouth daily., Disp: , Rfl:  .  Coenzyme Q10 (COQ-10 PO), Take 300 tablets by mouth daily., Disp: , Rfl:  .  conjugated estrogens (PREMARIN) vaginal cream, Place 1 Applicatorful vaginally as  needed. , Disp: , Rfl:  .  Melatonin 5 MG TABS, Take 1 tablet by mouth at bedtime., Disp: , Rfl:  .  meloxicam (MOBIC) 15 MG tablet, Take 1 tablet (15 mg total) by mouth daily. (Patient taking differently: Take 15 mg by mouth as needed. ), Disp: 90 tablet, Rfl: 0 .  metFORMIN (GLUCOPHAGE) 1000 MG tablet, Take 1 tablet (1,000 mg total) by mouth 2 (two) times daily with a meal., Disp: 180 tablet, Rfl: 1 .  Omega-3 Fatty Acids (FISH OIL CONCENTRATE) 435 MG CAPS, Take 1 tablet by mouth daily., Disp: , Rfl:  .  omeprazole (PRILOSEC) 20 MG capsule, Take 1 capsule (20 mg total) by mouth daily., Disp: 90 capsule, Rfl: 4 .  Semaglutide (OZEMPIC) 0.25 or 0.5 MG/DOSE SOPN, Inject 0.5 mg into the skin once a week., Disp: 3 pen, Rfl: 1 .  triamcinolone (NASACORT AQ) 55 MCG/ACT AERO nasal inhaler, Place 2 sprays into the nose daily. (Patient taking differently: Place 2 sprays into the nose as needed. ), Disp: 1 Inhaler, Rfl: 0 .  Ospemifene (OSPHENA) 60 MG TABS, Take 60 mg by mouth daily., Disp: , Rfl:  .  pioglitazone (ACTOS) 15 MG tablet, Take 1 tablet (15 mg total) by mouth daily. (Patient not taking: Reported on 01/05/2018), Disp: 90 tablet, Rfl: 1 .  zolpidem (AMBIEN) 5 MG tablet, Take 1 tablet (5 mg total) by mouth every evening. (Patient not taking: Reported on 01/05/2018), Disp: 90 tablet, Rfl: 0  Allergies  Allergen Reactions  . Codeine   . Penicillins     I personally reviewed active problem list, medication list,  allergies, family history, social history with the patient/caregiver today.   ROS  Constitutional: Negative for fever , positive for mild  weight change.  Respiratory: Negative for cough and shortness of breath.   Cardiovascular: Negative for chest pain or palpitations.  Gastrointestinal: Negative for abdominal pain, no bowel changes.  Musculoskeletal: Positive for intermittent  gait problem and joint swelling.  Skin: Negative for rash.  Neurological: Negative for dizziness or headache.  No other specific complaints in a complete review of systems (except as listed in HPI above).  Objective  Vitals:   01/05/18 1006  BP: 126/66  Pulse: 84  Resp: 16  Temp: 98 F (36.7 C)  TempSrc: Oral  SpO2: 98%  Weight: 225 lb 11.2 oz (102.4 kg)  Height: _0  (1.778 m)    Body mass index is 32.38 kg/m.  Physical Exam  Constitutional: Patient appears well-developed and well-nourished. Obese  No distress.  HEENT: head atraumatic, normocephalic, pupils equal and reactive to light,  neck supple, throat within normal limits Cardiovascular: Normal rate, regular rhythm and normal heart sounds.  No murmur heard. No BLE edema. Pulmonary/Chest: Effort normal and breath sounds normal. No respiratory distress. Muscular Skeletal: no effusion, has crepitus  Abdominal: Soft.  There is no tenderness. Psychiatric: Patient has a normal mood and affect. behavior is normal. Judgment and thought content normal.  PHQ2/9: Depression screen [facility] 2/9 08/20/2017 03/18/2017 10/29/2016 02/12/2016 08/24/2015  Decreased Interest 0 0 0 0 0  Down, Depressed, Hopeless 0 0 0 0 0  PHQ - 2 Score 0 0 0 0 0  Altered sleeping 0 - - - -  Tired, decreased energy 0 - - - -  Change in appetite 0 - - - -  Feeling bad or failure about yourself  0 - - - -  Trouble concentrating 0 - - - -  Moving slowly or fidgety/restless 0 - - - -  Suicidal thoughts 0 - - - -  PHQ-9 Score 0 - - - -  Difficult doing work/chores Not difficult at  all - - - -     Fall Risk: Fall Risk  01/05/2018 08/20/2017 03/18/2017 10/29/2016 02/12/2016  Falls in the past year? 0 No No No No  Number falls in past yr: 0 - - - -  Risk for fall due to : - Impaired vision - - -  Risk for fall due to: Comment - wears eyeglasses - - -    Functional Status Survey: Is the patient deaf or have difficulty hearing?: No Does the patient have difficulty seeing, even when wearing glasses/contacts?: Yes Does the patient have difficulty concentrating, remembering, or making decisions?: No Does the patient have difficulty walking or climbing stairs?: No Does the patient have difficulty dressing or bathing?: No Does the patient have difficulty doing errands alone such as visiting a doctor's office or shopping?: No    Assessment & Plan  1. Type 2 diabetes with nephropathy (HCC)  - HgB A1c  2. Constipation, unspecified constipation type  Since started on Ozempic, had colonoscopy recently  - psyllium (METAMUCIL) 0.52 g capsule; Take 1 capsule (0.52 g total) by mouth daily.  Dispense: 90 capsule; Refill: 0  3. Dyslipidemia  On statin therapy   4. Insomnia, persistent  She is off Ambien and is doing well on melatonin prn   5. Essential (primary) hypertension  At goal on lower dose  - amLODipine (NORVASC) 5 MG tablet; Take 1 tablet (5 mg total) by mouth daily.  Dispense: 90 tablet; Refill: 0  6. Primary osteoarthritis involving multiple joints  She is taking tylenol

## 2018-01-05 NOTE — Patient Instructions (Signed)
Trulicity Bydureon  Ozempic

## 2018-01-06 LAB — HEMOGLOBIN A1C
EAG (MMOL/L): 9.2 (calc)
Hgb A1c MFr Bld: 7.4 % of total Hgb — ABNORMAL HIGH (ref <5.7)
MEAN PLASMA GLUCOSE: 166 (calc)

## 2018-01-27 ENCOUNTER — Other Ambulatory Visit: Payer: Self-pay | Admitting: Family Medicine

## 2018-01-27 DIAGNOSIS — IMO0002 Reserved for concepts with insufficient information to code with codable children: Secondary | ICD-10-CM

## 2018-01-27 DIAGNOSIS — E1121 Type 2 diabetes mellitus with diabetic nephropathy: Secondary | ICD-10-CM

## 2018-01-27 DIAGNOSIS — E1165 Type 2 diabetes mellitus with hyperglycemia: Principal | ICD-10-CM

## 2018-01-27 NOTE — Telephone Encounter (Signed)
Refill request for diabetic medication:   Actos 15 mg  Last office visit pertaining to diabetes: 01/05/2018   Lab Results  Component Value Date   HGBA1C 7.4 (H) 01/05/2018    Follow-ups on file. 05/17/2018

## 2018-03-05 ENCOUNTER — Other Ambulatory Visit: Payer: Self-pay | Admitting: Family Medicine

## 2018-03-05 DIAGNOSIS — E785 Hyperlipidemia, unspecified: Secondary | ICD-10-CM

## 2018-03-05 DIAGNOSIS — K219 Gastro-esophageal reflux disease without esophagitis: Secondary | ICD-10-CM

## 2018-03-05 DIAGNOSIS — E1121 Type 2 diabetes mellitus with diabetic nephropathy: Secondary | ICD-10-CM

## 2018-03-05 DIAGNOSIS — IMO0002 Reserved for concepts with insufficient information to code with codable children: Secondary | ICD-10-CM

## 2018-03-05 DIAGNOSIS — E1165 Type 2 diabetes mellitus with hyperglycemia: Principal | ICD-10-CM

## 2018-03-05 DIAGNOSIS — R809 Proteinuria, unspecified: Secondary | ICD-10-CM

## 2018-03-05 DIAGNOSIS — I10 Essential (primary) hypertension: Secondary | ICD-10-CM

## 2018-03-05 NOTE — Telephone Encounter (Signed)
Pt stated that when she say you in November that you where going to order her prescriptions but she asked you to hold off until the new year when she received her new insurance. She now has her new insurance and is asking for the following (90day) supply: Metformin 1000mg  Benazepril 40mg  Amlodipine 5mg  Omeprazole 20mg  Atenolol/chorthalidone 50/25 Pioglitazone 15mg  Atorvastatin 40mg  Ozempic 0.25 or 0.5mg   Please send to [facility] (information is on back of card)  Pt does have upcoming appt. Please call pt when done

## 2018-03-07 MED ORDER — OMEPRAZOLE 20 MG PO CPDR: 20.0000 mg | DELAYED_RELEASE_CAPSULE | Freq: Every day | ORAL | 4 refills | Status: DC

## 2018-03-07 MED ORDER — AMLODIPINE BESYLATE 5 MG PO TABS: 5.0000 mg | ORAL_TABLET | Freq: Every day | ORAL | 0 refills | Status: DC

## 2018-03-07 MED ORDER — BENAZEPRIL HCL 40 MG PO TABS: 40.0000 mg | ORAL_TABLET | Freq: Every day | ORAL | 0 refills | Status: DC

## 2018-03-07 MED ORDER — SEMAGLUTIDE(0.25 OR 0.5MG/DOS) 2 MG/1.5ML ~~LOC~~ SOPN: 0.5000 mg | PEN_INJECTOR | SUBCUTANEOUS | 0 refills | Status: DC

## 2018-03-07 MED ORDER — ATORVASTATIN CALCIUM 40 MG PO TABS: 40.0000 mg | ORAL_TABLET | Freq: Every day | ORAL | 0 refills | Status: DC

## 2018-03-07 MED ORDER — ATENOLOL-CHLORTHALIDONE 50-25 MG PO TABS
1.0000 | ORAL_TABLET | Freq: Every day | ORAL | 0 refills | Status: DC
Start: 2018-03-07 — End: 2018-08-25

## 2018-03-07 MED ORDER — METFORMIN HCL 1000 MG PO TABS: 1000.0000 mg | ORAL_TABLET | Freq: Two times a day (BID) | ORAL | 0 refills | Status: DC

## 2018-03-07 MED ORDER — PIOGLITAZONE HCL 15 MG PO TABS: 15.0000 mg | ORAL_TABLET | Freq: Every day | ORAL | 0 refills | Status: DC

## 2018-03-17 DIAGNOSIS — E113393 Type 2 diabetes mellitus with moderate nonproliferative diabetic retinopathy without macular edema, bilateral: Secondary | ICD-10-CM | POA: Diagnosis not present

## 2018-03-17 DIAGNOSIS — Z0100 Encounter for examination of eyes and vision without abnormal findings: Secondary | ICD-10-CM | POA: Diagnosis not present

## 2018-05-17 ENCOUNTER — Ambulatory Visit: Payer: Medicare PPO | Admitting: Family Medicine

## 2018-06-16 ENCOUNTER — Ambulatory Visit: Payer: Medicare HMO | Admitting: Family Medicine

## 2018-07-01 DIAGNOSIS — Z85840 Personal history of malignant neoplasm of eye: Secondary | ICD-10-CM | POA: Diagnosis not present

## 2018-07-01 DIAGNOSIS — L821 Other seborrheic keratosis: Secondary | ICD-10-CM | POA: Diagnosis not present

## 2018-07-01 DIAGNOSIS — D1801 Hemangioma of skin and subcutaneous tissue: Secondary | ICD-10-CM | POA: Diagnosis not present

## 2018-07-01 DIAGNOSIS — L814 Other melanin hyperpigmentation: Secondary | ICD-10-CM | POA: Diagnosis not present

## 2018-07-01 DIAGNOSIS — Z85828 Personal history of other malignant neoplasm of skin: Secondary | ICD-10-CM | POA: Diagnosis not present

## 2018-07-01 DIAGNOSIS — L570 Actinic keratosis: Secondary | ICD-10-CM | POA: Diagnosis not present

## 2018-07-01 DIAGNOSIS — L820 Inflamed seborrheic keratosis: Secondary | ICD-10-CM | POA: Diagnosis not present

## 2018-08-06 ENCOUNTER — Other Ambulatory Visit: Payer: Self-pay | Admitting: Family Medicine

## 2018-08-06 DIAGNOSIS — R809 Proteinuria, unspecified: Secondary | ICD-10-CM

## 2018-08-06 DIAGNOSIS — E1121 Type 2 diabetes mellitus with diabetic nephropathy: Secondary | ICD-10-CM

## 2018-08-06 DIAGNOSIS — IMO0002 Reserved for concepts with insufficient information to code with codable children: Secondary | ICD-10-CM

## 2018-08-06 DIAGNOSIS — E785 Hyperlipidemia, unspecified: Secondary | ICD-10-CM

## 2018-08-06 DIAGNOSIS — I10 Essential (primary) hypertension: Secondary | ICD-10-CM

## 2018-08-24 ENCOUNTER — Encounter: Payer: Self-pay | Admitting: Family Medicine

## 2018-08-24 ENCOUNTER — Ambulatory Visit (INDEPENDENT_AMBULATORY_CARE_PROVIDER_SITE_OTHER): Payer: Medicare HMO

## 2018-08-24 VITALS — Ht 70.0 in | Wt 221.0 lb

## 2018-08-24 DIAGNOSIS — Z1231 Encounter for screening mammogram for malignant neoplasm of breast: Secondary | ICD-10-CM | POA: Diagnosis not present

## 2018-08-24 DIAGNOSIS — Z0000 Encounter for general adult medical examination without abnormal findings: Secondary | ICD-10-CM

## 2018-08-24 LAB — HM DIABETES EYE EXAM

## 2018-08-24 NOTE — Progress Notes (Signed)
Subjective:   Michelle Montgomery is a 68 y.o. female who presents for Medicare Annual (Subsequent) preventive examination.  Virtual Visit via Telephone Note  I connected with Michelle Montgomery on 08/24/18 at  9:40 AM EDT by telephone and verified that I am speaking with the correct person using two identifiers.  Medicare Annual Wellness visit completed telephonically due to Covid-19 pandemic.   Location: Patient: home Provider: office   I discussed the limitations, risks, security and privacy concerns of performing an evaluation and management service by telephone and the availability of in person appointments. The patient expressed understanding and agreed to proceed.  Some vital signs may be absent or patient reported. Pt unable to take blood pressure at home today.   Clemetine Marker, LPN    Review of Systems:   Cardiac Risk Factors include: advanced age (>45men, >51 women);diabetes mellitus;hypertension;dyslipidemia;obesity (BMI >30kg/m2)     Objective:     Vitals: Ht 5\' 10"  (1.778 m)   Wt 221 lb (100.2 kg)   BMI 31.71 kg/m   Body mass index is 31.71 kg/m.  Advanced Directives 08/24/2018 08/20/2017 10/29/2016 06/12/2016 02/12/2016 08/24/2015 04/10/2015  Does Patient Have a Medical Advance Directive? Yes Yes Yes No No;Yes Yes Yes  Type of Advance Directive Living will [place];Living will - - Living will Living will Living will  Does patient want to make changes to medical advance directive? - - - - - No - Patient declined No - Patient declined  Copy of [place] in Chart? - No - copy requested - - - No - copy requested No - copy requested  Would patient like information on creating a medical advance directive? - - - - - - No - patient declined information    Tobacco Social History   Tobacco Use  Smoking Status Former Smoker  . Packs/day: 0.25  . Years: 2.00  . Pack years: 0.50  . Types: Cigarettes  . Quit date: 02/10/1970  . Years  since quitting: 48.5  Smokeless Tobacco Never Used  Tobacco Comment   smoking cessation materials not required     Counseling given: Not Answered Comment: smoking cessation materials not required   Clinical Intake:  Pre-visit preparation completed: Yes  Pain : No/denies pain     BMI - recorded: 31.71 Nutritional Status: BMI > 30  Obese Nutritional Risks: None Diabetes: Yes CBG done?: No Did pt. bring in CBG monitor from home?: No   Nutrition Risk Assessment:  Has the patient had any N/V/D within the last 2 months?  No  Does the patient have any non-healing wounds?  No  Has the patient had any unintentional weight loss or weight gain?  No   Diabetes:  Is the patient diabetic?  Yes  If diabetic, was a CBG obtained today?  No  Did the patient bring in their glucometer from home?  No  How often do you monitor your CBG's? Daily.   Financial Strains and Diabetes Management:  Are you having any financial strains with the device, your supplies or your medication? Yes . - ozempic, see nurse notes.  Does the patient want to be seen by Chronic Care Management for management of their diabetes?  No  Would the patient like to be referred to a Nutritionist or for Diabetic Management?  No   Diabetic Exams:  Diabetic Eye Exam: Completed 03/17/18 negative retinopathy.  Diabetic Foot Exam: Completed 03/18/17. Pt has been advised about the importance in completing this exam.  Pt is scheduled for diabetic foot exam on 08/25/18.   How often do you need to have someone help you when you read instructions, pamphlets, or other written materials from your doctor or pharmacy?: 1 - Never  Interpreter Needed?: No  Information entered by :: Clemetine Marker LPN  Past Medical History:  Diagnosis Date  . Arthrosis   . Bilateral leg cramps   . Bilateral sciatica   . Chronic insomnia   . Diabetes mellitus without complication ([place])   . Dyslipidemia   . History of colon polyps   . History of skin  cancer    Dr. Ave Filter, history of basal cell and squamous cells carcinoma  . Hypertension   . Microalbuminuria    100-DM  . Obesity   . Plantar fasciitis, left    Dr. Milinda Pointer  . Reflux   . Right bundle branch block    Past Surgical History:  Procedure Laterality Date  . BREAST BIOPSY Left 2015   benign  . FOOT SURGERY Left 08/29/2011   Spur Removal , and Achilles Tendon Tendolysis  . VARICOSE VEIN SURGERY Bilateral    Family History  Problem Relation Age of Onset  . Cancer Mother        Colon and breast  . Anemia Mother   . Hypertension Mother   . Breast cancer Mother   . Cancer Father        Esophageal  . Heart disease Father   . Diabetes Brother   . Cancer Maternal Uncle        Colon  . Cancer Maternal Grandmother        Colon  . Healthy Brother   . Melanoma Brother    Social History   Socioeconomic History  . Marital status: Married    Spouse name: Iona Beard  . Number of children: 2  . Years of education: Not on file  . Highest education level: 12th grade  Occupational History  . Occupation: Retired  Scientific laboratory technician  . Financial resource strain: Not hard at all  . Food insecurity    Worry: Never true    Inability: Never true  . Transportation needs    Medical: No    Non-medical: No  Tobacco Use  . Smoking status: Former Smoker    Packs/day: 0.25    Years: 2.00    Pack years: 0.50    Types: Cigarettes    Quit date: 02/10/1970    Years since quitting: 48.5  . Smokeless tobacco: Never Used  . Tobacco comment: smoking cessation materials not required  Substance and Sexual Activity  . Alcohol use: No    Alcohol/week: 0.0 standard drinks  . Drug use: No  . Sexual activity: Yes  Lifestyle  . Physical activity    Days per week: 0 days    Minutes per session: 0 min  . Stress: Not at all  Relationships  . Social connections    Talks on phone: More than three times a week    Gets together: More than three times a week    Attends religious service:  More than 4 times per year    Active member of club or organization: No    Attends meetings of clubs or organizations: Never    Relationship status: Married  Other Topics Concern  . Not on file  Social History Narrative  . Not on file    Outpatient Encounter Medications as of 08/24/2018  Medication Sig  . amLODipine (NORVASC) 5 MG tablet TAKE  1 TABLET DAILY  . aspirin 81 MG tablet Take 1 tablet by mouth daily.  Marland Kitchen atenolol-chlorthalidone (TENORETIC) 50-25 MG tablet Take 1 tablet by mouth daily.  Marland Kitchen atorvastatin (LIPITOR) 40 MG tablet TAKE 1 TABLET DAILY  . benazepril (LOTENSIN) 40 MG tablet TAKE 1 TABLET DAILY  . CALCIUM CARBONATE-VIT D-MIN PO Take 2 tablets by mouth daily. 600 mg bid  . Cholecalciferol (VITAMIN D) 2000 UNITS CAPS Take 1 tablet by mouth daily.  . Coenzyme Q10 (COQ-10 PO) Take 300 tablets by mouth daily.  Marland Kitchen conjugated estrogens (PREMARIN) vaginal cream Place 1 Applicatorful vaginally as needed.   . Melatonin 5 MG TABS Take 1 tablet by mouth at bedtime.  . meloxicam (MOBIC) 15 MG tablet Take 1 tablet (15 mg total) by mouth daily. (Patient taking differently: Take 15 mg by mouth as needed. )  . metFORMIN (GLUCOPHAGE) 1000 MG tablet TAKE 1 TABLET TWICE A DAY  WITH MEALS  . Omega-3 Fatty Acids (FISH OIL CONCENTRATE) 435 MG CAPS Take 1 tablet by mouth daily.  Marland Kitchen omeprazole (PRILOSEC) 20 MG capsule Take 1 capsule (20 mg total) by mouth daily.  . Ospemifene (OSPHENA) 60 MG TABS Take 60 mg by mouth daily.  . pioglitazone (ACTOS) 15 MG tablet TAKE 1 TABLET DAILY  . psyllium (METAMUCIL) 0.52 g capsule Take 1 capsule (0.52 g total) by mouth daily.  . Semaglutide,0.25 or 0.5MG /DOS, (OZEMPIC, 0.25 OR 0.5 MG/DOSE,) 2 MG/1.5ML SOPN Inject 0.5 mg into the skin once a week.  . triamcinolone (NASACORT AQ) 55 MCG/ACT AERO nasal inhaler Place 2 sprays into the nose daily. (Patient taking differently: Place 2 sprays into the nose as needed. )  . Turmeric 500 MG CAPS Take 2 capsules by mouth  daily.  Marland Kitchen UNABLE TO FIND Doterra Onguard and Doterra Copaiba capsules for immunity and joint pain   No facility-administered encounter medications on file as of 08/24/2018.     Activities of Daily Living In your present state of health, do you have any difficulty performing the following activities: 08/24/2018 01/05/2018  Hearing? N N  Comment declines hearing aids -  Vision? N Y  Difficulty concentrating or making decisions? N N  Walking or climbing stairs? N N  Dressing or bathing? N N  Doing errands, shopping? N N  Preparing Food and eating ? N -  Using the Toilet? N -  In the past six months, have you accidently leaked urine? N -  Do you have problems with loss of bowel control? N -  Managing your Medications? N -  Managing your Finances? N -  Housekeeping or managing your Housekeeping? N -  Some recent data might be hidden    Patient Care Team: Steele Sizer, MD as PCP - General (Family Medicine) Rubie Maid, MD as Consulting Physician (Obstetrics and Gynecology) Christene Slates, MD as Consulting Physician (Dermatology)    Assessment:   This is a routine wellness examination for Kaena.  Exercise Activities and Dietary recommendations Current Exercise Habits: The patient does not participate in regular exercise at present, Exercise limited by: None identified  Goals    . DIET - INCREASE WATER INTAKE     Recommend to drink at least 6-8 8oz glasses of water per day.    . Patient Stated     Pt states she would like to be able to get off of ozempic due to cost and maintain lower fasting blood sugars.        Fall Risk Fall Risk  08/24/2018 01/05/2018  08/20/2017 03/18/2017 10/29/2016  Falls in the past year? 0 0 No No No  Number falls in past yr: 0 0 - - -  Injury with Fall? 0 - - - -  Risk for fall due to : - - Impaired vision - -  Risk for fall due to: Comment - - wears eyeglasses - -  Follow up Falls prevention discussed - - - -   FALL RISK PREVENTION  PERTAINING TO THE HOME:  Any stairs in or around the home? Yes  If so, do they handrails? Yes   Home free of loose throw rugs in walkways, pet beds, electrical cords, etc? Yes  Adequate lighting in your home to reduce risk of falls? Yes   ASSISTIVE DEVICES UTILIZED TO PREVENT FALLS:  Life alert? No  Use of a cane, walker or w/c? No  Grab bars in the bathroom? No  Shower chair or bench in shower? Yes  Elevated toilet seat or a handicapped toilet? Yes   DME ORDERS:  DME order needed?  No   TIMED UP AND GO:  Was the test performed? No . Telephonic visit.   Education: Fall risk prevention has been discussed.  Intervention(s) required? No    Depression Screen PHQ 2/9 Scores 08/24/2018 08/20/2017 03/18/2017 10/29/2016  PHQ - 2 Score 0 0 0 0  PHQ- 9 Score 1 0 - -     Cognitive Function     6CIT Screen 08/24/2018 08/20/2017  What Year? 0 points 0 points  What month? 0 points 0 points  What time? 0 points 0 points  Count back from 20 0 points 0 points  Months in reverse 0 points 0 points  Repeat phrase 0 points 2 points  Total Score 0 2    Immunization History  Administered Date(s) Administered  . Influenza, High Dose Seasonal PF 11/06/2014, 10/29/2016  . Influenza,inj,Quad PF,6+ Mos 11/06/2014  . Influenza-Unspecified 11/09/2013, 12/13/2015, 12/08/2017  . Pneumococcal Conjugate-13 02/28/2014  . Pneumococcal Polysaccharide-23 02/21/2010, 04/10/2015  . Tdap 01/07/2010  . Zoster 10/27/2011  . Zoster Recombinat (Shingrix) 09/18/2017, 01/19/2018    Qualifies for Shingles Vaccine? Yes  Shingrix series completed.   Tdap: Up to date  Flu Vaccine: Up to date  Pneumococcal Vaccine: Up to date   Screening Tests Health Maintenance  Topic Date Due  . FOOT EXAM  03/18/2018  . HEMOGLOBIN A1C  07/06/2018  . INFLUENZA VACCINE  09/11/2018  . MAMMOGRAM  09/16/2018  . OPHTHALMOLOGY EXAM  03/18/2019  . TETANUS/TDAP  01/08/2020  . COLONOSCOPY  10/28/2022  . DEXA SCAN   Completed  . Hepatitis C Screening  Completed  . PNA vac Low Risk Adult  Completed    Cancer Screenings:  Colorectal Screening: Completed 10/27/17. Repeat every 5 years;   Mammogram: Completed 09/15/17. Repeat every year. Ordered today. Pt provided with contact information and advised to call to schedule appt.   Bone Density: Completed 09/15/17. Results reflect OSTEOPENIA. Repeat every 2 years. Ordered .   Lung Cancer Screening: (Low Dose CT Chest recommended if Age 34-80 years, 30 pack-year currently smoking OR have quit w/in 15years.) does not qualify.   Additional Screening:  Hepatitis C Screening: does qualify; Completed 10/27/11  Vision Screening: Recommended annual ophthalmology exams for early detection of glaucoma and other disorders of the eye. Is the patient up to date with their annual eye exam?  Yes  Who is the provider or what is the name of the office in which the pt attends annual eye exams? Dr.  Bell  Dental Screening: Recommended annual dental exams for proper oral hygiene  Community Resource Referral:  CRR required this visit?  No      Plan:    I have personally reviewed and addressed the Medicare Annual Wellness questionnaire and have noted the following in the patient's chart:  A. Medical and social history B. Use of alcohol, tobacco or illicit drugs  C. Current medications and supplements D. Functional ability and status E.  Nutritional status F.  Physical activity G. Advance directives H. List of other physicians I.  Hospitalizations, surgeries, and ER visits in previous 12 months J.  [place] such as hearing and vision if needed, cognitive and depression L. Referrals and appointments   In addition, I have reviewed and discussed with patient certain preventive protocols, quality metrics, and best practice recommendations. A written personalized care plan for preventive services as well as general preventive health recommendations were provided  to patient.   Signed,  Clemetine Marker, LPN Nurse Health Advisor   Nurse Notes: Pt doing well and appreciative of visit today. Contacted Dr. Gloriann Loan to request diabetic eye exam from 03/17/18. Pt also states that her ozempic is very expensive and she may have to switch to somethin else if unable to get samples or assistance. Pt declined my offer to refer to CCM at this time and plans to go online for resources from [facility]. Pt to follow up in office tomorrow with Dr. Ancil Boozer.

## 2018-08-24 NOTE — Patient Instructions (Addendum)
Michelle Montgomery , Thank you for taking time to come for your Medicare Wellness Visit. I appreciate your ongoing commitment to your health goals. Please review the following plan we discussed and let me know if I can assist you in the future.   Screening recommendations/referrals: Colonoscopy: done 10/27/17. Repeat in 2024. Mammogram: done 09/15/17. Please call [phone] to schedule your mammogram.  Bone Density: done 09/15/17 Recommended yearly ophthalmology/optometry visit for glaucoma screening and checkup Recommended yearly dental visit for hygiene and checkup  Vaccinations: Influenza vaccine: done 12/08/17 Pneumococcal vaccine: done 04/10/15 Tdap vaccine: done 01/07/10 Shingles vaccine: Shingrix series completed 01/19/18  Advanced directives: Please bring a copy of your health care power of attorney and living will to the office at your convenience.  Conditions/risks identified: recommend increasing physical activity to at least 3 days per week  Next appointment: Please follow up in one year for your Medicare Annual Wellness visit.     Preventive Care 47 Years and Older, Female Preventive care refers to lifestyle choices and visits with your health care provider that can promote health and wellness. What does preventive care include?  A yearly physical exam. This is also called an annual well check.  Dental exams once or twice a year.  Routine eye exams. Ask your health care provider how often you should have your eyes checked.  Personal lifestyle choices, including:  Daily care of your teeth and gums.  Regular physical activity.  Eating a healthy diet.  Avoiding tobacco and drug use.  Limiting alcohol use.  Practicing safe sex.  Taking low-dose aspirin every day.  Taking vitamin and mineral supplements as recommended by your health care provider. What happens during an annual well check? The services and screenings done by your health care provider during your annual  well check will depend on your age, overall health, lifestyle risk factors, and family history of disease. Counseling  Your health care provider may ask you questions about your:  Alcohol use.  Tobacco use.  Drug use.  Emotional well-being.  Home and relationship well-being.  Sexual activity.  Eating habits.  History of falls.  Memory and ability to understand (cognition).  Work and work Statistician.  Reproductive health. Screening  You may have the following tests or measurements:  Height, weight, and BMI.  Blood pressure.  Lipid and cholesterol levels. These may be checked every 5 years, or more frequently if you are over 62 years old.  Skin check.  Lung cancer screening. You may have this screening every year starting at age 26 if you have a 30-pack-year history of smoking and currently smoke or have quit within the past 15 years.  Fecal occult blood test (FOBT) of the stool. You may have this test every year starting at age 5.  Flexible sigmoidoscopy or colonoscopy. You may have a sigmoidoscopy every 5 years or a colonoscopy every 10 years starting at age 74.  Hepatitis C blood test.  Hepatitis B blood test.  Sexually transmitted disease (STD) testing.  Diabetes screening. This is done by checking your blood sugar (glucose) after you have not eaten for a while (fasting). You may have this done every 1-3 years.  Bone density scan. This is done to screen for osteoporosis. You may have this done starting at age 36.  Mammogram. This may be done every 1-2 years. Talk to your health care provider about how often you should have regular mammograms. Talk with your health care provider about your test results, treatment options, and if necessary,  the need for more tests. Vaccines  Your health care provider may recommend certain vaccines, such as:  Influenza vaccine. This is recommended every year.  Tetanus, diphtheria, and acellular pertussis (Tdap, Td) vaccine.  You may need a Td booster every 10 years.  Zoster vaccine. You may need this after age 33.  Pneumococcal 13-valent conjugate (PCV13) vaccine. One dose is recommended after age 27.  Pneumococcal polysaccharide (PPSV23) vaccine. One dose is recommended after age 82. Talk to your health care provider about which screenings and vaccines you need and how often you need them. This information is not intended to replace advice given to you by your health care provider. Make sure you discuss any questions you have with your health care provider. Document Released: 02/23/2015 Document Revised: 10/17/2015 Document Reviewed: 11/28/2014 Elsevier Interactive Patient Education  2017 [place] Prevention in the Home Falls can cause injuries. They can happen to people of all ages. There are many things you can do to make your home safe and to help prevent falls. What can I do on the outside of my home?  Regularly fix the edges of walkways and driveways and fix any cracks.  Remove anything that might make you trip as you walk through a door, such as a raised step or threshold.  Trim any bushes or trees on the path to your home.  Use bright outdoor lighting.  Clear any walking paths of anything that might make someone trip, such as rocks or tools.  Regularly check to see if handrails are loose or broken. Make sure that both sides of any steps have handrails.  Any raised decks and porches should have guardrails on the edges.  Have any leaves, snow, or ice cleared regularly.  Use sand or salt on walking paths during winter.  Clean up any spills in your garage right away. This includes oil or grease spills. What can I do in the bathroom?  Use night lights.  Install grab bars by the toilet and in the tub and shower. Do not use towel bars as grab bars.  Use non-skid mats or decals in the tub or shower.  If you need to sit down in the shower, use a plastic, non-slip stool.  Keep the  floor dry. Clean up any water that spills on the floor as soon as it happens.  Remove soap buildup in the tub or shower regularly.  Attach bath mats securely with double-sided non-slip rug tape.  Do not have throw rugs and other things on the floor that can make you trip. What can I do in the bedroom?  Use night lights.  Make sure that you have a light by your bed that is easy to reach.  Do not use any sheets or blankets that are too big for your bed. They should not hang down onto the floor.  Have a firm chair that has side arms. You can use this for support while you get dressed.  Do not have throw rugs and other things on the floor that can make you trip. What can I do in the kitchen?  Clean up any spills right away.  Avoid walking on wet floors.  Keep items that you use a lot in easy-to-reach places.  If you need to reach something above you, use a strong step stool that has a grab bar.  Keep electrical cords out of the way.  Do not use floor polish or wax that makes floors slippery. If you must use  wax, use non-skid floor wax.  Do not have throw rugs and other things on the floor that can make you trip. What can I do with my stairs?  Do not leave any items on the stairs.  Make sure that there are handrails on both sides of the stairs and use them. Fix handrails that are broken or loose. Make sure that handrails are as long as the stairways.  Check any carpeting to make sure that it is firmly attached to the stairs. Fix any carpet that is loose or worn.  Avoid having throw rugs at the top or bottom of the stairs. If you do have throw rugs, attach them to the floor with carpet tape.  Make sure that you have a light switch at the top of the stairs and the bottom of the stairs. If you do not have them, ask someone to add them for you. What else can I do to help prevent falls?  Wear shoes that:  Do not have high heels.  Have rubber bottoms.  Are comfortable and fit  you well.  Are closed at the toe. Do not wear sandals.  If you use a stepladder:  Make sure that it is fully opened. Do not climb a closed stepladder.  Make sure that both sides of the stepladder are locked into place.  Ask someone to hold it for you, if possible.  Clearly mark and make sure that you can see:  Any grab bars or handrails.  First and last steps.  Where the edge of each step is.  Use tools that help you move around (mobility aids) if they are needed. These include:  Canes.  Walkers.  Scooters.  Crutches.  Turn on the lights when you go into a dark area. Replace any light bulbs as soon as they burn out.  Set up your furniture so you have a clear path. Avoid moving your furniture around.  If any of your floors are uneven, fix them.  If there are any pets around you, be aware of where they are.  Review your medicines with your doctor. Some medicines can make you feel dizzy. This can increase your chance of falling. Ask your doctor what other things that you can do to help prevent falls. This information is not intended to replace advice given to you by your health care provider. Make sure you discuss any questions you have with your health care provider. Document Released: 11/23/2008 Document Revised: 07/05/2015 Document Reviewed: 03/03/2014 Elsevier Interactive Patient Education  2017 [employer].

## 2018-08-25 ENCOUNTER — Other Ambulatory Visit: Payer: Self-pay

## 2018-08-25 ENCOUNTER — Encounter: Payer: Self-pay | Admitting: Family Medicine

## 2018-08-25 ENCOUNTER — Ambulatory Visit (INDEPENDENT_AMBULATORY_CARE_PROVIDER_SITE_OTHER): Payer: Medicare HMO | Admitting: Family Medicine

## 2018-08-25 VITALS — BP 114/78 | HR 88 | Temp 97.8°F | Resp 16 | Ht 70.0 in | Wt 229.7 lb

## 2018-08-25 DIAGNOSIS — I10 Essential (primary) hypertension: Secondary | ICD-10-CM

## 2018-08-25 DIAGNOSIS — R809 Proteinuria, unspecified: Secondary | ICD-10-CM

## 2018-08-25 DIAGNOSIS — E1121 Type 2 diabetes mellitus with diabetic nephropathy: Secondary | ICD-10-CM

## 2018-08-25 DIAGNOSIS — M150 Primary generalized (osteo)arthritis: Secondary | ICD-10-CM

## 2018-08-25 DIAGNOSIS — M159 Polyosteoarthritis, unspecified: Secondary | ICD-10-CM

## 2018-08-25 DIAGNOSIS — E785 Hyperlipidemia, unspecified: Secondary | ICD-10-CM

## 2018-08-25 LAB — POCT GLYCOSYLATED HEMOGLOBIN (HGB A1C): Hemoglobin A1C: 7.2 % — AB (ref 4.0–5.6)

## 2018-08-25 MED ORDER — LIRAGLUTIDE 18 MG/3ML ~~LOC~~ SOPN: 1.8000 mg | PEN_INJECTOR | Freq: Every day | SUBCUTANEOUS | 1 refills | Status: DC

## 2018-08-25 MED ORDER — MELOXICAM 15 MG PO TABS: 15.0000 mg | ORAL_TABLET | ORAL | 0 refills | Status: DC | PRN

## 2018-08-25 MED ORDER — ATORVASTATIN CALCIUM 40 MG PO TABS: 40.0000 mg | ORAL_TABLET | Freq: Every day | ORAL | 0 refills | Status: DC

## 2018-08-25 MED ORDER — PIOGLITAZONE HCL 15 MG PO TABS
15.0000 mg | ORAL_TABLET | Freq: Every day | ORAL | 0 refills | Status: DC
Start: 2018-08-25 — End: 2018-12-22

## 2018-08-25 MED ORDER — AMLODIPINE BESYLATE 5 MG PO TABS: 5.0000 mg | ORAL_TABLET | Freq: Every day | ORAL | 0 refills | Status: DC

## 2018-08-25 MED ORDER — ATENOLOL-CHLORTHALIDONE 50-25 MG PO TABS: 1.0000 | ORAL_TABLET | Freq: Every day | ORAL | 1 refills | Status: DC

## 2018-08-25 MED ORDER — METFORMIN HCL 1000 MG PO TABS: 1000.0000 mg | ORAL_TABLET | Freq: Two times a day (BID) | ORAL | 0 refills | Status: DC

## 2018-08-25 MED ORDER — BENAZEPRIL HCL 40 MG PO TABS: 40.0000 mg | ORAL_TABLET | Freq: Every day | ORAL | 0 refills | Status: DC

## 2018-08-25 MED ORDER — INSULIN PEN NEEDLE 31G X 6 MM MISC: 1.0000 | Freq: Every day | 5 refills | Status: DC

## 2018-08-25 NOTE — Progress Notes (Signed)
Name: Michelle Montgomery   MRN: [id number]    DOB: 1950-09-16   Date:08/25/2018       Progress Note  Subjective  Chief Complaint  Chief Complaint  Patient presents with  . Diabetes  . Hypertension  . Obesity  . Gastroesophageal Reflux    HPI  DMII with nephropathy: hgbA1C was above 8% for about one year, she is offGlipizide but still taking Metformin but she has been forgetting to take evening dose, still on Actos, started on Ozempic 02/2019hgbA1Cwas8.2% and glucose normalized, however the cost of Ozempic is very high, we will try switching to Victoza today. She denies polyphagia, polydipsia but has polyuria. She states not as physically active - carrying for her 44 yo mother, but has been trying to eat well.   HTN: taking bp medication as prescribed, denies chest pain, palpitation, edema, dizzinessor SOB . We decreased dose of Norvasc to 5 mg, but she states she still had some left over of 10 mg . We will monitor, but explained if dizziness we can go down to 2.5 mg of Norvasc   Hyperlipidemia: taking Atorvastatin, she denies myalgia.She is compliant , we will recheck labs today   Insomnia: she is now off Ambien, taking melatonin and sleeps well most of the time. She has nocturia around 3 am and 6 am, occasionally taking Tylenol pm, discussed risk of tylenol pm Discussed Unysom  Lack of sense of smell and taste: symptoms started about about one year ago. She still can't smell dirty diapers, skunk smell or her dogs bad breath. She saw ENT and is on a different nasal steroid and states she is smelling a little better now. He suggested MRI brain but she decided to hold off She states she has noticed mild improvement lately. She states thought she met deductible but not yet. Discussed importance of getting it done  GERD: taking Omeprazole, and no heartburn or regurgitation noticed. Discussed long term use of PPI, dementia, cardiovascular disease, dementia and osteoporosis. She  tried weaning self off and switching to Zantac but symptoms returned, so she is back on PPI. Cannot even drink water without PPI. Unchanged   Obesity:she has multiple co-morbidities, including DM, HTN and OA. She has gained 5 lbs since last visit.   OA: she is doing very well on Meloxicam prn,takes for about 2 weeks and stops after that. No side effects.She is aware of risk of kidney disease   Osteopenia: FRAX 9.3% of major fracture and 1.2% of hip fracture, continue vitamin D also calcium, discussed high calcium diet   Patient Active Problem List   Diagnosis Date Noted  . Varicose veins of both lower extremities 06/12/2016  . BPV (benign positional vertigo), right 08/24/2015  . Smell disturbance 01/16/2015  . Osteoarthritis 11/03/2014  . Cramps of lower extremity 11/03/2014  . Neuralgia neuritis, sciatic nerve 11/03/2014  . Insomnia, persistent 11/03/2014  . Dyslipidemia 11/03/2014  . Type 2 diabetes with nephropathy ([place]) 11/03/2014  . Essential (primary) hypertension 11/03/2014  . History of colon polyps 11/03/2014  . H/O malignant neoplasm of skin 11/03/2014  . Microalbuminuria 11/03/2014  . Obesity, morbid ([place]) 11/03/2014  . Osteopenia 11/03/2014  . Plantar fasciitis 11/03/2014  . Gastro-esophageal reflux disease without esophagitis 11/03/2014  . Bundle branch block, right 11/03/2014    Past Surgical History:  Procedure Laterality Date  . BREAST BIOPSY Left 2015   benign  . FOOT SURGERY Left 08/29/2011   Spur Removal , and Achilles Tendon Tendolysis  . VARICOSE VEIN SURGERY  Bilateral     Family History  Problem Relation Age of Onset  . Cancer Mother        Colon and breast  . Anemia Mother   . Hypertension Mother   . Breast cancer Mother   . Cancer Father        Esophageal  . Heart disease Father   . Diabetes Brother   . Cancer Maternal Uncle        Colon  . Cancer Maternal Grandmother        Colon  . Healthy Brother   . Melanoma Brother      Social History   Socioeconomic History  . Marital status: Married    Spouse name: Iona Beard  . Number of children: 2  . Years of education: Not on file  . Highest education level: 12th grade  Occupational History  . Occupation: Retired  Scientific laboratory technician  . Financial resource strain: Not hard at all  . Food insecurity    Worry: Never true    Inability: Never true  . Transportation needs    Medical: No    Non-medical: No  Tobacco Use  . Smoking status: Former Smoker    Packs/day: 0.25    Years: 2.00    Pack years: 0.50    Types: Cigarettes    Quit date: 02/10/1970    Years since quitting: 48.5  . Smokeless tobacco: Never Used  . Tobacco comment: smoking cessation materials not required  Substance and Sexual Activity  . Alcohol use: No    Alcohol/week: 0.0 standard drinks  . Drug use: No  . Sexual activity: Yes  Lifestyle  . Physical activity    Days per week: 0 days    Minutes per session: 0 min  . Stress: Not at all  Relationships  . Social connections    Talks on phone: More than three times a week    Gets together: More than three times a week    Attends religious service: More than 4 times per year    Active member of club or organization: No    Attends meetings of clubs or organizations: Never    Relationship status: Married  . Intimate partner violence    Fear of current or ex partner: No    Emotionally abused: No    Physically abused: No    Forced sexual activity: No  Other Topics Concern  . Not on file  Social History Narrative  . Not on file     Current Outpatient Medications:  .  amLODipine (NORVASC) 5 MG tablet, Take 1 tablet (5 mg total) by mouth daily., Disp: 90 tablet, Rfl: 0 .  aspirin 81 MG tablet, Take 1 tablet by mouth daily., Disp: , Rfl:  .  atenolol-chlorthalidone (TENORETIC) 50-25 MG tablet, Take 1 tablet by mouth daily., Disp: 90 tablet, Rfl: 1 .  atorvastatin (LIPITOR) 40 MG tablet, Take 1 tablet (40 mg total) by mouth daily., Disp: 90  tablet, Rfl: 0 .  benazepril (LOTENSIN) 40 MG tablet, Take 1 tablet (40 mg total) by mouth daily., Disp: 90 tablet, Rfl: 0 .  CALCIUM CARBONATE-VIT D-MIN PO, Take 2 tablets by mouth daily. 600 mg bid, Disp: , Rfl:  .  Cholecalciferol (VITAMIN D) 2000 UNITS CAPS, Take 1 tablet by mouth daily., Disp: , Rfl:  .  Coenzyme Q10 (COQ-10 PO), Take 300 tablets by mouth daily., Disp: , Rfl:  .  conjugated estrogens (PREMARIN) vaginal cream, Place 1 Applicatorful vaginally as needed. , Disp: ,  Rfl:  .  Melatonin 5 MG TABS, Take 1 tablet by mouth at bedtime., Disp: , Rfl:  .  meloxicam (MOBIC) 15 MG tablet, Take 1 tablet (15 mg total) by mouth as needed., Disp: 90 tablet, Rfl: 0 .  metFORMIN (GLUCOPHAGE) 1000 MG tablet, Take 1 tablet (1,000 mg total) by mouth 2 (two) times daily with a meal., Disp: 180 tablet, Rfl: 0 .  Omega-3 Fatty Acids (FISH OIL CONCENTRATE) 435 MG CAPS, Take 1 tablet by mouth daily., Disp: , Rfl:  .  omeprazole (PRILOSEC) 20 MG capsule, Take 1 capsule (20 mg total) by mouth daily., Disp: 90 capsule, Rfl: 4 .  Ospemifene (OSPHENA) 60 MG TABS, Take 60 mg by mouth daily., Disp: , Rfl:  .  pioglitazone (ACTOS) 15 MG tablet, Take 1 tablet (15 mg total) by mouth daily., Disp: 90 tablet, Rfl: 0 .  psyllium (METAMUCIL) 0.52 g capsule, Take 1 capsule (0.52 g total) by mouth daily., Disp: 90 capsule, Rfl: 0 .  triamcinolone (NASACORT AQ) 55 MCG/ACT AERO nasal inhaler, Place 2 sprays into the nose daily. (Patient taking differently: Place 2 sprays into the nose as needed. ), Disp: 1 Inhaler, Rfl: 0 .  Turmeric 500 MG CAPS, Take 2 capsules by mouth daily., Disp: , Rfl:  .  UNABLE TO FIND, Doterra Onguard and Doterra Copaiba capsules for immunity and joint pain, Disp: , Rfl:  .  Insulin Pen Needle 31G X 6 MM MISC, 1 each by Does not apply route daily., Disp: 100 each, Rfl: 5 .  liraglutide (VICTOZA) 18 MG/3ML SOPN, Inject 0.3 mLs (1.8 mg total) into the skin daily., Disp: 27 mL, Rfl: 1  Allergies   Allergen Reactions  . Codeine   . Penicillins     I personally reviewed active problem list, medication list, allergies, family history, social history with the patient/caregiver today.   ROS  Constitutional: Negative for fever or weight change.  Respiratory: Negative for cough and shortness of breath.   Cardiovascular: Negative for chest pain or palpitations.  Gastrointestinal: Negative for abdominal pain, no bowel changes.  Musculoskeletal: Negative for gait problem or joint swelling.  Skin: Negative for rash.  Neurological: Negative for dizziness or headache.  No other specific complaints in a complete review of systems (except as listed in HPI above).  Objective  Vitals:   08/25/18 0740  BP: 114/78  Pulse: 88  Resp: 16  Temp: 97.8 F (36.6 C)  TempSrc: Temporal  SpO2: 97%  Weight: 229 lb 11.2 oz (104.2 kg)  Height: '5\' 10"'$  (1.778 m)    Body mass index is 32.96 kg/m.  Physical Exam  Constitutional: Patient appears well-developed and well-nourished. Obese  No distress.  HEENT: head atraumatic, normocephalic, pupils equal and reactive to light, neck supple Cardiovascular: Normal rate, regular rhythm and normal heart sounds.  No murmur heard. No BLE edema. Pulmonary/Chest: Effort normal and breath sounds normal. No respiratory distress. Abdominal: Soft.  There is no tenderness. Psychiatric: Patient has a normal mood and affect. behavior is normal. Judgment and thought content normal.   Recent Results (from the past 2160 hour(s))  HM DIABETES EYE EXAM     Status: None   Collection Time: 08/24/18 12:00 AM  Result Value Ref Range   HM Diabetic Eye Exam No Retinopathy No Retinopathy    Comment: [place], Dr. Izell Hinsdale, OD    Diabetic Foot Exam: Diabetic Foot Exam - Simple   Simple Foot Form Diabetic Foot exam was performed with the following findings:  Yes 08/25/2018  8:18 AM  Visual Inspection See comments: Yes Sensation Testing Intact to touch and  monofilament testing bilaterally: Yes Pulse Check Posterior Tibialis and Dorsalis pulse intact bilaterally: Yes Comments Thick toe nail       PHQ2/9: Depression screen [facility] 2/9 08/25/2018 08/24/2018 08/20/2017 03/18/2017 10/29/2016  Decreased Interest 0 0 0 0 0  Down, Depressed, Hopeless 0 0 0 0 0  PHQ - 2 Score 0 0 0 0 0  Altered sleeping 0 0 0 - -  Tired, decreased energy 0 1 0 - -  Change in appetite 0 0 0 - -  Feeling bad or failure about yourself  0 0 0 - -  Trouble concentrating 0 0 0 - -  Moving slowly or fidgety/restless 0 0 0 - -  Suicidal thoughts 0 0 0 - -  PHQ-9 Score 0 1 0 - -  Difficult doing work/chores - Not difficult at all Not difficult at all - -    phq 9 is negative  Fall Risk: Fall Risk  08/25/2018 08/24/2018 01/05/2018 08/20/2017 03/18/2017  Falls in the past year? 0 0 0 No No  Number falls in past yr: 0 0 0 - -  Injury with Fall? 0 0 - - -  Risk for fall due to : - - - Impaired vision -  Risk for fall due to: Comment - - - wears eyeglasses -  Follow up - Falls prevention discussed - - -    Assessment & Plan  1. Type 2 diabetes with nephropathy (HCC)  - POCT HgB A1C 7.2%  - metFORMIN (GLUCOPHAGE) 1000 MG tablet; Take 1 tablet (1,000 mg total) by mouth 2 (two) times daily with a meal.  Dispense: 180 tablet; Refill: 0 - pioglitazone (ACTOS) 15 MG tablet; Take 1 tablet (15 mg total) by mouth daily.  Dispense: 90 tablet; Refill: 0 - liraglutide (VICTOZA) 18 MG/3ML SOPN; Inject 0.3 mLs (1.8 mg total) into the skin daily.  Dispense: 27 mL; Refill: 1 - Insulin Pen Needle 31G X 6 MM MISC; 1 each by Does not apply route daily.  Dispense: 100 each; Refill: 5  2. Essential (primary) hypertension  - atenolol-chlorthalidone (TENORETIC) 50-25 MG tablet; Take 1 tablet by mouth daily.  Dispense: 90 tablet; Refill: 1 - amLODipine (NORVASC) 5 MG tablet; Take 1 tablet (5 mg total) by mouth daily.  Dispense: 90 tablet; Refill: 0 - benazepril (LOTENSIN) 40 MG tablet; Take 1  tablet (40 mg total) by mouth daily.  Dispense: 90 tablet; Refill: 0 - CBC with Differential/Platelet - COMPLETE METABOLIC PANEL WITH GFR  3. Primary osteoarthritis involving multiple joints  - meloxicam (MOBIC) 15 MG tablet; Take 1 tablet (15 mg total) by mouth as needed.  Dispense: 90 tablet; Refill: 0  4. Dyslipidemia  - atorvastatin (LIPITOR) 40 MG tablet; Take 1 tablet (40 mg total) by mouth daily.  Dispense: 90 tablet; Refill: 0 - Lipid panel  5. Microalbuminuria  - benazepril (LOTENSIN) 40 MG tablet; Take 1 tablet (40 mg total) by mouth daily.  Dispense: 90 tablet; Refill: 0  6. Morbid obesity, unspecified obesity type [facility])  Discussed with the patient the risk posed by an increased BMI. Discussed importance of portion control, calorie counting and at least 150 minutes of physical activity weekly. Avoid sweet beverages and drink more water. Eat at least 6 servings of fruit and vegetables daily

## 2018-08-26 DIAGNOSIS — E785 Hyperlipidemia, unspecified: Secondary | ICD-10-CM | POA: Diagnosis not present

## 2018-08-26 DIAGNOSIS — I10 Essential (primary) hypertension: Secondary | ICD-10-CM | POA: Diagnosis not present

## 2018-08-27 ENCOUNTER — Telehealth: Payer: Self-pay

## 2018-08-27 LAB — CBC WITH DIFFERENTIAL/PLATELET
Absolute Monocytes: 607 cells/uL (ref 200–950)
Basophils Absolute: 31 cells/uL (ref 0–200)
Basophils Relative: 0.6 %
Eosinophils Absolute: 184 cells/uL (ref 15–500)
Eosinophils Relative: 3.6 %
HCT: 39.2 % (ref 35.0–45.0)
Hemoglobin: 12.9 g/dL (ref 11.7–15.5)
Lymphs Abs: 1403 cells/uL (ref 850–3900)
MCH: 30.9 pg (ref 27.0–33.0)
MCHC: 32.9 g/dL (ref 32.0–36.0)
MCV: 94 fL (ref 80.0–100.0)
MPV: 8.4 fL (ref 7.5–12.5)
Monocytes Relative: 11.9 %
Neutro Abs: 2876 cells/uL (ref 1500–7800)
Neutrophils Relative %: 56.4 %
Platelets: 227 10*3/uL (ref 140–400)
RBC: 4.17 10*6/uL (ref 3.80–5.10)
RDW: 12.8 % (ref 11.0–15.0)
Total Lymphocyte: 27.5 %
WBC: 5.1 10*3/uL (ref 3.8–10.8)

## 2018-08-27 LAB — COMPLETE METABOLIC PANEL WITH GFR
AG Ratio: 1.9 (calc) (ref 1.0–2.5)
ALT: 16 U/L (ref 6–29)
AST: 14 U/L (ref 10–35)
Albumin: 4.6 g/dL (ref 3.6–5.1)
Alkaline phosphatase (APISO): 41 U/L (ref 37–153)
BUN: 23 mg/dL (ref 7–25)
CO2: 27 mmol/L (ref 20–32)
Calcium: 9.8 mg/dL (ref 8.6–10.4)
Chloride: 101 mmol/L (ref 98–110)
Creat: 0.75 mg/dL (ref 0.50–0.99)
GFR, Est African American: 94 mL/min/{1.73_m2} (ref >=60)
GFR, Est Non African American: 81 mL/min/{1.73_m2} (ref >=60)
Globulin: 2.4 g/dL (calc) (ref 1.9–3.7)
Glucose, Bld: 128 mg/dL — ABNORMAL HIGH (ref 65–99)
Potassium: 4.1 mmol/L (ref 3.5–5.3)
Sodium: 139 mmol/L (ref 135–146)
Total Bilirubin: 0.6 mg/dL (ref 0.2–1.2)
Total Protein: 7 g/dL (ref 6.1–8.1)

## 2018-08-27 LAB — LIPID PANEL
Cholesterol: 164 mg/dL (ref <200)
HDL: 37 mg/dL — ABNORMAL LOW (ref >=50)
LDL Cholesterol (Calc): 99 mg/dL (calc)
Non-HDL Cholesterol (Calc): 127 mg/dL (calc) (ref <130)
Total CHOL/HDL Ratio: 4.4 (calc) (ref <5.0)
Triglycerides: 184 mg/dL — ABNORMAL HIGH (ref <150)

## 2018-08-27 NOTE — Telephone Encounter (Signed)
Dr. Ancil Boozer received a letter from the patient regarding cost of medication Victoza [phone] for 3 months and Ozempic $[phone] for 3 months.   Dr. Ancil Boozer recommends patient to call her insurance and check on what is covered and the cost of Trulicity and Bydureon. Left a message for the patient notifying her to call us back with what insurance says on these other two medications.

## 2018-10-30 DIAGNOSIS — R69 Illness, unspecified: Secondary | ICD-10-CM | POA: Diagnosis not present

## 2018-12-05 IMAGING — MG MM DIGITAL SCREENING BILAT W/ TOMO W/ CAD
8 series · 8 of 24 positions shown · non-contrast
Comparison: Previous exam(s).

CLINICAL DATA: Screening.

EXAM:
DIGITAL SCREENING BILATERAL MAMMOGRAM WITH TOMO AND CAD

[R CC synth-2D]
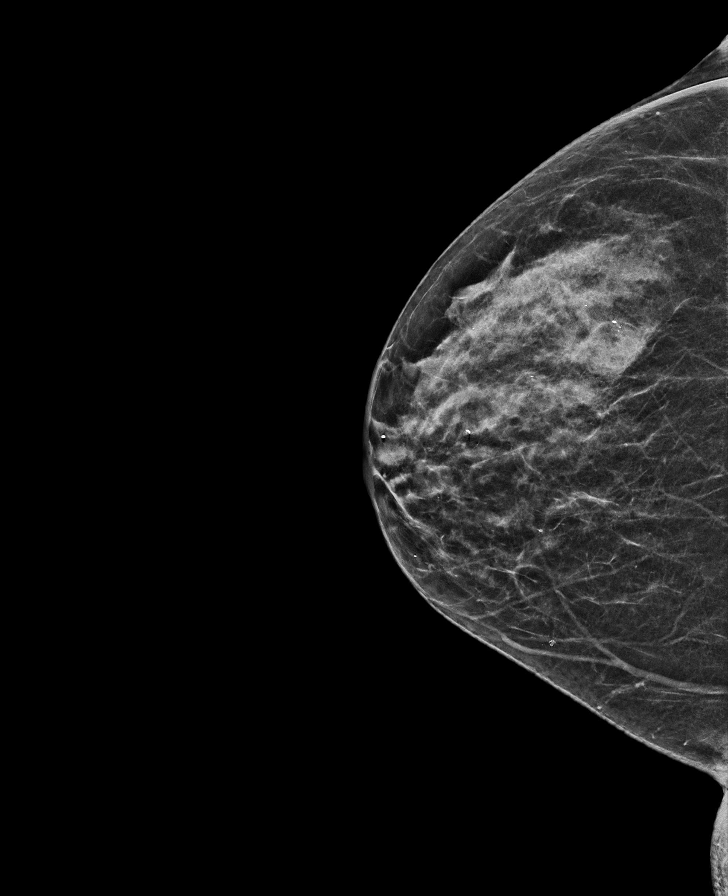

[R MLO synth-2D]
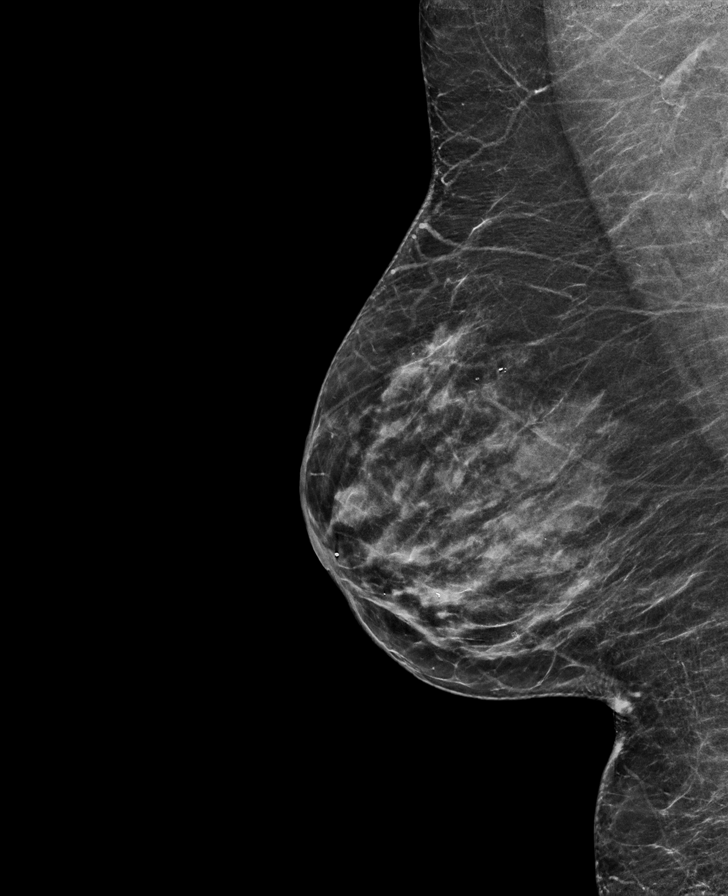

[L CC synth-2D]
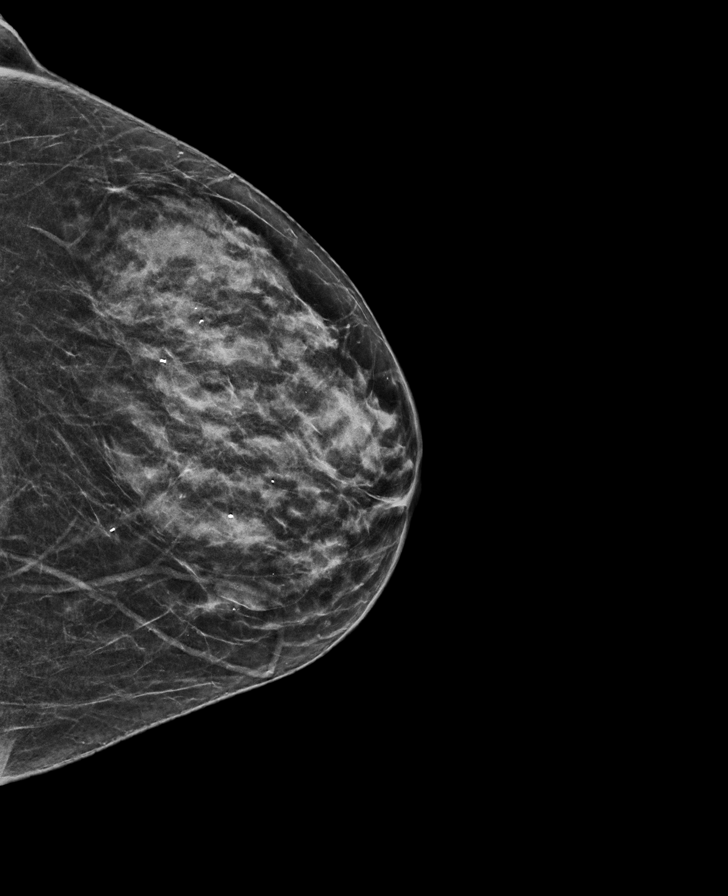

[L MLO synth-2D]
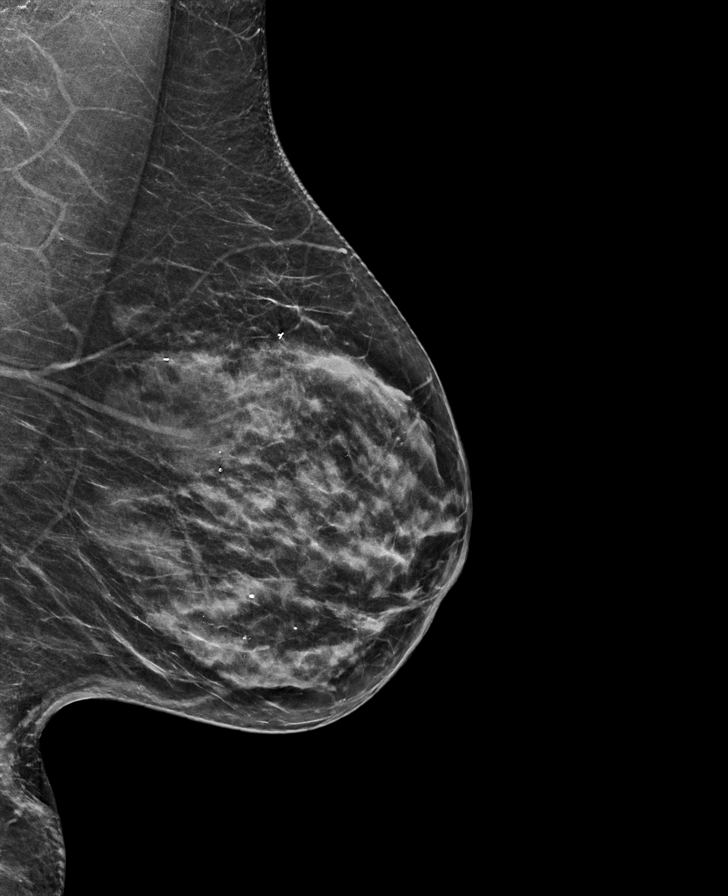

[L MLO tomo · tomo slice 27/54.0]
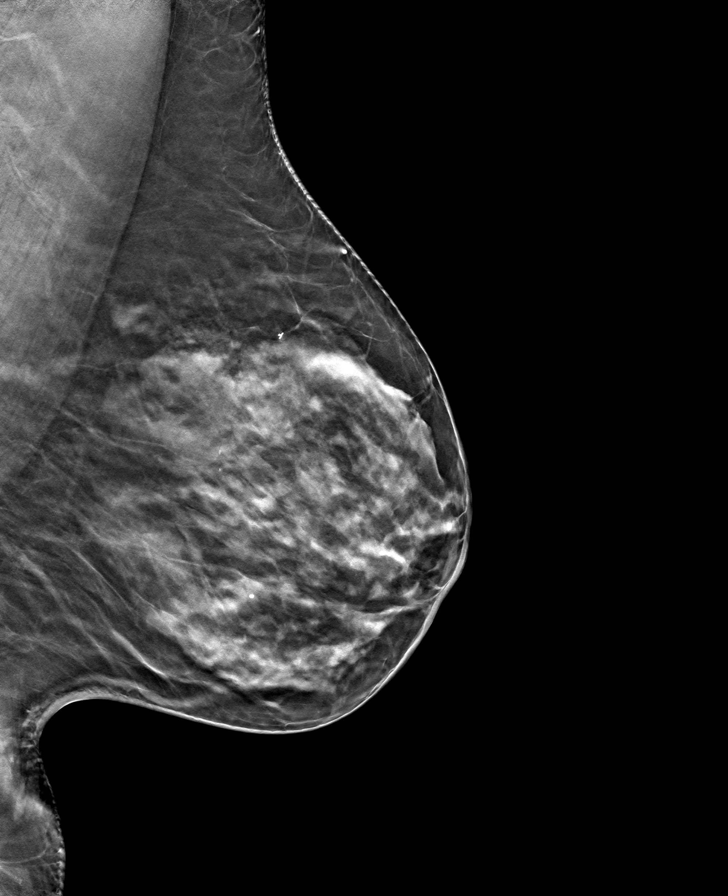

[L CC tomo · tomo slice 27/52.0]
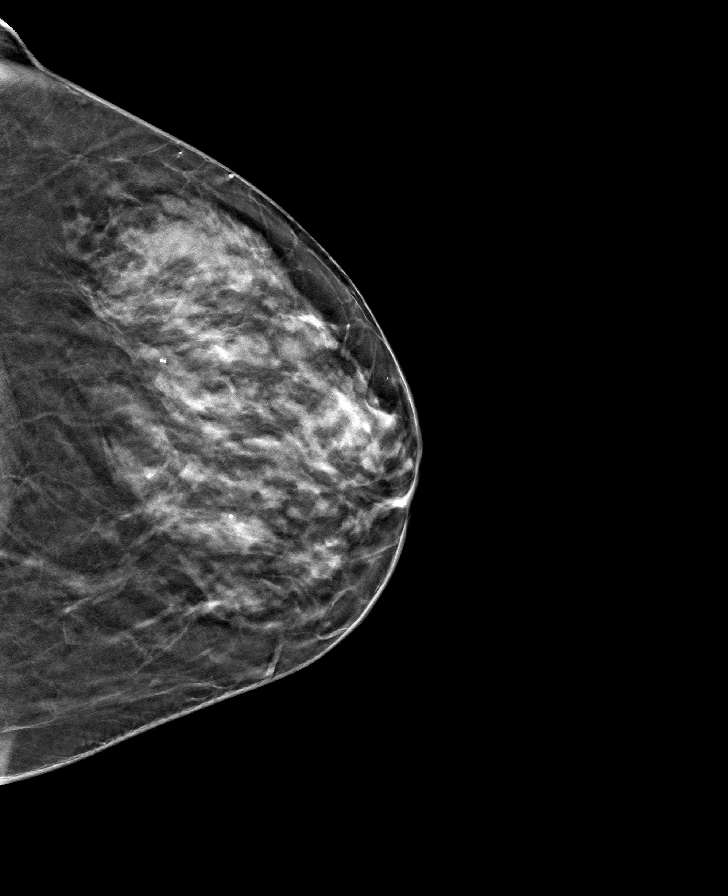

[R CC tomo · tomo slice 25/49.0]
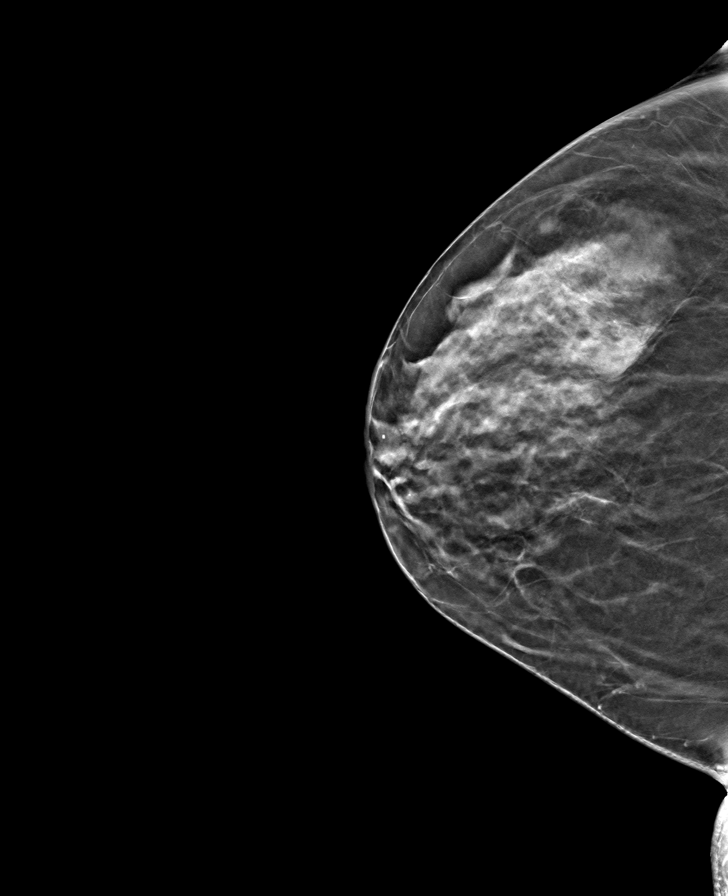

[R MLO tomo · tomo slice 29/58.0]
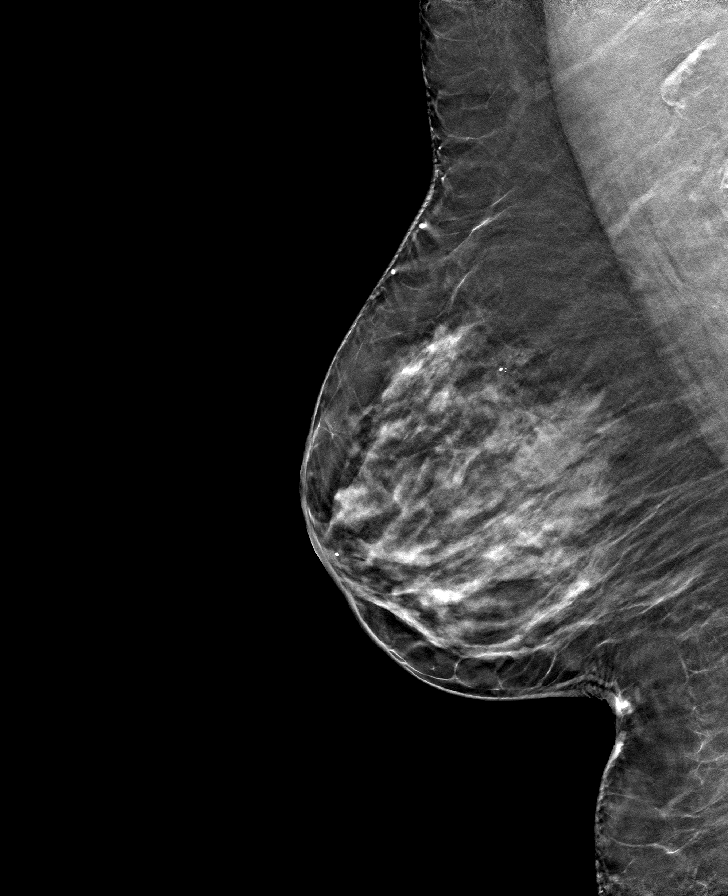

[8 of 24 positions shown; findings below may reference images not displayed]

ACR Breast Density Category c: The breast tissue is heterogeneously
dense, which may obscure small masses.
FINDINGS: There are no findings suspicious for malignancy. Images were
processed with CAD.
IMPRESSION: No mammographic evidence of malignancy. A result letter of this
screening mammogram will be mailed directly to the patient.

RECOMMENDATION:
Screening mammogram in one year. (Code:[id number]-[id number])

BI-RADS CATEGORY  1: Negative.

## 2018-12-22 ENCOUNTER — Other Ambulatory Visit: Payer: Self-pay | Admitting: Family Medicine

## 2018-12-22 DIAGNOSIS — E1121 Type 2 diabetes mellitus with diabetic nephropathy: Secondary | ICD-10-CM

## 2018-12-22 DIAGNOSIS — R809 Proteinuria, unspecified: Secondary | ICD-10-CM

## 2018-12-22 DIAGNOSIS — I10 Essential (primary) hypertension: Secondary | ICD-10-CM

## 2018-12-22 DIAGNOSIS — E785 Hyperlipidemia, unspecified: Secondary | ICD-10-CM

## 2018-12-29 ENCOUNTER — Ambulatory Visit (INDEPENDENT_AMBULATORY_CARE_PROVIDER_SITE_OTHER): Payer: Medicare HMO | Admitting: Family Medicine

## 2018-12-29 ENCOUNTER — Other Ambulatory Visit: Payer: Self-pay

## 2018-12-29 ENCOUNTER — Encounter: Payer: Self-pay | Admitting: Family Medicine

## 2018-12-29 VITALS — BP 120/76 | HR 69 | Temp 96.8°F | Resp 16 | Ht 70.0 in | Wt 230.6 lb

## 2018-12-29 DIAGNOSIS — E785 Hyperlipidemia, unspecified: Secondary | ICD-10-CM | POA: Diagnosis not present

## 2018-12-29 DIAGNOSIS — M159 Polyosteoarthritis, unspecified: Secondary | ICD-10-CM

## 2018-12-29 DIAGNOSIS — E1121 Type 2 diabetes mellitus with diabetic nephropathy: Secondary | ICD-10-CM | POA: Diagnosis not present

## 2018-12-29 DIAGNOSIS — M8580 Other specified disorders of bone density and structure, unspecified site: Secondary | ICD-10-CM

## 2018-12-29 DIAGNOSIS — K5900 Constipation, unspecified: Secondary | ICD-10-CM

## 2018-12-29 DIAGNOSIS — R809 Proteinuria, unspecified: Secondary | ICD-10-CM

## 2018-12-29 DIAGNOSIS — M8949 Other hypertrophic osteoarthropathy, multiple sites: Secondary | ICD-10-CM

## 2018-12-29 DIAGNOSIS — Z780 Asymptomatic menopausal state: Secondary | ICD-10-CM

## 2018-12-29 DIAGNOSIS — K219 Gastro-esophageal reflux disease without esophagitis: Secondary | ICD-10-CM | POA: Diagnosis not present

## 2018-12-29 DIAGNOSIS — G4700 Insomnia, unspecified: Secondary | ICD-10-CM | POA: Diagnosis not present

## 2018-12-29 DIAGNOSIS — I10 Essential (primary) hypertension: Secondary | ICD-10-CM | POA: Diagnosis not present

## 2018-12-29 MED ORDER — PIOGLITAZONE HCL 30 MG PO TABS: 30.0000 mg | ORAL_TABLET | Freq: Every day | ORAL | 1 refills | Status: DC

## 2018-12-29 NOTE — Progress Notes (Signed)
Name: Michelle Montgomery   MRN: [id number]    DOB: 1950/12/31   Date:01/03/2019       Progress Note  Subjective  Chief Complaint  Chief Complaint  Patient presents with  . Hypertension  . Gastroesophageal Reflux  . Diabetes  . Dyslipidemia    HPI  DMII with nephropathy: hgbA1Cwas above 8% for about one year,she is offGlipizidebut still takingMetformin 2000 mg daily still on Actos 15 mg , started on Ozempi 02/2019hgbA1Cwas8.2%and glucose normalized, however the cost of Ozempic was very high sp  we switched to Victoza but also was too expensive, today A1C is down to 7.8%. We will adjust dose of Actos to 30 mg and monitor, she will also try to follow a diabetic diet.  She denies polyphagia, polydipsiabut has polyuria.    HTN: taking bp medication as prescribed, denies chest pain, palpitation, edema, dizzinessor SOB bp is at goal.   Hyperlipidemia: taking Atorvastatin, she denies myalgia.She was not compliant to statin during her last labs, but she is doing better now.   Insomnia:she is now off Ambien, taking melatonin and sleeps well most of the time.She has nocturia around 3 am and 6 am, that is stable.   Lack of sense of smell and taste: symptoms started about about one year ago. She still can't smell dirty diapers, skunk smell or her dogs bad breath. She saw ENT and is on a different nasal steroid and states she is smelling a little better now. He suggested MRI brain but she decided to hold off She states she has noticed mild improvement lately. She states thought she met deductible but not yet.Discussed importance of getting it done Unchanged   GERD: taking Omeprazole, and no heartburn or regurgitation noticed. Discussed long term use of PPI, dementia, cardiovascular disease, dementia and osteoporosis. She tried weaning self off and switching to Zantac but symptoms returned, so she is back on PPI. Cannot even drink water without PPI, cannot skip not even one dose  without symptoms returning   Obesity:she has multiple co-morbidities, including DM, HTN and OA. She stopped Ozempic because of cost and side effects but weight is stable.   OA: she is doing very well on Meloxicam prn,takes for about 1 weeks and stops after that. No side effects.She is aware of risk of kidney disease   Osteopenia: FRAX 9.3% of major fracture and 1.2% of hip fracture, continue vitamin D also calcium, discussed high calcium diet . Unchanged  Patient Active Problem List   Diagnosis Date Noted  . Varicose veins of both lower extremities 06/12/2016  . BPV (benign positional vertigo), right 08/24/2015  . Smell disturbance 01/16/2015  . Osteoarthritis 11/03/2014  . Cramps of lower extremity 11/03/2014  . Neuralgia neuritis, sciatic nerve 11/03/2014  . Insomnia, persistent 11/03/2014  . Dyslipidemia 11/03/2014  . Type 2 diabetes with nephropathy ([place]) 11/03/2014  . Essential (primary) hypertension 11/03/2014  . History of colon polyps 11/03/2014  . H/O malignant neoplasm of skin 11/03/2014  . Microalbuminuria 11/03/2014  . Obesity, morbid ([place]) 11/03/2014  . Osteopenia 11/03/2014  . Plantar fasciitis 11/03/2014  . Gastro-esophageal reflux disease without esophagitis 11/03/2014  . Bundle branch block, right 11/03/2014    Past Surgical History:  Procedure Laterality Date  . BREAST BIOPSY Left 2015   benign  . FOOT SURGERY Left 08/29/2011   Spur Removal , and Achilles Tendon Tendolysis  . VARICOSE VEIN SURGERY Bilateral     Family History  Problem Relation Age of Onset  . Cancer Mother  Colon and breast  . Anemia Mother   . Hypertension Mother   . Breast cancer Mother   . Cancer Father        Esophageal  . Heart disease Father   . Diabetes Brother   . Cancer Maternal Uncle        Colon  . Cancer Maternal Grandmother        Colon  . Healthy Brother   . Melanoma Brother     Social History   Socioeconomic History  . Marital status: Married     Spouse name: Iona Beard  . Number of children: 2  . Years of education: Not on file  . Highest education level: 12th grade  Occupational History  . Occupation: Retired  Scientific laboratory technician  . Financial resource strain: Not hard at all  . Food insecurity    Worry: Never true    Inability: Never true  . Transportation needs    Medical: No    Non-medical: No  Tobacco Use  . Smoking status: Former Smoker    Packs/day: 0.25    Years: 2.00    Pack years: 0.50    Types: Cigarettes    Quit date: 02/10/1970    Years since quitting: 48.9  . Smokeless tobacco: Never Used  . Tobacco comment: smoking cessation materials not required  Substance and Sexual Activity  . Alcohol use: No    Alcohol/week: 0.0 standard drinks  . Drug use: No  . Sexual activity: Yes  Lifestyle  . Physical activity    Days per week: 0 days    Minutes per session: 0 min  . Stress: Not at all  Relationships  . Social connections    Talks on phone: More than three times a week    Gets together: More than three times a week    Attends religious service: More than 4 times per year    Active member of club or organization: No    Attends meetings of clubs or organizations: Never    Relationship status: Married  . Intimate partner violence    Fear of current or ex partner: No    Emotionally abused: No    Physically abused: No    Forced sexual activity: No  Other Topics Concern  . Not on file  Social History Narrative  . Not on file     Current Outpatient Medications:  .  amLODipine (NORVASC) 5 MG tablet, TAKE 1 TABLET DAILY, Disp: 90 tablet, Rfl: 0 .  aspirin 81 MG tablet, Take 1 tablet by mouth daily., Disp: , Rfl:  .  atorvastatin (LIPITOR) 40 MG tablet, TAKE 1 TABLET DAILY, Disp: 90 tablet, Rfl: 0 .  benazepril (LOTENSIN) 40 MG tablet, TAKE 1 TABLET DAILY, Disp: 90 tablet, Rfl: 0 .  CALCIUM CARBONATE-VIT D-MIN PO, Take 2 tablets by mouth daily. 600 mg bid, Disp: , Rfl:  .  Cholecalciferol (VITAMIN D) 2000  UNITS CAPS, Take 1 tablet by mouth daily., Disp: , Rfl:  .  Coenzyme Q10 (COQ-10 PO), Take 300 tablets by mouth daily., Disp: , Rfl:  .  conjugated estrogens (PREMARIN) vaginal cream, Place 1 Applicatorful vaginally as needed. , Disp: , Rfl:  .  Insulin Pen Needle 31G X 6 MM MISC, 1 each by Does not apply route daily., Disp: 100 each, Rfl: 5 .  Melatonin 5 MG TABS, Take 1 tablet by mouth at bedtime., Disp: , Rfl:  .  meloxicam (MOBIC) 15 MG tablet, Take 1 tablet (15 mg total) by mouth  as needed., Disp: 90 tablet, Rfl: 0 .  metFORMIN (GLUCOPHAGE) 1000 MG tablet, TAKE 1 TABLET TWICE DAILY  WITH MEALS, Disp: 180 tablet, Rfl: 0 .  Omega-3 Fatty Acids (FISH OIL CONCENTRATE) 435 MG CAPS, Take 1 tablet by mouth daily., Disp: , Rfl:  .  omeprazole (PRILOSEC) 20 MG capsule, Take 1 capsule (20 mg total) by mouth daily., Disp: 90 capsule, Rfl: 4 .  pioglitazone (ACTOS) 30 MG tablet, Take 1 tablet (30 mg total) by mouth daily., Disp: 90 tablet, Rfl: 1 .  psyllium (METAMUCIL) 0.52 g capsule, Take 1 capsule (0.52 g total) by mouth daily., Disp: 90 capsule, Rfl: 0 .  triamcinolone (NASACORT AQ) 55 MCG/ACT AERO nasal inhaler, Place 2 sprays into the nose daily. (Patient taking differently: Place 2 sprays into the nose as needed. ), Disp: 1 Inhaler, Rfl: 0 .  Turmeric 500 MG CAPS, Take 2 capsules by mouth daily., Disp: , Rfl:  .  UNABLE TO FIND, Doterra Onguard and Doterra Copaiba capsules for immunity and joint pain, Disp: , Rfl:  .  atenolol-chlorthalidone (TENORETIC) 50-25 MG tablet, TAKE ONE TABLET BY MOUTH DAILY, Disp: 90 tablet, Rfl: 0  Allergies  Allergen Reactions  . Codeine   . Penicillins     I personally reviewed active problem list, medication list, allergies, family history, social history, health maintenance with the patient/caregiver today.   ROS  Constitutional: Negative for fever or weight change.  Respiratory: Negative for cough and shortness of breath.   Cardiovascular: Negative for  chest pain or palpitations.  Gastrointestinal: Negative for abdominal pain, no bowel changes.  Musculoskeletal: Negative for gait problem or joint swelling.  Skin: Negative for rash.  Neurological: Negative for dizziness or headache.  No other specific complaints in a complete review of systems (except as listed in HPI above).  Objective  Vitals:   12/29/18 0753  BP: 120/76  Pulse: 69  Resp: 16  Temp: (!) 96.8 F (36 C)  TempSrc: Temporal  SpO2: 99%  Weight: 230 lb 9.6 oz (104.6 kg)  Height: 5' 10"  (1.778 m)    Body mass index is 33.09 kg/m.  Physical Exam  Constitutional: Patient appears well-developed and well-nourished. Obese  No distress.  HEENT: head atraumatic, normocephalic, pupils equal and reactive to light Cardiovascular: Normal rate, regular rhythm and normal heart sounds.  No murmur heard. No BLE edema. Pulmonary/Chest: Effort normal and breath sounds normal. No respiratory distress. Abdominal: Soft.  There is no tenderness. Muscular Skeletal: crepitus with extension of both knees  Psychiatric: Patient has a normal mood and affect. behavior is normal. Judgment and thought content normal.  PHQ2/9: Depression screen [facility] 2/9 12/29/2018 08/25/2018 08/24/2018 08/20/2017 03/18/2017  Decreased Interest 0 0 0 0 0  Down, Depressed, Hopeless 0 0 0 0 0  PHQ - 2 Score 0 0 0 0 0  Altered sleeping 0 0 0 0 -  Tired, decreased energy 0 0 1 0 -  Change in appetite 0 0 0 0 -  Feeling bad or failure about yourself  0 0 0 0 -  Trouble concentrating 0 0 0 0 -  Moving slowly or fidgety/restless 0 0 0 0 -  Suicidal thoughts 0 0 0 0 -  PHQ-9 Score 0 0 1 0 -  Difficult doing work/chores - - Not difficult at all Not difficult at all -    phq 9 is negative   Fall Risk: Fall Risk  12/29/2018 08/25/2018 08/24/2018 01/05/2018 08/20/2017  Falls in the past year? 0 0  0 0 No  Number falls in past yr: 0 0 0 0 -  Injury with Fall? 0 0 0 - -  Risk for fall due to : - - - - Impaired vision   Risk for fall due to: Comment - - - - wears eyeglasses  Follow up - - Falls prevention discussed - -     Functional Status Survey: Is the patient deaf or have difficulty hearing?: No Does the patient have difficulty seeing, even when wearing glasses/contacts?: No Does the patient have difficulty concentrating, remembering, or making decisions?: No Does the patient have difficulty walking or climbing stairs?: No Does the patient have difficulty dressing or bathing?: No Does the patient have difficulty doing errands alone such as visiting a doctor's office or shopping?: No   Assessment & Plan  1. Type 2 diabetes with nephropathy (HCC)  - POCT HgB A1C - pioglitazone (ACTOS) 30 MG tablet; Take 1 tablet (30 mg total) by mouth daily.  Dispense: 90 tablet; Refill: 1  2. Dyslipidemia  She is back on Atorvastatin   3. Essential (primary) hypertension  At goal   4. Insomnia, persistent  Doing well on prn melatonin   5. Gastro-esophageal reflux disease without esophagitis  On Omeprazole   6. Osteopenia after menopause  On high calcium and vitamin D supplements   7. Primary osteoarthritis involving multiple joints  On Meloxicam prn   8. Constipation, unspecified constipation type  Doing well since stopped Ozempic   9. Microalbuminuria  On ACE

## 2018-12-30 ENCOUNTER — Other Ambulatory Visit: Payer: Self-pay | Admitting: Family Medicine

## 2018-12-30 DIAGNOSIS — I10 Essential (primary) hypertension: Secondary | ICD-10-CM

## 2019-01-03 LAB — POCT GLYCOSYLATED HEMOGLOBIN (HGB A1C)
HbA1c POC (<> result, manual entry): 7.8 % (ref 4.0–5.6)
Hemoglobin A1C: 7.8 % — AB (ref 4.0–5.6)

## 2019-01-13 DIAGNOSIS — L570 Actinic keratosis: Secondary | ICD-10-CM | POA: Diagnosis not present

## 2019-01-13 DIAGNOSIS — L821 Other seborrheic keratosis: Secondary | ICD-10-CM | POA: Diagnosis not present

## 2019-01-13 DIAGNOSIS — L814 Other melanin hyperpigmentation: Secondary | ICD-10-CM | POA: Diagnosis not present

## 2019-01-13 DIAGNOSIS — L988 Other specified disorders of the skin and subcutaneous tissue: Secondary | ICD-10-CM | POA: Diagnosis not present

## 2019-03-15 ENCOUNTER — Other Ambulatory Visit: Payer: Self-pay | Admitting: Family Medicine

## 2019-03-15 DIAGNOSIS — I10 Essential (primary) hypertension: Secondary | ICD-10-CM

## 2019-03-15 DIAGNOSIS — E785 Hyperlipidemia, unspecified: Secondary | ICD-10-CM

## 2019-03-15 DIAGNOSIS — E1121 Type 2 diabetes mellitus with diabetic nephropathy: Secondary | ICD-10-CM

## 2019-03-15 NOTE — Telephone Encounter (Signed)
Refusing refill request due to recent dosage change.

## 2019-03-16 ENCOUNTER — Other Ambulatory Visit: Payer: Self-pay

## 2019-03-16 DIAGNOSIS — R809 Proteinuria, unspecified: Secondary | ICD-10-CM

## 2019-03-16 DIAGNOSIS — I10 Essential (primary) hypertension: Secondary | ICD-10-CM

## 2019-03-16 NOTE — Telephone Encounter (Signed)
Hypertension medication request: Lotensin 40 mg to [facility]   Last office visit pertaining to hypertension: 12/29/2018  BP Readings from Last 3 Encounters:  12/29/18 120/76  08/25/18 114/78  01/05/18 126/66    Lab Results  Component Value Date   CREATININE 0.75 08/26/2018   BUN 23 08/26/2018   NA 139 08/26/2018   K 4.1 08/26/2018   CL 101 08/26/2018   CO2 27 08/26/2018     Follow up on 04/28/2019

## 2019-03-17 MED ORDER — BENAZEPRIL HCL 40 MG PO TABS: 40.0000 mg | ORAL_TABLET | Freq: Every day | ORAL | 0 refills | Status: DC

## 2019-04-20 ENCOUNTER — Other Ambulatory Visit: Payer: Self-pay | Admitting: Family Medicine

## 2019-04-20 DIAGNOSIS — K219 Gastro-esophageal reflux disease without esophagitis: Secondary | ICD-10-CM

## 2019-04-20 NOTE — Telephone Encounter (Signed)
Requested medication (s) are due for refill today: Yes  Requested medication (s) are on the active medication list: Yes  Last refill:  03/07/18  Future visit scheduled: Yes  Notes to clinic:  Prescription has expired.    Requested Prescriptions  Pending Prescriptions Disp Refills   omeprazole (PRILOSEC) 20 MG capsule [Pharmacy Med Name: OMEPRAZOL RX CAP 20MG ] 90 capsule 3    Sig: TAKE 1 CAPSULE DAILY      Gastroenterology: Proton Pump Inhibitors Passed - 04/20/2019  8:14 AM      Passed - Valid encounter within last 12 months    Recent Outpatient Visits           3 months ago Type 2 diabetes with nephropathy [facility])   [facility] [place], Drue Stager, MD   7 months ago Type 2 diabetes with nephropathy [facility])   [facility] Steele Sizer, MD   1 year ago Type 2 diabetes with nephropathy [facility])   [facility] [place], Drue Stager, MD   1 year ago Uncontrolled type 2 diabetes mellitus with nephropathy [facility])   [facility] [place], Drue Stager, MD   2 years ago Uncontrolled type 2 diabetes mellitus with nephropathy [facility])   [facility] Steele Sizer, MD       Future Appointments             In 4 weeks Ancil Boozer, Drue Stager, MD [facility] [place]   In 4 months  [place]

## 2019-04-22 NOTE — Telephone Encounter (Signed)
Lvm to schedule follow up per Dr

## 2019-04-28 ENCOUNTER — Ambulatory Visit: Payer: Medicare HMO | Admitting: Family Medicine

## 2019-05-19 ENCOUNTER — Ambulatory Visit (INDEPENDENT_AMBULATORY_CARE_PROVIDER_SITE_OTHER): Payer: Medicare HMO | Admitting: Family Medicine

## 2019-05-19 ENCOUNTER — Encounter: Payer: Self-pay | Admitting: Family Medicine

## 2019-05-19 ENCOUNTER — Other Ambulatory Visit: Payer: Self-pay

## 2019-05-19 VITALS — BP 132/70 | HR 73 | Temp 97.1°F | Resp 16 | Ht 70.0 in | Wt 239.1 lb

## 2019-05-19 DIAGNOSIS — K219 Gastro-esophageal reflux disease without esophagitis: Secondary | ICD-10-CM | POA: Diagnosis not present

## 2019-05-19 DIAGNOSIS — I10 Essential (primary) hypertension: Secondary | ICD-10-CM

## 2019-05-19 DIAGNOSIS — M159 Polyosteoarthritis, unspecified: Secondary | ICD-10-CM

## 2019-05-19 DIAGNOSIS — E785 Hyperlipidemia, unspecified: Secondary | ICD-10-CM | POA: Diagnosis not present

## 2019-05-19 DIAGNOSIS — M8949 Other hypertrophic osteoarthropathy, multiple sites: Secondary | ICD-10-CM

## 2019-05-19 DIAGNOSIS — E1121 Type 2 diabetes mellitus with diabetic nephropathy: Secondary | ICD-10-CM | POA: Diagnosis not present

## 2019-05-19 DIAGNOSIS — R809 Proteinuria, unspecified: Secondary | ICD-10-CM

## 2019-05-19 LAB — POCT GLYCOSYLATED HEMOGLOBIN (HGB A1C): HbA1c, POC (controlled diabetic range): 9.3 % — AB (ref 0.0–7.0)

## 2019-05-19 MED ORDER — MELOXICAM 15 MG PO TABS: 15.0000 mg | ORAL_TABLET | ORAL | 0 refills | Status: DC | PRN

## 2019-05-19 MED ORDER — PIOGLITAZONE HCL 30 MG PO TABS: 30.0000 mg | ORAL_TABLET | Freq: Every day | ORAL | 1 refills | Status: DC

## 2019-05-19 MED ORDER — OMEPRAZOLE 20 MG PO CPDR: 20.0000 mg | DELAYED_RELEASE_CAPSULE | Freq: Every day | ORAL | 1 refills | Status: DC

## 2019-05-19 MED ORDER — GLIPIZIDE ER 5 MG PO TB24: 5.0000 mg | ORAL_TABLET | Freq: Every day | ORAL | 0 refills | Status: DC

## 2019-05-19 MED ORDER — METFORMIN HCL ER 750 MG PO TB24: 1500.0000 mg | ORAL_TABLET | Freq: Every day | ORAL | 1 refills | Status: DC

## 2019-05-19 MED ORDER — BENAZEPRIL HCL 40 MG PO TABS: 40.0000 mg | ORAL_TABLET | Freq: Every day | ORAL | 1 refills | Status: DC

## 2019-05-19 MED ORDER — ATENOLOL-CHLORTHALIDONE 50-25 MG PO TABS: 1.0000 | ORAL_TABLET | Freq: Every day | ORAL | 1 refills | Status: DC

## 2019-05-19 MED ORDER — ATORVASTATIN CALCIUM 40 MG PO TABS: 40.0000 mg | ORAL_TABLET | Freq: Every day | ORAL | 1 refills | Status: DC

## 2019-05-19 MED ORDER — AMLODIPINE BESYLATE 5 MG PO TABS: 5.0000 mg | ORAL_TABLET | Freq: Every day | ORAL | 1 refills | Status: DC

## 2019-05-19 NOTE — Progress Notes (Signed)
Name: Michelle Montgomery   MRN: [id number]    DOB: [birth year]   Date:05/19/2019       Progress Note  Subjective  Chief Complaint  Chief Complaint  Patient presents with  . Medication Refill  . Diabetes    Checks every morning-Highest-195  . Hypertension    Denies any symptoms  . Gastroesophageal Reflux  . Hyperlipidemia  . Insomnia  . Osteopenia    HPI  DMII with nephropathy: hgbA1Cwas above 8% for about one year. She was off Glipizide , but was  takingMetformin2000 mg ,Actos 15 mg , started on Ozempi 02/2019hgbA1Cwas8.2%and glucose normalized, however the cost of Ozempic was very high so we switched to Victoza but also was too expensive, last visit her  A1C was  down to 7.8%, however even though we increased Actos to 30 mg back in November her diet has been poor and without the GLP-1 A1C is now up to 9.3%. Explained that we can try Glipizide again, but since A1C is above 9% she will likely need low dose insulin if glucose not improving in 3 months . She states she is going to resume the [place] again, she states she has improved her glucose in the past with this diet. She also told me that she forget to take pm dose of Metformin , we will change to Metformin ER to increase compliance and we will resume Glipizide. She states glucose at home has been 190's fasting   HTN: taking bp medication as prescribed, denies chest pain, palpitation, edema, dizzinessor decrease in exercise tolerance. BP is at goal today   Hyperlipidemia: taking Atorvastatin, she denies myalgia.She has been compliant with medication lately  Insomnia:she is now off Ambien, taking melatonin and sleeps well most of the time.She has nocturia around 3 am and 6 am, that is stable.  Unchanged   Lack of sense of smell and taste: symptoms started about about one year ago. She still can't smell dirty diapers, skunk smell or her dogs bad breath. She saw ENT and is on a different nasal steroid and states  she is smelling a little better now. He suggested MRI brain but she decided to hold off She states she has noticed mild improvement lately. Unchanged   GERD: taking Omeprazole, and no heartburn or regurgitation noticed. Discussed long term use of PPI, dementia, cardiovascular disease, dementia and osteoporosis.She has recurrence of symptoms if she skips medication for two days. She has some pill dysphagia, but no problems with food   Obesity:she has multiple co-morbidities,including DM, HTN and OA. She stopped Ozempic because of cost, and has been eating out more often and has gained weight.   OA: she is doing very well on Meloxicam prn, she states her knees are grinding more, difficulty walking on uneven surfaces, she takes medication twice a month for about 5 days days when she takes it   Osteopenia: FRAX 9.3% of major fracture and 1.2% of hip fracture, continue vitamin Dalso calcium, discussed high calcium diet.Unchanged   Patient Active Problem List   Diagnosis Date Noted  . Varicose veins of both lower extremities 06/12/2016  . BPV (benign positional vertigo), right 08/24/2015  . Smell disturbance 01/16/2015  . Osteoarthritis 11/03/2014  . Cramps of lower extremity 11/03/2014  . Neuralgia neuritis, sciatic nerve 11/03/2014  . Insomnia, persistent 11/03/2014  . Dyslipidemia 11/03/2014  . Type 2 diabetes with nephropathy ([place]) 11/03/2014  . Essential (primary) hypertension 11/03/2014  . History of colon polyps 11/03/2014  . H/O malignant  neoplasm of skin 11/03/2014  . Microalbuminuria 11/03/2014  . Obesity, morbid ([place]) 11/03/2014  . Osteopenia 11/03/2014  . Plantar fasciitis 11/03/2014  . Gastro-esophageal reflux disease without esophagitis 11/03/2014  . Bundle branch block, right 11/03/2014    Past Surgical History:  Procedure Laterality Date  . BREAST BIOPSY Left 2015   benign  . FOOT SURGERY Left 08/29/2011   Spur Removal , and Achilles Tendon Tendolysis  .  VARICOSE VEIN SURGERY Bilateral     Family History  Problem Relation Age of Onset  . Cancer Mother        Colon and breast  . Anemia Mother   . Hypertension Mother   . Breast cancer Mother   . Cancer Father        Esophageal  . Heart disease Father   . Diabetes Brother   . Cancer Maternal Uncle        Colon  . Cancer Maternal Grandmother        Colon  . Healthy Brother   . Melanoma Brother     Social History   Tobacco Use  . Smoking status: Former Smoker    Packs/day: 0.25    Years: 2.00    Pack years: 0.50    Types: Cigarettes    Quit date: 02/10/1970    Years since quitting: 49.3  . Smokeless tobacco: Never Used  . Tobacco comment: smoking cessation materials not required  Substance Use Topics  . Alcohol use: No    Alcohol/week: 0.0 standard drinks     Current Outpatient Medications:  .  amLODipine (NORVASC) 5 MG tablet, TAKE 1 TABLET DAILY, Disp: 90 tablet, Rfl: 0 .  aspirin 81 MG tablet, Take 1 tablet by mouth daily., Disp: , Rfl:  .  atenolol-chlorthalidone (TENORETIC) 50-25 MG tablet, TAKE ONE TABLET BY MOUTH DAILY, Disp: 90 tablet, Rfl: 0 .  atorvastatin (LIPITOR) 40 MG tablet, TAKE 1 TABLET DAILY, Disp: 90 tablet, Rfl: 0 .  benazepril (LOTENSIN) 40 MG tablet, Take 1 tablet (40 mg total) by mouth daily., Disp: 90 tablet, Rfl: 0 .  CALCIUM CARBONATE-VIT D-MIN PO, Take 2 tablets by mouth daily. 600 mg bid, Disp: , Rfl:  .  Cholecalciferol (VITAMIN D) 2000 UNITS CAPS, Take 1 tablet by mouth daily., Disp: , Rfl:  .  Coenzyme Q10 (COQ-10 PO), Take 300 tablets by mouth daily., Disp: , Rfl:  .  conjugated estrogens (PREMARIN) vaginal cream, Place 1 Applicatorful vaginally as needed. , Disp: , Rfl:  .  Melatonin 5 MG TABS, Take 1 tablet by mouth at bedtime., Disp: , Rfl:  .  meloxicam (MOBIC) 15 MG tablet, Take 1 tablet (15 mg total) by mouth as needed., Disp: 90 tablet, Rfl: 0 .  metFORMIN (GLUCOPHAGE) 1000 MG tablet, TAKE 1 TABLET TWICE DAILY  WITH MEALS, Disp: 180  tablet, Rfl: 0 .  Omega-3 Fatty Acids (FISH OIL CONCENTRATE) 435 MG CAPS, Take 1 tablet by mouth daily., Disp: , Rfl:  .  omeprazole (PRILOSEC) 20 MG capsule, TAKE 1 CAPSULE DAILY, Disp: 90 capsule, Rfl: 0 .  pioglitazone (ACTOS) 30 MG tablet, Take 1 tablet (30 mg total) by mouth daily., Disp: 90 tablet, Rfl: 1 .  psyllium (METAMUCIL) 0.52 g capsule, Take 1 capsule (0.52 g total) by mouth daily., Disp: 90 capsule, Rfl: 0 .  triamcinolone (NASACORT AQ) 55 MCG/ACT AERO nasal inhaler, Place 2 sprays into the nose daily. (Patient taking differently: Place 2 sprays into the nose as needed. ), Disp: 1 Inhaler, Rfl: 0 .  Turmeric 500 MG CAPS, Take 2 capsules by mouth daily., Disp: , Rfl:  .  UNABLE TO FIND, Doterra Onguard and Doterra Copaiba capsules for immunity and joint pain, Disp: , Rfl:  .  Insulin Pen Needle 31G X 6 MM MISC, 1 each by Does not apply route daily. (Patient not taking: Reported on 05/19/2019), Disp: 100 each, Rfl: 5  Allergies  Allergen Reactions  . Codeine   . Penicillins     I personally reviewed active problem list, medication list, allergies, family history, social history, health maintenance with the patient/caregiver today.   ROS  Constitutional: Negative for fever, positive for weight change.  Respiratory: Negative for cough and shortness of breath.   Cardiovascular: Negative for chest pain or palpitations.  Gastrointestinal: Negative for abdominal pain, no bowel changes.  Musculoskeletal: positive  for gait problem , she also has intermittent knee  joint swelling.  Skin: Negative for rash.  Neurological: Negative for dizziness or headache.  No other specific complaints in a complete review of systems (except as listed in HPI above).  Objective  Vitals:   05/19/19 0944  BP: 132/70  Pulse: 73  Resp: 16  Temp: (!) 97.1 F (36.2 C)  TempSrc: Temporal  SpO2: 94%  Weight: 239 lb 1.6 oz (108.5 kg)  Height: 5\' 10"  (1.778 m)    Body mass index is 34.31  kg/m.  Physical Exam  Constitutional: Patient appears well-developed and well-nourished. Obese No distress.  HEENT: head atraumatic, normocephalic, pupils equal and reactive to light Cardiovascular: Normal rate, regular rhythm and normal heart sounds.  No murmur heard. No BLE edema. Pulmonary/Chest: Effort normal and breath sounds normal. No respiratory distress. Abdominal: Soft.  There is no tenderness. Psychiatric: Patient has a normal mood and affect. behavior is normal. Judgment and thought content normal.  Recent Results (from the past 2160 hour(s))  POCT HgB A1C     Status: Abnormal   Collection Time: 05/19/19  9:53 AM  Result Value Ref Range   Hemoglobin A1C     HbA1c POC (<> result, manual entry)     HbA1c, POC (prediabetic range)     HbA1c, POC (controlled diabetic range) 9.3 (A) 0.0 - 7.0 %      PHQ2/9: Depression screen [facility] 2/9 05/19/2019 12/29/2018 08/25/2018 08/24/2018 08/20/2017  Decreased Interest 1 0 0 0 0  Down, Depressed, Hopeless 0 0 0 0 0  PHQ - 2 Score 1 0 0 0 0  Altered sleeping 1 0 0 0 0  Tired, decreased energy 1 0 0 1 0  Change in appetite 2 0 0 0 0  Feeling bad or failure about yourself  0 0 0 0 0  Trouble concentrating 0 0 0 0 0  Moving slowly or fidgety/restless 0 0 0 0 0  Suicidal thoughts 0 0 0 0 0  PHQ-9 Score 5 0 0 1 0  Difficult doing work/chores Not difficult at all - - Not difficult at all Not difficult at all    phq 9 is negative, she states not depressed but has been tired taking care of her mother that is 53 yo    Fall Risk: Fall Risk  05/19/2019 12/29/2018 08/25/2018 08/24/2018 01/05/2018  Falls in the past year? 0 0 0 0 0  Number falls in past yr: 0 0 0 0 0  Injury with Fall? 0 0 0 0 -  Risk for fall due to : - - - - -  Risk for fall due to: Comment - - - - -  Follow up - - - Falls prevention discussed -     Functional Status Survey: Is the patient deaf or have difficulty hearing?: No Does the patient have difficulty seeing, even  when wearing glasses/contacts?: Yes Does the patient have difficulty concentrating, remembering, or making decisions?: No Does the patient have difficulty walking or climbing stairs?: No Does the patient have difficulty dressing or bathing?: No Does the patient have difficulty doing errands alone such as visiting a doctor's office or shopping?: No    Assessment & Plan  1. Type 2 diabetes with nephropathy (HCC)  - POCT HgB A1C - Urine Microalbumin w/creat. ratio - pioglitazone (ACTOS) 30 MG tablet; Take 1 tablet (30 mg total) by mouth daily.  Dispense: 90 tablet; Refill: 1 - metFORMIN (GLUCOPHAGE-XR) 750 MG 24 hr tablet; Take 2 tablets (1,500 mg total) by mouth daily with breakfast.  Dispense: 180 tablet; Refill: 1 - glipiZIDE (GLUCOTROL XL) 5 MG 24 hr tablet; Take 1 tablet (5 mg total) by mouth daily with breakfast.  Dispense: 90 tablet; Refill: 0  2. Microalbuminuria  - benazepril (LOTENSIN) 40 MG tablet; Take 1 tablet (40 mg total) by mouth daily.  Dispense: 90 tablet; Refill: 1  3. Essential (primary) hypertension  - benazepril (LOTENSIN) 40 MG tablet; Take 1 tablet (40 mg total) by mouth daily.  Dispense: 90 tablet; Refill: 1 - atenolol-chlorthalidone (TENORETIC) 50-25 MG tablet; Take 1 tablet by mouth daily.  Dispense: 90 tablet; Refill: 1 - amLODipine (NORVASC) 5 MG tablet; Take 1 tablet (5 mg total) by mouth daily.  Dispense: 90 tablet; Refill: 1  4. Dyslipidemia  - atorvastatin (LIPITOR) 40 MG tablet; Take 1 tablet (40 mg total) by mouth daily.  Dispense: 90 tablet; Refill: 1  5. Primary osteoarthritis involving multiple joints  - meloxicam (MOBIC) 15 MG tablet; Take 1 tablet (15 mg total) by mouth as needed.  Dispense: 90 tablet; Refill: 0  6. Gastro-esophageal reflux disease without esophagitis  - omeprazole (PRILOSEC) 20 MG capsule; Take 1 capsule (20 mg total) by mouth daily.  Dispense: 90 capsule; Refill: 1

## 2019-05-20 LAB — MICROALBUMIN / CREATININE URINE RATIO
Creatinine, Urine: 100 mg/dL (ref 20–275)
Microalb Creat Ratio: 16 mcg/mg creat (ref <30)
Microalb, Ur: 1.6 mg/dL

## 2019-05-30 ENCOUNTER — Ambulatory Visit
Admission: RE | Admit: 2019-05-30 | Discharge: 2019-05-30 | Disposition: A | Discharge status: Home / Self Care | Payer: Medicare HMO | Source: Ambulatory Visit | Attending: Family Medicine | Admitting: Family Medicine

## 2019-05-30 DIAGNOSIS — Z1231 Encounter for screening mammogram for malignant neoplasm of breast: Secondary | ICD-10-CM | POA: Insufficient documentation

## 2019-07-19 ENCOUNTER — Other Ambulatory Visit: Payer: Self-pay | Admitting: Family Medicine

## 2019-07-19 DIAGNOSIS — E1121 Type 2 diabetes mellitus with diabetic nephropathy: Secondary | ICD-10-CM

## 2019-08-04 DIAGNOSIS — C44311 Basal cell carcinoma of skin of nose: Secondary | ICD-10-CM | POA: Diagnosis not present

## 2019-08-04 DIAGNOSIS — L570 Actinic keratosis: Secondary | ICD-10-CM | POA: Diagnosis not present

## 2019-08-04 DIAGNOSIS — L821 Other seborrheic keratosis: Secondary | ICD-10-CM | POA: Diagnosis not present

## 2019-08-04 DIAGNOSIS — L814 Other melanin hyperpigmentation: Secondary | ICD-10-CM | POA: Diagnosis not present

## 2019-08-11 ENCOUNTER — Telehealth: Payer: Self-pay | Admitting: Family Medicine

## 2019-08-11 NOTE — Chronic Care Management (AMB) (Signed)
  Chronic Care Management   Outreach Note  08/11/2019 Name: Michelle Montgomery MRN: [id number] DOB: 03-21-1950  Michelle Montgomery is a 69 y.o. year old female who is a primary care patient of Steele Sizer, MD. I reached out to Michelle Montgomery by phone today in response to a referral sent by Ms. Leonie Man Ontiveros's health plan.     An unsuccessful telephone outreach was attempted today. The patient was referred to the case management team for assistance with care management and care coordination.   Follow Up Plan: A HIPPA compliant phone message was left for the patient providing contact information and requesting a return call. The care management team will reach out to the patient again over the next 7 days. If patient returns call to provider office, please advise to call [place] at [phone].  [place], [place] [address] Direct Dial: [phone] [email or web address] Website: [email or web address]

## 2019-08-17 ENCOUNTER — Ambulatory Visit: Payer: Medicare HMO | Admitting: Family Medicine

## 2019-08-18 NOTE — Chronic Care Management (AMB) (Signed)
  Chronic Care Management   Outreach Note  08/18/2019 Name: Michelle Montgomery MRN: [id number] DOB: 1950-09-30  Michelle Montgomery is a 69 y.o. year old female who is a primary care patient of Steele Sizer, MD. I reached out to Michelle Montgomery by phone today in response to a referral sent by Michelle Montgomery's health plan.     A second unsuccessful telephone outreach was attempted today. The patient was referred to the case management team for assistance with care management and care coordination.   Follow Up Plan: A HIPPA compliant phone message was left for the patient providing contact information and requesting a return call. The care management team will reach out to the patient again over the next 7 days. If patient returns call to provider office, please advise to call [place] at [phone].  [place],  [address] Direct Dial: [phone] [email or web address] Website: [email or web address]

## 2019-08-22 NOTE — Chronic Care Management (AMB) (Signed)
  Chronic Care Management   Outreach Note  08/22/2019 Name: ROSARIO DUEY MRN: [id number] DOB: [birth year]  BRYANN GENTZ is a 69 y.o. year old female who is a primary care patient of Steele Sizer, MD. I reached out to [name] by phone today in response to a referral sent by Ms. Leonie Man Stanbery's health plan.     Third unsuccessful telephone outreach was attempted today. The patient was referred to the case management team for assistance with care management and care coordination. The patient's primary care provider has been notified of our unsuccessful attempts to make or maintain contact with the patient. The care management team is pleased to engage with this patient at any time in the future should he/she be interested in assistance from the care management team.   Follow Up Plan: A HIPPA compliant phone message was left for the patient providing contact information and requesting a return call. The care management team is available to follow up with the patient after provider conversation with the patient regarding recommendation for care management engagement and subsequent re-referral to the care management team.   [place], [place], [place] [address] Direct Dial: [place] [email or web address] Website: [email or web address]

## 2019-08-25 ENCOUNTER — Ambulatory Visit: Payer: Medicare HMO

## 2019-09-06 ENCOUNTER — Ambulatory Visit: Payer: Medicare HMO

## 2019-10-11 ENCOUNTER — Ambulatory Visit: Payer: Medicare HMO | Admitting: Family Medicine

## 2019-10-11 ENCOUNTER — Ambulatory Visit: Payer: Medicare HMO

## 2019-11-02 DIAGNOSIS — R69 Illness, unspecified: Secondary | ICD-10-CM | POA: Diagnosis not present

## 2019-12-11 ENCOUNTER — Other Ambulatory Visit: Payer: Self-pay | Admitting: Family Medicine

## 2019-12-11 DIAGNOSIS — E785 Hyperlipidemia, unspecified: Secondary | ICD-10-CM

## 2019-12-11 DIAGNOSIS — I10 Essential (primary) hypertension: Secondary | ICD-10-CM

## 2019-12-11 DIAGNOSIS — R809 Proteinuria, unspecified: Secondary | ICD-10-CM

## 2019-12-11 DIAGNOSIS — E1121 Type 2 diabetes mellitus with diabetic nephropathy: Secondary | ICD-10-CM

## 2019-12-11 NOTE — Telephone Encounter (Signed)
Requested medication (s) are due for refill today: yes  Requested medication (s) are on the active medication list: yes  Last refill: 08/12/2019  Future visit scheduled: yes  Notes to clinic:  Patient has appointment tomorrow    Requested Prescriptions  Pending Prescriptions Disp Refills   amLODipine ([place]) 5 MG tablet [Pharmacy Med Name: AMLODIPINE TAB 5MG ] 90 tablet 1    Sig: TAKE 1 TABLET DAILY      Cardiovascular:  Calcium Channel Blockers Failed - 12/11/2019  8:12 PM      Failed - Valid encounter within last 6 months    Recent Outpatient Visits           6 months ago Type 2 diabetes with nephropathy [facility])   [facility] [place], Drue Stager, MD   11 months ago Type 2 diabetes with nephropathy [facility])   [facility] Steele Sizer, MD   1 year ago Type 2 diabetes with nephropathy [facility])   [facility] Sizer, MD   1 year ago Type 2 diabetes with nephropathy [facility])   [facility] [place], Drue Stager, MD   2 years ago Uncontrolled type 2 diabetes mellitus with nephropathy [facility])   [facility] Steele Sizer, MD       Future Appointments             Tomorrow Steele Sizer, MD [facility], [place]   In 2 days  [place] BP in normal range    BP Readings from Last 1 Encounters:  05/19/19 132/70            benazepril (LOTENSIN) 40 MG tablet [Pharmacy Med Name: BENAZEPRIL TAB 40MG ] 90 tablet 1    Sig: TAKE 1 TABLET DAILY      Cardiovascular:  ACE Inhibitors Failed - 12/11/2019  8:12 PM      Failed - Cr in normal range and within 180 days    Creat  Date Value Ref Range Status  08/26/2018 0.75 0.50 - 0.99 mg/dL Final    Comment:    For patients >17 years of age, the reference limit for Creatinine is approximately 13% higher for people identified as African-American. .    Creatinine, Urine   Date Value Ref Range Status  05/19/2019 100 20 - 275 mg/dL Final          Failed - K in normal range and within 180 days    Potassium  Date Value Ref Range Status  08/26/2018 4.1 3.5 - 5.3 mmol/L Final          Failed - Valid encounter within last 6 months    Recent Outpatient Visits           6 months ago Type 2 diabetes with nephropathy [facility])   [facility] Sizer, MD   11 months ago Type 2 diabetes with nephropathy [facility])   [facility]izer, MD   1 year ago Type 2 diabetes with nephropathy [facility])   [facility] Sizer, MD   1 year ago Type 2 diabetes with nephropathy [facility])   [facility] Sizer, MD   2 years ago Uncontrolled type 2 diabetes mellitus with nephropathy [facility])   [facility] Steele Sizer, MD       Future Appointments  Andrey Campanile, MD [facility], [place]   In 2 days  [facility] [place] - Patient is not pregnant      Passed - Last BP in normal range    BP Readings from Last 1 Encounters:  05/19/19 132/70            atorvastatin (LIPITOR) 40 MG tablet [Pharmacy Med Name: ATORVASTATIN TAB 40MG ] 90 tablet 1    Sig: TAKE 1 TABLET DAILY      Cardiovascular:  Antilipid - Statins Failed - 12/11/2019  8:12 PM      Failed - Total Cholesterol in normal range and within 360 days    Cholesterol, Total  Date Value Ref Range Status  11/06/2014 108 100 - 199 mg/dL Final   Cholesterol  Date Value Ref Range Status  08/26/2018 164 <200 mg/dL Final          Failed - LDL in normal range and within 360 days    LDL Cholesterol (Calc)  Date Value Ref Range Status  08/26/2018 99 mg/dL (calc) Final    Comment:    Reference range: <100 . Desirable range <100 mg/dL for primary prevention;   <70 mg/dL for patients with CHD or diabetic patients  with  > or = 2 CHD risk factors. Marland Kitchen LDL-C is now calculated using the Martin-Hopkins  calculation, which is a validated novel method providing  better accuracy than the Friedewald equation in the  estimation of LDL-C.  Cresenciano Genre et al. Annamaria Helling. 6283;662(94): 2061-2068  (http://education.[employer].com/faq/FAQ164)           Failed - HDL in normal range and within 360 days    HDL  Date Value Ref Range Status  08/26/2018 37 (L) > OR = 50 mg/dL Final  11/06/2014 36 (L) >39 mg/dL Final    Comment:    According to ATP-III Guidelines, HDL-C >59 mg/dL is considered a negative risk factor for CHD.           Failed - Triglycerides in normal range and within 360 days    Triglycerides  Date Value Ref Range Status  08/26/2018 184 (H) <150 mg/dL Final          Passed - Patient is not pregnant      Passed - Valid encounter within last 12 months    Recent Outpatient Visits           6 months ago Type 2 diabetes with nephropathy [facility])   [facility] [place], Drue Stager, MD   11 months ago Type 2 diabetes with nephropathy [facility])   [facility]izer, MD   1 year ago Type 2 diabetes with nephropathy [facility])   [facility], MD   1 year ago Type 2 diabetes with nephropathy [facility])   [facility] [place], Drue Stager, MD   2 years ago Uncontrolled type 2 diabetes mellitus with nephropathy [facility])   [facility] Sizer, MD       Future Appointments             Tomorrow Steele Sizer, MD [facility] [place], [place]   In 2 days  [facility]              pioglitazone (ACTOS) 30 MG tablet [Pharmacy Med Name: PIOGLITAZONE TAB 30MG ] 90 tablet 1    Sig: TAKE 1 TABLET  DAILY        (DISCONTINUE 15MG )      Endocrinology:  Diabetes - Glitazones - pioglitazone Failed - 12/11/2019  8:12 PM      Failed - HBA1C is between 0 and 7.9 and within  180 days    HbA1c, POC (controlled diabetic range)  Date Value Ref Range Status  05/19/2019 9.3 (A) 0.0 - 7.0 % Final          Failed - Valid encounter within last 6 months    Recent Outpatient Visits           6 months ago Type 2 diabetes with nephropathy [facility])   [facility]izer, MD   11 months ago Type 2 diabetes with nephropathy [facility])   [facility]izer, MD   1 year ago Type 2 diabetes with nephropathy [facility])   [facility], MD   1 year ago Type 2 diabetes with nephropathy [facility])   [facility]izer, MD   2 years ago Uncontrolled type 2 diabetes mellitus with nephropathy [facility])   [facility] Sizer, MD       Future Appointments             Tomorrow Steele Sizer, MD [facility], [place]   In 2 days  [place]

## 2019-12-12 ENCOUNTER — Encounter: Payer: Self-pay | Admitting: Family Medicine

## 2019-12-12 ENCOUNTER — Ambulatory Visit (INDEPENDENT_AMBULATORY_CARE_PROVIDER_SITE_OTHER): Payer: Medicare HMO | Admitting: Family Medicine

## 2019-12-12 ENCOUNTER — Other Ambulatory Visit: Payer: Self-pay

## 2019-12-12 VITALS — BP 128/72 | HR 65 | Temp 98.4°F | Resp 18 | Ht 70.0 in | Wt 238.5 lb

## 2019-12-12 DIAGNOSIS — G8929 Other chronic pain: Secondary | ICD-10-CM

## 2019-12-12 DIAGNOSIS — I10 Essential (primary) hypertension: Secondary | ICD-10-CM

## 2019-12-12 DIAGNOSIS — M25561 Pain in right knee: Secondary | ICD-10-CM | POA: Diagnosis not present

## 2019-12-12 DIAGNOSIS — R809 Proteinuria, unspecified: Secondary | ICD-10-CM

## 2019-12-12 DIAGNOSIS — E1121 Type 2 diabetes mellitus with diabetic nephropathy: Secondary | ICD-10-CM | POA: Diagnosis not present

## 2019-12-12 DIAGNOSIS — M159 Polyosteoarthritis, unspecified: Secondary | ICD-10-CM

## 2019-12-12 DIAGNOSIS — M8580 Other specified disorders of bone density and structure, unspecified site: Secondary | ICD-10-CM | POA: Diagnosis not present

## 2019-12-12 DIAGNOSIS — E785 Hyperlipidemia, unspecified: Secondary | ICD-10-CM

## 2019-12-12 DIAGNOSIS — Z780 Asymptomatic menopausal state: Secondary | ICD-10-CM

## 2019-12-12 DIAGNOSIS — K219 Gastro-esophageal reflux disease without esophagitis: Secondary | ICD-10-CM | POA: Diagnosis not present

## 2019-12-12 DIAGNOSIS — M8949 Other hypertrophic osteoarthropathy, multiple sites: Secondary | ICD-10-CM | POA: Diagnosis not present

## 2019-12-12 DIAGNOSIS — M25562 Pain in left knee: Secondary | ICD-10-CM

## 2019-12-12 DIAGNOSIS — G4700 Insomnia, unspecified: Secondary | ICD-10-CM

## 2019-12-12 LAB — POCT GLYCOSYLATED HEMOGLOBIN (HGB A1C): Hemoglobin A1C: 7.9 % — AB (ref 4.0–5.6)

## 2019-12-12 MED ORDER — GLIPIZIDE ER 2.5 MG PO TB24: 2.5000 mg | ORAL_TABLET | Freq: Every day | ORAL | 0 refills | Status: DC

## 2019-12-12 MED ORDER — ATENOLOL-CHLORTHALIDONE 50-25 MG PO TABS: 1.0000 | ORAL_TABLET | Freq: Every day | ORAL | 1 refills | Status: DC

## 2019-12-12 MED ORDER — OMEPRAZOLE 20 MG PO CPDR: 20.0000 mg | DELAYED_RELEASE_CAPSULE | Freq: Every day | ORAL | 1 refills | Status: DC

## 2019-12-12 MED ORDER — ATORVASTATIN CALCIUM 40 MG PO TABS: 40.0000 mg | ORAL_TABLET | Freq: Every day | ORAL | 1 refills | Status: DC

## 2019-12-12 MED ORDER — MELOXICAM 15 MG PO TABS: 15.0000 mg | ORAL_TABLET | ORAL | 0 refills | Status: DC | PRN

## 2019-12-12 MED ORDER — METFORMIN HCL ER 750 MG PO TB24: 1500.0000 mg | ORAL_TABLET | Freq: Every day | ORAL | 1 refills | Status: DC

## 2019-12-12 MED ORDER — PIOGLITAZONE HCL 30 MG PO TABS: 30.0000 mg | ORAL_TABLET | Freq: Every day | ORAL | 1 refills | Status: DC

## 2019-12-12 MED ORDER — BENAZEPRIL HCL 40 MG PO TABS: 40.0000 mg | ORAL_TABLET | Freq: Every day | ORAL | 1 refills | Status: DC

## 2019-12-12 NOTE — Progress Notes (Signed)
Name: Michelle Montgomery   MRN: [id number]    DOB: [birth year]   Date:12/12/2019       Progress Note  Subjective  Chief Complaint  Follow up   HPI   DMII with nephropathy: A1C had spiked earlier this year to 9.3 %, since last visit she changed her diet, was more compliant with metformin and pioglitazone, did not take glipizide 5 mg and A1C is down to 7.9 %, advised to resume glipizide at lower dose of 2.5 mg and monitor. Continue dietary changes. . She was following the [employer], but since September she has been out of town. She is on statin therapy, she has been checking glucose at home and fasting has been around 120. No polyphagia but always feels thirsty and has polyuria.    HTN: taking bp medication as prescribed, denies chest pain, palpitation, edema, dizzinessor decrease in exercise tolerance. BP is at goal today , and states at home also at goal. She is not sure if taking norvasc 5 mg or 10 mg, she will let me know before I send a refill.   Hyperlipidemia: taking Atorvastatin, she denies myalgia. Discussed checking level today   Insomnia:she is now off Ambien, taking melatonin and Tylenol Pm- discussed risk of Tylenol pm , and sleeps well most of the time.She has nocturia around 3 am and 6 am, that is stable.  Stable   Lack of sense of smell and taste: symptoms started about about one year ago. She still can't smell dirty diapers, skunk smell or her dogs bad breath. She saw ENT and is on a different nasal steroid and states she is smelling a little better now. He suggested MRI brain but she decided to hold off She states she has noticed mild improvement lately.   GERD: taking Omeprazole, and no heartburn or regurgitation noticed. She is doing well, she states when she skips medication for 3 days symptoms returns, advised to take it every other day  Obesity:she has multiple co-morbidities,including DM, HTN and OA. She stopped Ozempic because of cost, but weight is  stable now   OA: she has noticed worsening of knee pain, also instability, ready to go to Ortho, she would like to see Dr. Marry Guan. Pain is dull , pain comes and goes, but lately right knee pain has been daily.   Osteopenia: FRAX 9.3% of major fracture and 1.2% of hip fracture, continue vitamin Dalso calcium, discussed high calcium diet, she is taking supplementation   Patient Active Problem List   Diagnosis Date Noted  . Varicose veins of both lower extremities 06/12/2016  . BPV (benign positional vertigo), right 08/24/2015  . Smell disturbance 01/16/2015  . Osteoarthritis 11/03/2014  . Cramps of lower extremity 11/03/2014  . Neuralgia neuritis, sciatic nerve 11/03/2014  . Insomnia, persistent 11/03/2014  . Dyslipidemia 11/03/2014  . Type 2 diabetes with nephropathy ([place]) 11/03/2014  . Essential (primary) hypertension 11/03/2014  . History of colon polyps 11/03/2014  . H/O malignant neoplasm of skin 11/03/2014  . Microalbuminuria 11/03/2014  . Obesity, morbid ([place]) 11/03/2014  . Osteopenia 11/03/2014  . Plantar fasciitis 11/03/2014  . Gastro-esophageal reflux disease without esophagitis 11/03/2014  . Bundle branch block, right 11/03/2014    Past Surgical History:  Procedure Laterality Date  . BREAST BIOPSY Left 2015   benign  . FOOT SURGERY Left 08/29/2011   Spur Removal , and Achilles Tendon Tendolysis  . VARICOSE VEIN SURGERY Bilateral     Family History  Problem Relation Age of Onset  .  Cancer Mother        Colon and breast  . Anemia Mother   . Hypertension Mother   . Breast cancer Mother   . Cancer Father        Esophageal  . Heart disease Father   . Diabetes Brother   . Cancer Maternal Uncle        Colon  . Cancer Maternal Grandmother        Colon  . Healthy Brother   . Melanoma Brother     Social History   Tobacco Use  . Smoking status: Former Smoker    Packs/day: 0.25    Years: 2.00    Pack years: 0.50    Types: Cigarettes    Quit date:  02/10/1970    Years since quitting: 49.8  . Smokeless tobacco: Never Used  . Tobacco comment: smoking cessation materials not required  Substance Use Topics  . Alcohol use: No    Alcohol/week: 0.0 standard drinks     Current Outpatient Medications:  .  amLODipine (NORVASC) 5 MG tablet, Take 1 tablet (5 mg total) by mouth daily. (Patient taking differently: Take 10 mg by mouth daily. ), Disp: 90 tablet, Rfl: 1 .  aspirin 81 MG tablet, Take 1 tablet by mouth daily., Disp: , Rfl:  .  atenolol-chlorthalidone (TENORETIC) 50-25 MG tablet, Take 1 tablet by mouth daily., Disp: 90 tablet, Rfl: 1 .  atorvastatin (LIPITOR) 40 MG tablet, Take 1 tablet (40 mg total) by mouth daily., Disp: 90 tablet, Rfl: 1 .  benazepril (LOTENSIN) 40 MG tablet, Take 1 tablet (40 mg total) by mouth daily., Disp: 90 tablet, Rfl: 1 .  CALCIUM CARBONATE-VIT D-MIN PO, Take 2 tablets by mouth daily. 600 mg bid, Disp: , Rfl:  .  Cholecalciferol (VITAMIN D) 2000 UNITS CAPS, Take 1 tablet by mouth daily., Disp: , Rfl:  .  Coenzyme Q10 (COQ-10) 150 MG CAPS, Take 2 tablets by mouth daily. , Disp: , Rfl:  .  conjugated estrogens (PREMARIN) vaginal cream, Place 1 Applicatorful vaginally as needed. , Disp: , Rfl:  .  Melatonin 5 MG TABS, Take 1 tablet by mouth at bedtime., Disp: , Rfl:  .  meloxicam (MOBIC) 15 MG tablet, Take 1 tablet (15 mg total) by mouth as needed., Disp: 90 tablet, Rfl: 0 .  metFORMIN (GLUCOPHAGE-XR) 750 MG 24 hr tablet, Take 2 tablets (1,500 mg total) by mouth daily with breakfast., Disp: 180 tablet, Rfl: 1 .  Omega-3 Fatty Acids (FISH OIL CONCENTRATE) 1000 MG CAPS, Take 1 tablet by mouth daily. , Disp: , Rfl:  .  omeprazole (PRILOSEC) 20 MG capsule, Take 1 capsule (20 mg total) by mouth daily., Disp: 90 capsule, Rfl: 1 .  pioglitazone (ACTOS) 30 MG tablet, Take 1 tablet (30 mg total) by mouth daily., Disp: 90 tablet, Rfl: 1 .  psyllium (METAMUCIL) 0.52 g capsule, Take 1 capsule (0.52 g total) by mouth daily.,  Disp: 90 capsule, Rfl: 0 .  triamcinolone (NASACORT AQ) 55 MCG/ACT AERO nasal inhaler, Place 2 sprays into the nose daily. (Patient taking differently: Place 2 sprays into the nose as needed. ), Disp: 1 Inhaler, Rfl: 0 .  Turmeric 500 MG CAPS, Take 2 capsules by mouth daily., Disp: , Rfl:  .  UNABLE TO FIND, Doterra Onguard and Doterra Copaiba capsules for immunity and joint pain, Disp: , Rfl:  .  glipiZIDE (GLUCOTROL XL) 2.5 MG 24 hr tablet, Take 1 tablet (2.5 mg total) by mouth daily with breakfast., Disp: 90  tablet, Rfl: 0  Allergies  Allergen Reactions  . Codeine   . Penicillins     I personally reviewed active problem list, medication list, allergies, family history, social history, health maintenance with the patient/caregiver today.   ROS  Constitutional: Negative for fever or weight change.  Respiratory: Negative for cough and shortness of breath.   Cardiovascular: Negative for chest pain or palpitations.  Gastrointestinal: Negative for abdominal pain, no bowel changes.  Musculoskeletal: Negative for gait problem or joint swelling.  Skin: Negative for rash.  Neurological: Negative for dizziness or headache.  No other specific complaints in a complete review of systems (except as listed in HPI above).  Objective  Vitals:   12/12/19 1002  BP: 128/72  Pulse: 65  Resp: 18  Temp: 98.4 F (36.9 C)  TempSrc: Oral  SpO2: 100%  Weight: 238 lb 8 oz (108.2 kg)  Height: 5\' 10"  (1.778 m)    Body mass index is 34.22 kg/m.  Physical Exam  Constitutional: Patient appears well-developed and well-nourished. Obese  No distress.  HEENT: head atraumatic, normocephalic, pupils equal and reactive to light, neck supple Cardiovascular: Normal rate, regular rhythm and normal heart sounds.  No murmur heard. No BLE edema. Pulmonary/Chest: Effort normal and breath sounds normal. No respiratory distress. Abdominal: Soft.  There is no tenderness. Muscular Skeletal: crepitus of both knees,  no effusion  Psychiatric: Patient has a normal mood and affect. behavior is normal. Judgment and thought content normal.  Recent Results (from the past 2160 hour(s))  POCT HgB A1C     Status: Abnormal   Collection Time: 12/12/19 10:06 AM  Result Value Ref Range   Hemoglobin A1C 7.9 (A) 4.0 - 5.6 %   HbA1c POC (<> result, manual entry)     HbA1c, POC (prediabetic range)     HbA1c, POC (controlled diabetic range)       PHQ2/9: Depression screen [facility] 2/9 12/12/2019 05/19/2019 12/29/2018 08/25/2018 08/24/2018  Decreased Interest 0 1 0 0 0  Down, Depressed, Hopeless 0 0 0 0 0  PHQ - 2 Score 0 1 0 0 0  Altered sleeping - 1 0 0 0  Tired, decreased energy - 1 0 0 1  Change in appetite - 2 0 0 0  Feeling bad or failure about yourself  - 0 0 0 0  Trouble concentrating - 0 0 0 0  Moving slowly or fidgety/restless - 0 0 0 0  Suicidal thoughts - 0 0 0 0  PHQ-9 Score - 5 0 0 1  Difficult doing work/chores - Not difficult at all - - Not difficult at all    phq 9 is negative   Fall Risk: Fall Risk  12/12/2019 05/19/2019 12/29/2018 08/25/2018 08/24/2018  Falls in the past year? 0 0 0 0 0  Number falls in past yr: 0 0 0 0 0  Injury with Fall? 0 0 0 0 0  Risk for fall due to : - - - - -  Risk for fall due to: Comment - - - - -  Follow up - - - - Falls prevention discussed    Functional Status Survey: Is the patient deaf or have difficulty hearing?: No Does the patient have difficulty seeing, even when wearing glasses/contacts?: No Does the patient have difficulty concentrating, remembering, or making decisions?: No Does the patient have difficulty walking or climbing stairs?: Yes Does the patient have difficulty dressing or bathing?: No Does the patient have difficulty doing errands alone such as visiting a  doctor's office or shopping?: No    Assessment & Plan  1. Type 2 diabetes with nephropathy (HCC)  - POCT HgB A1C - [facility] Diabetes Foot Exam - metFORMIN (GLUCOPHAGE-XR) 750 MG 24 hr  tablet; Take 2 tablets (1,500 mg total) by mouth daily with breakfast.  Dispense: 180 tablet; Refill: 1 - pioglitazone (ACTOS) 30 MG tablet; Take 1 tablet (30 mg total) by mouth daily.  Dispense: 90 tablet; Refill: 1 - glipiZIDE (GLUCOTROL XL) 2.5 MG 24 hr tablet; Take 1 tablet (2.5 mg total) by mouth daily with breakfast.  Dispense: 90 tablet; Refill: 0  2. Essential (primary) hypertension  - atenolol-chlorthalidone (TENORETIC) 50-25 MG tablet; Take 1 tablet by mouth daily.  Dispense: 90 tablet; Refill: 1 - benazepril (LOTENSIN) 40 MG tablet; Take 1 tablet (40 mg total) by mouth daily.  Dispense: 90 tablet; Refill: 1 - COMPLETE METABOLIC PANEL WITH GFR - CBC with Differential/Platelet  3. Primary osteoarthritis involving multiple joints  - Ambulatory referral to Orthopedic Surgery - meloxicam (MOBIC) 15 MG tablet; Take 1 tablet (15 mg total) by mouth as needed.  Dispense: 90 tablet; Refill: 0  4. Gastro-esophageal reflux disease without esophagitis  - omeprazole (PRILOSEC) 20 MG capsule; Take 1 capsule (20 mg total) by mouth daily.  Dispense: 90 capsule; Refill: 1  5. Dyslipidemia  - atorvastatin (LIPITOR) 40 MG tablet; Take 1 tablet (40 mg total) by mouth daily.  Dispense: 90 tablet; Refill: 1 - Lipid panel  6. Insomnia, persistent   7. Osteopenia after menopause   8. Microalbuminuria  - benazepril (LOTENSIN) 40 MG tablet; Take 1 tablet (40 mg total) by mouth daily.  Dispense: 90 tablet; Refill: 1  9. Morbid obesity, unspecified obesity type [facility])  Discussed with the patient the risk posed by an increased BMI. Discussed importance of portion control, calorie counting and at least 150 minutes of physical activity weekly. Avoid sweet beverages and drink more water. Eat at least 6 servings of fruit and vegetables daily   10. Chronic pain of both knees  - Ambulatory referral to Orthopedic Surgery

## 2019-12-13 ENCOUNTER — Ambulatory Visit (INDEPENDENT_AMBULATORY_CARE_PROVIDER_SITE_OTHER): Payer: Medicare HMO

## 2019-12-13 VITALS — BP 126/84 | HR 77 | Temp 98.3°F | Resp 16 | Ht 70.0 in | Wt 230.6 lb

## 2019-12-13 DIAGNOSIS — Z0000 Encounter for general adult medical examination without abnormal findings: Secondary | ICD-10-CM | POA: Diagnosis not present

## 2019-12-13 LAB — CBC WITH DIFFERENTIAL/PLATELET
Absolute Monocytes: 599 cells/uL (ref 200–950)
Basophils Absolute: 29 cells/uL (ref 0–200)
Basophils Relative: 0.5 %
Eosinophils Absolute: 319 cells/uL (ref 15–500)
Eosinophils Relative: 5.6 %
HCT: 40.5 % (ref 35.0–45.0)
Hemoglobin: 13.4 g/dL (ref 11.7–15.5)
Lymphs Abs: 1419 cells/uL (ref 850–3900)
MCH: 31 pg (ref 27.0–33.0)
MCHC: 33.1 g/dL (ref 32.0–36.0)
MCV: 93.8 fL (ref 80.0–100.0)
MPV: 8.8 fL (ref 7.5–12.5)
Monocytes Relative: 10.5 %
Neutro Abs: 3335 cells/uL (ref 1500–7800)
Neutrophils Relative %: 58.5 %
Platelets: 219 10*3/uL (ref 140–400)
RBC: 4.32 10*6/uL (ref 3.80–5.10)
RDW: 12.8 % (ref 11.0–15.0)
Total Lymphocyte: 24.9 %
WBC: 5.7 10*3/uL (ref 3.8–10.8)

## 2019-12-13 LAB — COMPLETE METABOLIC PANEL WITH GFR
AG Ratio: 1.8 (calc) (ref 1.0–2.5)
ALT: 18 U/L (ref 6–29)
AST: 17 U/L (ref 10–35)
Albumin: 4.8 g/dL (ref 3.6–5.1)
Alkaline phosphatase (APISO): 38 U/L (ref 37–153)
BUN: 18 mg/dL (ref 7–25)
CO2: 31 mmol/L (ref 20–32)
Calcium: 10.3 mg/dL (ref 8.6–10.4)
Chloride: 99 mmol/L (ref 98–110)
Creat: 0.73 mg/dL (ref 0.60–0.93)
GFR, Est African American: 97 mL/min/{1.73_m2} (ref >=60)
GFR, Est Non African American: 83 mL/min/{1.73_m2} (ref >=60)
Globulin: 2.6 g/dL (calc) (ref 1.9–3.7)
Glucose, Bld: 157 mg/dL — ABNORMAL HIGH (ref 65–99)
Potassium: 4.3 mmol/L (ref 3.5–5.3)
Sodium: 138 mmol/L (ref 135–146)
Total Bilirubin: 0.5 mg/dL (ref 0.2–1.2)
Total Protein: 7.4 g/dL (ref 6.1–8.1)

## 2019-12-13 LAB — LIPID PANEL
Cholesterol: 123 mg/dL (ref <200)
HDL: 44 mg/dL — ABNORMAL LOW (ref >=50)
LDL Cholesterol (Calc): 53 mg/dL (calc)
Non-HDL Cholesterol (Calc): 79 mg/dL (calc) (ref <130)
Total CHOL/HDL Ratio: 2.8 (calc) (ref <5.0)
Triglycerides: 183 mg/dL — ABNORMAL HIGH (ref <150)

## 2019-12-13 NOTE — Progress Notes (Signed)
Subjective:   Michelle Montgomery is a 69 y.o. female who presents for Medicare Annual (Subsequent) preventive examination.  Review of Systems     Cardiac Risk Factors include: advanced age (>62men, >30 women);diabetes mellitus;hypertension;dyslipidemia;obesity (BMI >30kg/m2)     Objective:    Today's Vitals   12/13/19 1421 12/13/19 1422  BP: 126/84   Pulse: 77   Resp: 16   Temp: 98.3 F (36.8 C)   TempSrc: Oral   SpO2: 96%   Weight: 230 lb 9.6 oz (104.6 kg)   Height: 5\' 10"  (1.778 m)   PainSc:  0-No pain   Body mass index is 33.09 kg/m.  Advanced Directives 12/13/2019 08/24/2018 08/20/2017 10/29/2016 06/12/2016 02/12/2016 08/24/2015  Does Patient Have a Medical Advance Directive? Yes Yes Yes Yes No No;Yes Yes  Type of Advance Directive Living will Living will [place];Living will - - Living will Living will  Does patient want to make changes to medical advance directive? Yes (MAU/Ambulatory/Procedural Areas - Information given) - - - - - No - Patient declined  Copy of [place] in Chart? - - No - copy requested - - - No - copy requested  Would patient like information on creating a medical advance directive? - - - - - - -    Current Medications (verified) Outpatient Encounter Medications as of 12/13/2019  Medication Sig  . amLODipine (NORVASC) 5 MG tablet Take 1 tablet (5 mg total) by mouth daily. (Patient taking differently: Take 10 mg by mouth daily. )  . aspirin 81 MG tablet Take 1 tablet by mouth daily.  Marland Kitchen atenolol-chlorthalidone (TENORETIC) 50-25 MG tablet Take 1 tablet by mouth daily.  Marland Kitchen atorvastatin (LIPITOR) 40 MG tablet Take 1 tablet (40 mg total) by mouth daily.  . benazepril (LOTENSIN) 40 MG tablet Take 1 tablet (40 mg total) by mouth daily.  Marland Kitchen CALCIUM CARBONATE-VIT D-MIN PO Take 2 tablets by mouth daily. 600 mg bid  . Cholecalciferol (VITAMIN D) 2000 UNITS CAPS Take 1 tablet by mouth daily.  . Coenzyme Q10 (COQ-10) 150 MG CAPS  Take 2 tablets by mouth daily.   Marland Kitchen conjugated estrogens (PREMARIN) vaginal cream Place 1 Applicatorful vaginally as needed.   Marland Kitchen glipiZIDE (GLUCOTROL XL) 2.5 MG 24 hr tablet Take 1 tablet (2.5 mg total) by mouth daily with breakfast.  . Melatonin 5 MG TABS Take 1 tablet by mouth at bedtime.  . meloxicam (MOBIC) 15 MG tablet Take 1 tablet (15 mg total) by mouth as needed.  . metFORMIN (GLUCOPHAGE-XR) 750 MG 24 hr tablet Take 2 tablets (1,500 mg total) by mouth daily with breakfast.  . Omega-3 Fatty Acids (FISH OIL CONCENTRATE) 1000 MG CAPS Take 1 tablet by mouth daily.   Marland Kitchen omeprazole (PRILOSEC) 20 MG capsule Take 1 capsule (20 mg total) by mouth daily.  . pioglitazone (ACTOS) 30 MG tablet Take 1 tablet (30 mg total) by mouth daily.  . psyllium (METAMUCIL) 0.52 g capsule Take 1 capsule (0.52 g total) by mouth daily.  Marland Kitchen triamcinolone (NASACORT AQ) 55 MCG/ACT AERO nasal inhaler Place 2 sprays into the nose daily. (Patient taking differently: Place 2 sprays into the nose as needed. )  . Turmeric 500 MG CAPS Take 2 capsules by mouth daily.  Marland Kitchen UNABLE TO FIND Doterra Onguard and Doterra Copaiba capsules for immunity and joint pain   No facility-administered encounter medications on file as of 12/13/2019.    Allergies (verified) Codeine and Penicillins   History: Past Medical History:  Diagnosis Date  . Arthrosis   . Bilateral leg cramps   . Bilateral sciatica   . Chronic insomnia   . Diabetes mellitus without complication ([place])   . Dyslipidemia   . History of colon polyps   . History of skin cancer    Dr. Ave Filter, history of basal cell and squamous cells carcinoma  . Hypertension   . Microalbuminuria    100-DM  . Obesity   . Plantar fasciitis, left    Dr. Milinda Pointer  . Reflux   . Right bundle branch block    Past Surgical History:  Procedure Laterality Date  . BREAST BIOPSY Left 2015   benign  . FOOT SURGERY Left 08/29/2011   Spur Removal , and Achilles Tendon Tendolysis  .  VARICOSE VEIN SURGERY Bilateral    Family History  Problem Relation Age of Onset  . Cancer Mother        Colon and breast  . Anemia Mother   . Hypertension Mother   . Breast cancer Mother   . Cancer Father        Esophageal  . Heart disease Father   . Diabetes Brother   . Cancer Maternal Uncle        Colon  . Cancer Maternal Grandmother        Colon  . Healthy Brother   . Melanoma Brother    Social History   Socioeconomic History  . Marital status: Married    Spouse name: Iona Beard  . Number of children: 2  . Years of education: Not on file  . Highest education level: 12th grade  Occupational History  . Occupation: Retired  Tobacco Use  . Smoking status: Former Smoker    Packs/day: 0.25    Years: 2.00    Pack years: 0.50    Types: Cigarettes    Quit date: 02/10/1970    Years since quitting: 49.8  . Smokeless tobacco: Never Used  . Tobacco comment: smoking cessation materials not required  Vaping Use  . Vaping Use: Never used  Substance and Sexual Activity  . Alcohol use: No    Alcohol/week: 0.0 standard drinks  . Drug use: No  . Sexual activity: Yes  Other Topics Concern  . Not on file  Social History Narrative  . Not on file   Social Determinants of Health   Financial Resource Strain: Low Risk   . Difficulty of Paying Living Expenses: Not hard at all  Food Insecurity: No Food Insecurity  . Worried About Charity fundraiser in the Last Year: Never true  . Ran Out of Food in the Last Year: Never true  Transportation Needs: No Transportation Needs  . Lack of Transportation (Medical): No  . Lack of Transportation (Non-Medical): No  Physical Activity: Inactive  . Days of Exercise per Week: 0 days  . Minutes of Exercise per Session: 0 min  Stress: No Stress Concern Present  . Feeling of Stress : Not at all  Social Connections: Moderately Integrated  . Frequency of Communication with Friends and Family: More than three times a week  . Frequency of Social  Gatherings with Friends and Family: More than three times a week  . Attends Religious Services: More than 4 times per year  . Active Member of Clubs or Organizations: No  . Attends Archivist Meetings: Never  . Marital Status: Married    Tobacco Counseling Counseling given: Not Answered Comment: smoking cessation materials not required   Clinical Intake:  Pre-visit preparation completed: Yes  Pain : No/denies pain Pain Score: 0-No pain     BMI - recorded: 33.09 Nutritional Status: BMI > 30  Obese Nutritional Risks: None Diabetes: Yes CBG done?: No Did pt. bring in CBG monitor from home?: No  How often do you need to have someone help you when you read instructions, pamphlets, or other written materials from your doctor or pharmacy?: 1 - Never  Nutrition Risk Assessment:  Has the patient had any N/V/D within the last 2 months?  No  Does the patient have any non-healing wounds?  No  Has the patient had any unintentional weight loss or weight gain?  No   Diabetes:  Is the patient diabetic?  No  If diabetic, was a CBG obtained today?  No  Did the patient bring in their glucometer from home?  No  How often do you monitor your CBG's? Daily qam.   Financial Strains and Diabetes Management:  Are you having any financial strains with the device, your supplies or your medication? No .  Does the patient want to be seen by Chronic Care Management for management of their diabetes?  No  Would the patient like to be referred to a Nutritionist or for Diabetic Management?  No   Diabetic Exams:  Diabetic Eye Exam: Completed 08/24/18 negative retinopathy. Overdue for diabetic eye exam. Pt has been advised about the importance in completing this exam. Pt plans to schedule appt with Dr. Gloriann Loan.   Diabetic Foot Exam: Completed 12/12/19.   Interpreter Needed?: No  Information entered by :: Clemetine Marker LPN   Activities of Daily Living In your present state of health, do you  have any difficulty performing the following activities: 12/13/2019 12/12/2019  Hearing? N N  Comment declines hearing aids -  Vision? N N  Difficulty concentrating or making decisions? N N  Walking or climbing stairs? Y Y  Dressing or bathing? N N  Doing errands, shopping? N N  Preparing Food and eating ? N -  Using the Toilet? N -  In the past six months, have you accidently leaked urine? N -  Do you have problems with loss of bowel control? N -  Managing your Medications? N -  Managing your Finances? N -  Housekeeping or managing your Housekeeping? N -  Some recent data might be hidden    Patient Care Team: Steele Sizer, MD as PCP - General (Family Medicine) Rubie Maid, MD as Consulting Physician (Obstetrics and Gynecology) Christene Slates, MD as Consulting Physician (Dermatology)  Indicate any recent Medical Services you may have received from other than [facility] providers in the past year (date may be approximate).     Assessment:   This is a routine wellness examination for Verneal.  Hearing/Vision screen  Hearing Screening   125Hz  250Hz  500Hz  1000Hz  2000Hz  3000Hz  4000Hz  6000Hz  8000Hz   Right ear:           Left ear:           Comments: Pt denies hearing difficulty   Vision Screening Comments: Annual vision screenings done by Dr. Gloriann Loan.   Dietary issues and exercise activities discussed: Current Exercise Habits: The patient does not participate in regular exercise at present, Exercise limited by: None identified  Goals    . DIET - INCREASE WATER INTAKE     Recommend to drink at least 6-8 8oz glasses of water per day.    . Patient Stated     Pt states she would  like to be able to get off of ozempic due to cost and maintain lower fasting blood sugars.       Depression Screen PHQ 2/9 Scores 12/13/2019 12/12/2019 05/19/2019 12/29/2018 08/25/2018 08/24/2018 08/20/2017  PHQ - 2 Score 0 0 1 0 0 0 0  PHQ- 9 Score - - 5 0 0 1 0    Fall Risk Fall Risk  12/13/2019 12/12/2019  05/19/2019 12/29/2018 08/25/2018  Falls in the past year? 0 0 0 0 0  Number falls in past yr: 0 0 0 0 0  Injury with Fall? 0 0 0 0 0  Risk for fall due to : No Fall Risks - - - -  Risk for fall due to: Comment - - - - -  Follow up Falls prevention discussed - - - -    Any stairs in or around the home? Yes  If so, are there any without handrails? No  Home free of loose throw rugs in walkways, pet beds, electrical cords, etc? Yes  Adequate lighting in your home to reduce risk of falls? Yes   ASSISTIVE DEVICES UTILIZED TO PREVENT FALLS:  Life alert? No  Use of a cane, walker or w/c? No  Grab bars in the bathroom? No  Shower chair or bench in shower? Yes  Elevated toilet seat or a handicapped toilet? No   TIMED UP AND GO:  Was the test performed? Yes .  Length of time to ambulate 10 feet: 5 sec.   Gait steady and fast without use of assistive device  Cognitive Function:     6CIT Screen 08/24/2018 08/20/2017  What Year? 0 points 0 points  What month? 0 points 0 points  What time? 0 points 0 points  Count back from 20 0 points 0 points  Months in reverse 0 points 0 points  Repeat phrase 0 points 2 points  Total Score 0 2    Immunizations Immunization History  Administered Date(s) Administered  . Influenza, High Dose Seasonal PF 11/06/2014, 10/29/2016, 10/30/2018, 11/02/2019  . Influenza,inj,Quad PF,6+ Mos 11/06/2014  . Influenza-Unspecified 11/09/2013, 12/13/2015, 12/08/2017  . PFIZER SARS-COV-2 Vaccination 03/19/2019, 04/09/2019, 11/08/2019  . Pneumococcal Conjugate-13 02/28/2014  . Pneumococcal Polysaccharide-23 02/21/2010, 04/10/2015  . Tdap 01/07/2010  . Zoster 10/27/2011  . Zoster Recombinat (Shingrix) 09/18/2017, 01/19/2018    TDAP status: Up to date   Flu Vaccine status: Up to date   Pneumococcal vaccine status: Up to date   Covid-19 vaccine status: Completed vaccines  Qualifies for Shingles Vaccine? Yes   Zostavax completed Yes   Shingrix Completed?:  Yes  Screening Tests Health Maintenance  Topic Date Due  . OPHTHALMOLOGY EXAM  01/11/2020 (Originally 08/24/2019)  . TETANUS/TDAP  01/08/2020  . MAMMOGRAM  05/29/2020  . HEMOGLOBIN A1C  06/10/2020  . FOOT EXAM  12/11/2020  . COLONOSCOPY  10/28/2022  . INFLUENZA VACCINE  Completed  . DEXA SCAN  Completed  . COVID-19 Vaccine  Completed  . Hepatitis C Screening  Completed  . PNA vac Low Risk Adult  Completed    Health Maintenance  There are no preventive care reminders to display for this patient.  Colorectal cancer screening: Completed 10/27/17. Repeat every 5 years   Mammogram status: Completed 05/30/19. Repeat every year   Bone Density status: Completed 09/15/17. Results reflect: Bone density results: OSTEOPENIA. Repeat every 2 years.  Pt requests to postpone at this time.   Lung Cancer Screening: (Low Dose CT Chest recommended if Age 73-80 years, 30 pack-year currently smoking OR  have quit w/in 15years.) does not qualify.   Additional Screening:  Hepatitis C Screening: does qualify; Completed 10/27/11  Vision Screening: Recommended annual ophthalmology exams for early detection of glaucoma and other disorders of the eye. Is the patient up to date with their annual eye exam?  No  Who is the provider or what is the name of the office in which the patient attends annual eye exams? Dr. Gloriann Loan  Dental Screening: Recommended annual dental exams for proper oral hygiene  Community Resource Referral / Chronic Care Management: CRR required this visit?  No   CCM required this visit?  No      Plan:     I have personally reviewed and noted the following in the patient's chart:   . Medical and social history . Use of alcohol, tobacco or illicit drugs  . Current medications and supplements . Functional ability and status . Nutritional status . Physical activity . Advanced directives . List of other physicians . Hospitalizations, surgeries, and ER visits in previous 12  months . Vitals . Screenings to include cognitive, depression, and falls . Referrals and appointments  In addition, I have reviewed and discussed with patient certain preventive protocols, quality metrics, and best practice recommendations. A written personalized care plan for preventive services as well as general preventive health recommendations were provided to patient.     Clemetine Marker, LPN   19/07/2227   Nurse Notes: none

## 2019-12-13 NOTE — Patient Instructions (Signed)
Michelle Montgomery , Thank you for taking time to come for your Medicare Wellness Visit. I appreciate your ongoing commitment to your health goals. Please review the following plan we discussed and let me know if I can assist you in the future.   Screening recommendations/referrals: Colonoscopy: done 10/27/17. Repeat in 2024 Mammogram: done 05/30/19 Bone Density: done 09/15/17 Recommended yearly ophthalmology/optometry visit for glaucoma screening and checkup Recommended yearly dental visit for hygiene and checkup  Vaccinations: Influenza vaccine: done 11/02/19 Pneumococcal vaccine: done 04/10/15 Tdap vaccine: done 01/07/10 Shingles vaccine: done 09/18/17 & 01/19/18   Covid-19:done 03/19/19, 04/09/19 & 11/08/19  Advanced directives: Advance directive discussed with you today. I have provided a copy for you to complete at home and have notarized. Once this is complete please bring a copy in to our office so we can scan it into your chart.  Conditions/risks identified: Recommend increasing physical activity as tolerated  Next appointment: Follow up in one year for your annual wellness visit    Preventive Care 65 Years and Older, Female Preventive care refers to lifestyle choices and visits with your health care provider that can promote health and wellness. What does preventive care include?  A yearly physical exam. This is also called an annual well check.  Dental exams once or twice a year.  Routine eye exams. Ask your health care provider how often you should have your eyes checked.  Personal lifestyle choices, including:  Daily care of your teeth and gums.  Regular physical activity.  Eating a healthy diet.  Avoiding tobacco and drug use.  Limiting alcohol use.  Practicing safe sex.  Taking low-dose aspirin every day.  Taking vitamin and mineral supplements as recommended by your health care provider. What happens during an annual well check? The services and screenings done by  your health care provider during your annual well check will depend on your age, overall health, lifestyle risk factors, and family history of disease. Counseling  Your health care provider may ask you questions about your:  Alcohol use.  Tobacco use.  Drug use.  Emotional well-being.  Home and relationship well-being.  Sexual activity.  Eating habits.  History of falls.  Memory and ability to understand (cognition).  Work and work Statistician.  Reproductive health. Screening  You may have the following tests or measurements:  Height, weight, and BMI.  Blood pressure.  Lipid and cholesterol levels. These may be checked every 5 years, or more frequently if you are over 67 years old.  Skin check.  Lung cancer screening. You may have this screening every year starting at age 42 if you have a 30-pack-year history of smoking and currently smoke or have quit within the past 15 years.  Fecal occult blood test (FOBT) of the stool. You may have this test every year starting at age 57.  Flexible sigmoidoscopy or colonoscopy. You may have a sigmoidoscopy every 5 years or a colonoscopy every 10 years starting at age 24.  Hepatitis C blood test.  Hepatitis B blood test.  Sexually transmitted disease (STD) testing.  Diabetes screening. This is done by checking your blood sugar (glucose) after you have not eaten for a while (fasting). You may have this done every 1-3 years.  Bone density scan. This is done to screen for osteoporosis. You may have this done starting at age 18.  Mammogram. This may be done every 1-2 years. Talk to your health care provider about how often you should have regular mammograms. Talk with your health  care provider about your test results, treatment options, and if necessary, the need for more tests. Vaccines  Your health care provider may recommend certain vaccines, such as:  Influenza vaccine. This is recommended every year.  Tetanus,  diphtheria, and acellular pertussis (Tdap, Td) vaccine. You may need a Td booster every 10 years.  Zoster vaccine. You may need this after age 42.  Pneumococcal 13-valent conjugate (PCV13) vaccine. One dose is recommended after age 68.  Pneumococcal polysaccharide (PPSV23) vaccine. One dose is recommended after age 44. Talk to your health care provider about which screenings and vaccines you need and how often you need them. This information is not intended to replace advice given to you by your health care provider. Make sure you discuss any questions you have with your health care provider. Document Released: 02/23/2015 Document Revised: 10/17/2015 Document Reviewed: 11/28/2014 Elsevier Interactive Patient Education  2017 [place] Prevention in the Home Falls can cause injuries. They can happen to people of all ages. There are many things you can do to make your home safe and to help prevent falls. What can I do on the outside of my home?  Regularly fix the edges of walkways and driveways and fix any cracks.  Remove anything that might make you trip as you walk through a door, such as a raised step or threshold.  Trim any bushes or trees on the path to your home.  Use bright outdoor lighting.  Clear any walking paths of anything that might make someone trip, such as rocks or tools.  Regularly check to see if handrails are loose or broken. Make sure that both sides of any steps have handrails.  Any raised decks and porches should have guardrails on the edges.  Have any leaves, snow, or ice cleared regularly.  Use sand or salt on walking paths during winter.  Clean up any spills in your garage right away. This includes oil or grease spills. What can I do in the bathroom?  Use night lights.  Install grab bars by the toilet and in the tub and shower. Do not use towel bars as grab bars.  Use non-skid mats or decals in the tub or shower.  If you need to sit down in  the shower, use a plastic, non-slip stool.  Keep the floor dry. Clean up any water that spills on the floor as soon as it happens.  Remove soap buildup in the tub or shower regularly.  Attach bath mats securely with double-sided non-slip rug tape.  Do not have throw rugs and other things on the floor that can make you trip. What can I do in the bedroom?  Use night lights.  Make sure that you have a light by your bed that is easy to reach.  Do not use any sheets or blankets that are too big for your bed. They should not hang down onto the floor.  Have a firm chair that has side arms. You can use this for support while you get dressed.  Do not have throw rugs and other things on the floor that can make you trip. What can I do in the kitchen?  Clean up any spills right away.  Avoid walking on wet floors.  Keep items that you use a lot in easy-to-reach places.  If you need to reach something above you, use a strong step stool that has a grab bar.  Keep electrical cords out of the way.  Do not use floor  polish or wax that makes floors slippery. If you must use wax, use non-skid floor wax.  Do not have throw rugs and other things on the floor that can make you trip. What can I do with my stairs?  Do not leave any items on the stairs.  Make sure that there are handrails on both sides of the stairs and use them. Fix handrails that are broken or loose. Make sure that handrails are as long as the stairways.  Check any carpeting to make sure that it is firmly attached to the stairs. Fix any carpet that is loose or worn.  Avoid having throw rugs at the top or bottom of the stairs. If you do have throw rugs, attach them to the floor with carpet tape.  Make sure that you have a light switch at the top of the stairs and the bottom of the stairs. If you do not have them, ask someone to add them for you. What else can I do to help prevent falls?  Wear shoes that:  Do not have high  heels.  Have rubber bottoms.  Are comfortable and fit you well.  Are closed at the toe. Do not wear sandals.  If you use a stepladder:  Make sure that it is fully opened. Do not climb a closed stepladder.  Make sure that both sides of the stepladder are locked into place.  Ask someone to hold it for you, if possible.  Clearly mark and make sure that you can see:  Any grab bars or handrails.  First and last steps.  Where the edge of each step is.  Use tools that help you move around (mobility aids) if they are needed. These include:  Canes.  Walkers.  Scooters.  Crutches.  Turn on the lights when you go into a dark area. Replace any light bulbs as soon as they burn out.  Set up your furniture so you have a clear path. Avoid moving your furniture around.  If any of your floors are uneven, fix them.  If there are any pets around you, be aware of where they are.  Review your medicines with your doctor. Some medicines can make you feel dizzy. This can increase your chance of falling. Ask your doctor what other things that you can do to help prevent falls. This information is not intended to replace advice given to you by your health care provider. Make sure you discuss any questions you have with your health care provider. Document Released: 11/23/2008 Document Revised: 07/05/2015 Document Reviewed: 03/03/2014 Elsevier Interactive Patient Education  2017 [employer].

## 2019-12-29 ENCOUNTER — Other Ambulatory Visit: Payer: Self-pay | Admitting: Family Medicine

## 2019-12-29 DIAGNOSIS — I10 Essential (primary) hypertension: Secondary | ICD-10-CM

## 2019-12-29 MED ORDER — AMLODIPINE BESYLATE 5 MG PO TABS: 5.0000 mg | ORAL_TABLET | Freq: Every day | ORAL | 1 refills | Status: DC

## 2019-12-29 NOTE — Telephone Encounter (Signed)
Medication Refill - Medication: amLODipine (NORVASC) 5 MG tablet (Pharmacy is requesting electronic request for medication be approved.)   Has the patient contacted their pharmacy? yes (Agent: If no, request that the patient contact the pharmacy for the refill.) (Agent: If yes, when and what did the pharmacy advise?)Contact PCP  Preferred Pharmacy (with phone number or street name):  [facility] [place], [place] to [facility]istered [facility] Phone:  [phone]  Fax:  [phone]       Agent: Please be advised that RX refills may take up to 3 business days. We ask that you follow-up with your pharmacy.

## 2020-01-26 DIAGNOSIS — L821 Other seborrheic keratosis: Secondary | ICD-10-CM | POA: Diagnosis not present

## 2020-01-26 DIAGNOSIS — L814 Other melanin hyperpigmentation: Secondary | ICD-10-CM | POA: Diagnosis not present

## 2020-01-26 DIAGNOSIS — C44319 Basal cell carcinoma of skin of other parts of face: Secondary | ICD-10-CM | POA: Diagnosis not present

## 2020-02-09 DIAGNOSIS — M25561 Pain in right knee: Secondary | ICD-10-CM | POA: Diagnosis not present

## 2020-02-09 DIAGNOSIS — M25562 Pain in left knee: Secondary | ICD-10-CM | POA: Diagnosis not present

## 2020-02-09 DIAGNOSIS — M170 Bilateral primary osteoarthritis of knee: Secondary | ICD-10-CM | POA: Diagnosis not present

## 2020-02-20 DIAGNOSIS — M170 Bilateral primary osteoarthritis of knee: Secondary | ICD-10-CM | POA: Diagnosis not present

## 2020-04-11 ENCOUNTER — Ambulatory Visit: Payer: Medicare HMO | Admitting: Family Medicine

## 2020-05-22 NOTE — Progress Notes (Signed)
Name: Michelle Montgomery   MRN: [id number]    DOB: 04/05/1950   Date:05/23/2020       Progress Note  Subjective  Chief Complaint  Follow Up  HPI  DMII with nephropathy: A1C spiked to  9.3 % April 2021 , she was able to changed her diet, was more compliant with metformin and pioglitazone,  and A1C has been stable at  7.9 %, explained that she should get it lower, around 7.5 %, she has not started taking Glipizide 2.5 mg yet . She has been more stressed carrying for her mother and has skipped doses of medication lately. She is on statin therapy and also on ACE for microalbuminuria   No polyphagia but always feels thirsty and has polyuria.    HTN: BP is at goal, she states she has skipped a couple doses of medications lately, she denies chest pain, palpitation, edema, dizzinessor decrease in exercise tolerance.   Hyperlipidemia: taking Atorvastatin, she denies myalgia. Reviewed last labs, LDL at 53   Insomnia:she is now off Ambien, taking melatonin and Tylenol Pm- discussed risk of Tylenol pm , and sleeps well most of the time.She has been very stressed lately, her mother fell and went to the hospital and is now in a facility, she feels bad that she is not at home, she misses her mother. Discussed importance of talking to a counselor. Discussed trying trazodone prn   Lack of sense of smell and taste: symptoms started a couple of years ago She still can't smell dirty diapers, skunk smell or her dogs bad breath. She saw ENT and is on a different nasal steroid and states she is smelling a little better now. He suggested MRI brain but she decided to hold off She states she has noticed mild improvement lately.    GERD: taking Omeprazole, and no heartburn or regurgitation noticed. She is doing well, she states when she skips medication for 3 days symptoms returns, reminded her again to try skipping to every other day   OA: she has noticed worsening of knee pain, also instability, she is  seeing Dr. Marry Guan and he advised to have bilateral knee replacement, she is worried about her mother. She states when more active and taking meloxicam pain improves and would like to resume medication  Osteopenia: FRAX 9.3% of major fracture and 1.2% of hip fracture, continue vitamin Dalso calcium, discussed high calcium diet, she is taking supplementation Advised to get another study done since it was done in 2019   Patient Active Problem List   Diagnosis Date Noted  . Varicose veins of both lower extremities 06/12/2016  . BPV (benign positional vertigo), right 08/24/2015  . Smell disturbance 01/16/2015  . Osteoarthritis 11/03/2014  . Cramps of lower extremity 11/03/2014  . Neuralgia neuritis, sciatic nerve 11/03/2014  . Insomnia, persistent 11/03/2014  . Dyslipidemia 11/03/2014  . Type 2 diabetes with nephropathy ([place]) 11/03/2014  . Essential (primary) hypertension 11/03/2014  . History of colon polyps 11/03/2014  . H/O malignant neoplasm of skin 11/03/2014  . Microalbuminuria 11/03/2014  . Obesity, morbid ([place]) 11/03/2014  . Osteopenia 11/03/2014  . Plantar fasciitis 11/03/2014  . Gastro-esophageal reflux disease without esophagitis 11/03/2014  . Bundle branch block, right 11/03/2014    Past Surgical History:  Procedure Laterality Date  . BREAST BIOPSY Left 2015   benign  . FOOT SURGERY Left 08/29/2011   Spur Removal , and Achilles Tendon Tendolysis  . VARICOSE VEIN SURGERY Bilateral     Family History  Problem  Relation Age of Onset  . Cancer Mother        Colon and breast  . Anemia Mother   . Hypertension Mother   . Breast cancer Mother   . Cancer Father        Esophageal  . Heart disease Father   . Diabetes Brother   . Cancer Maternal Uncle        Colon  . Cancer Maternal Grandmother        Colon  . Healthy Brother   . Melanoma Brother     Social History   Tobacco Use  . Smoking status: Former Smoker    Packs/day: 0.25    Years: 2.00    Pack years:  0.50    Types: Cigarettes    Quit date: 02/10/1970    Years since quitting: 50.3  . Smokeless tobacco: Never Used  . Tobacco comment: smoking cessation materials not required  Substance Use Topics  . Alcohol use: No    Alcohol/week: 0.0 standard drinks     Current Outpatient Medications:  .  amLODipine (NORVASC) 5 MG tablet, Take 1 tablet (5 mg total) by mouth daily., Disp: 90 tablet, Rfl: 1 .  aspirin 81 MG tablet, Take 1 tablet by mouth daily., Disp: , Rfl:  .  atenolol-chlorthalidone (TENORETIC) 50-25 MG tablet, Take 1 tablet by mouth daily., Disp: 90 tablet, Rfl: 1 .  atorvastatin (LIPITOR) 40 MG tablet, Take 1 tablet (40 mg total) by mouth daily., Disp: 90 tablet, Rfl: 1 .  benazepril (LOTENSIN) 40 MG tablet, Take 1 tablet (40 mg total) by mouth daily., Disp: 90 tablet, Rfl: 1 .  CALCIUM CARBONATE-VIT D-MIN PO, Take 2 tablets by mouth daily. 600 mg bid, Disp: , Rfl:  .  Cholecalciferol (VITAMIN D) 2000 UNITS CAPS, Take 1 tablet by mouth daily., Disp: , Rfl:  .  Coenzyme Q10 (COQ-10) 150 MG CAPS, Take 2 tablets by mouth daily. , Disp: , Rfl:  .  glipiZIDE (GLUCOTROL XL) 2.5 MG 24 hr tablet, Take 1 tablet (2.5 mg total) by mouth daily with breakfast., Disp: 90 tablet, Rfl: 0 .  Melatonin 5 MG TABS, Take 1 tablet by mouth at bedtime., Disp: , Rfl:  .  meloxicam (MOBIC) 15 MG tablet, Take 1 tablet (15 mg total) by mouth as needed., Disp: 90 tablet, Rfl: 0 .  metFORMIN (GLUCOPHAGE-XR) 750 MG 24 hr tablet, Take 2 tablets (1,500 mg total) by mouth daily with breakfast., Disp: 180 tablet, Rfl: 1 .  Omega-3 Fatty Acids (FISH OIL CONCENTRATE) 1000 MG CAPS, Take 1 tablet by mouth daily. , Disp: , Rfl:  .  omeprazole (PRILOSEC) 20 MG capsule, Take 1 capsule (20 mg total) by mouth daily., Disp: 90 capsule, Rfl: 1 .  pioglitazone (ACTOS) 30 MG tablet, Take 1 tablet (30 mg total) by mouth daily., Disp: 90 tablet, Rfl: 1 .  psyllium (METAMUCIL) 0.52 g capsule, Take 1 capsule (0.52 g total) by mouth  daily., Disp: 90 capsule, Rfl: 0 .  triamcinolone (NASACORT AQ) 55 MCG/ACT AERO nasal inhaler, Place 2 sprays into the nose daily. (Patient taking differently: Place 2 sprays into the nose as needed.), Disp: 1 Inhaler, Rfl: 0 .  Turmeric 500 MG CAPS, Take 2 capsules by mouth daily., Disp: , Rfl:  .  UNABLE TO FIND, Doterra Onguard and Doterra Copaiba capsules for immunity and joint pain, Disp: , Rfl:   Allergies  Allergen Reactions  . Codeine   . Penicillins     I personally reviewed active  problem list, medication list, allergies, family history, social history, health maintenance with the patient/caregiver today.   ROS  Constitutional: Negative for fever or weight change.  Respiratory: Negative for cough , positive for mild  shortness of breath, she states feels better when more physically active .   Cardiovascular: Negative for chest pain or palpitations.  Gastrointestinal: Negative for abdominal pain, no bowel changes.  Musculoskeletal: positive  for gait problem , positive for intermittent right  knee joint swelling.  Skin: Negative for rash.  Neurological: Negative for dizziness or headache.  No other specific complaints in a complete review of systems (except as listed in HPI above).  Objective  Vitals:   05/23/20 1111  BP: 132/72  Pulse: 76  Resp: 16  Temp: 98.4 F (36.9 C)  TempSrc: Oral  SpO2: 97%  Weight: 233 lb (105.7 kg)  Height: 5\' 10"  (1.778 m)    Body mass index is 33.43 kg/m.  Physical Exam  Constitutional: Patient appears well-developed and well-nourished. Obese No distress.  HEENT: head atraumatic, normocephalic, pupils equal and reactive to light, neck supple Cardiovascular: Normal rate, regular rhythm and normal heart sounds.  No murmur heard. No BLE edema. Pulmonary/Chest: Effort normal and breath sounds normal. No respiratory distress. Abdominal: Soft.  There is no tenderness. Muscular skeletal: crepitus with extension of both knees, some  effusion noticed genu varum  Psychiatric: Patient has a normal mood and affect. behavior is normal. Judgment and thought content normal.  Recent Results (from the past 2160 hour(s))  POCT HgB A1C     Status: Abnormal   Collection Time: 05/23/20 11:15 AM  Result Value Ref Range   Hemoglobin A1C 7.9 (A) 4.0 - 5.6 %   HbA1c POC (<> result, manual entry)     HbA1c, POC (prediabetic range)     HbA1c, POC (controlled diabetic range)       PHQ2/9: Depression screen [facility] 2/9 05/23/2020 12/13/2019 12/12/2019 05/19/2019 12/29/2018  Decreased Interest 0 0 0 1 0  Down, Depressed, Hopeless 0 0 0 0 0  PHQ - 2 Score 0 0 0 1 0  Altered sleeping - - - 1 0  Tired, decreased energy - - - 1 0  Change in appetite - - - 2 0  Feeling bad or failure about yourself  - - - 0 0  Trouble concentrating - - - 0 0  Moving slowly or fidgety/restless - - - 0 0  Suicidal thoughts - - - 0 0  PHQ-9 Score - - - 5 0  Difficult doing work/chores - - - Not difficult at all -    phq 9 is negative   Fall Risk: Fall Risk  05/23/2020 12/13/2019 12/12/2019 05/19/2019 12/29/2018  Falls in the past year? 0 0 0 0 0  Number falls in past yr: 0 0 0 0 0  Injury with Fall? 0 0 0 0 0  Risk for fall due to : - No Fall Risks - - -  Risk for fall due to: Comment - - - - -  Follow up - Falls prevention discussed - - -     Functional Status Survey: Is the patient deaf or have difficulty hearing?: No Does the patient have difficulty seeing, even when wearing glasses/contacts?: No Does the patient have difficulty concentrating, remembering, or making decisions?: No Does the patient have difficulty walking or climbing stairs?: Yes Does the patient have difficulty dressing or bathing?: No Does the patient have difficulty doing errands alone such as visiting a doctor's  office or shopping?: No    Assessment & Plan   1. Type 2 diabetes with nephropathy (HCC)  - POCT HgB A1C - Microalbumin / creatinine urine ratio - glipiZIDE  (GLUCOTROL XL) 2.5 MG 24 hr tablet; Take 1 tablet (2.5 mg total) by mouth daily with breakfast.  Dispense: 90 tablet; Refill: 0 - metFORMIN (GLUCOPHAGE-XR) 750 MG 24 hr tablet; Take 2 tablets (1,500 mg total) by mouth daily with breakfast.  Dispense: 180 tablet; Refill: 1 - pioglitazone (ACTOS) 30 MG tablet; Take 1 tablet (30 mg total) by mouth daily.  Dispense: 90 tablet; Refill: 1  2. Encounter for screening mammogram for breast cancer  - MM 3D SCREEN BREAST BILATERAL; Future  3. Primary osteoarthritis involving multiple joints  - meloxicam (MOBIC) 15 MG tablet; Take 1 tablet (15 mg total) by mouth as needed.  Dispense: 90 tablet; Refill: 0  4. Gastro-esophageal reflux disease without esophagitis  - omeprazole (PRILOSEC) 20 MG capsule; Take 1 capsule (20 mg total) by mouth daily.  Dispense: 90 capsule; Refill: 1  5. Dyslipidemia  - atorvastatin (LIPITOR) 40 MG tablet; Take 1 tablet (40 mg total) by mouth daily.  Dispense: 90 tablet; Refill: 1  6. Essential (primary) hypertension  - amLODipine (NORVASC) 5 MG tablet; Take 1 tablet (5 mg total) by mouth daily.  Dispense: 90 tablet; Refill: 1 - atenolol-chlorthalidone (TENORETIC) 50-25 MG tablet; Take 1 tablet by mouth daily.  Dispense: 90 tablet; Refill: 1 - benazepril (LOTENSIN) 40 MG tablet; Take 1 tablet (40 mg total) by mouth daily.  Dispense: 90 tablet; Refill: 1  7. Chronic pain of both knees  - meloxicam (MOBIC) 15 MG tablet; Take 1 tablet (15 mg total) by mouth as needed.  Dispense: 90 tablet; Refill: 0  8. Osteopenia after menopause  - DG Bone Density; Future  9. Microalbuminuria  - Microalbumin / creatinine urine ratio - benazepril (LOTENSIN) 40 MG tablet; Take 1 tablet (40 mg total) by mouth daily.  Dispense: 90 tablet; Refill: 1   10. Insomnia, persistent  - traZODone (DESYREL) 50 MG tablet; Take 0.5-1 tablets (25-50 mg total) by mouth at bedtime as needed for sleep.  Dispense: 30 tablet; Refill: 0

## 2020-05-23 ENCOUNTER — Ambulatory Visit (INDEPENDENT_AMBULATORY_CARE_PROVIDER_SITE_OTHER): Payer: Medicare HMO | Admitting: Family Medicine

## 2020-05-23 ENCOUNTER — Encounter: Payer: Self-pay | Admitting: Family Medicine

## 2020-05-23 ENCOUNTER — Other Ambulatory Visit: Payer: Self-pay

## 2020-05-23 VITALS — BP 132/72 | HR 76 | Temp 98.4°F | Resp 16 | Ht 70.0 in | Wt 233.0 lb

## 2020-05-23 DIAGNOSIS — Z780 Asymptomatic menopausal state: Secondary | ICD-10-CM

## 2020-05-23 DIAGNOSIS — G8929 Other chronic pain: Secondary | ICD-10-CM

## 2020-05-23 DIAGNOSIS — K219 Gastro-esophageal reflux disease without esophagitis: Secondary | ICD-10-CM | POA: Diagnosis not present

## 2020-05-23 DIAGNOSIS — Z1231 Encounter for screening mammogram for malignant neoplasm of breast: Secondary | ICD-10-CM | POA: Diagnosis not present

## 2020-05-23 DIAGNOSIS — M25561 Pain in right knee: Secondary | ICD-10-CM

## 2020-05-23 DIAGNOSIS — M159 Polyosteoarthritis, unspecified: Secondary | ICD-10-CM

## 2020-05-23 DIAGNOSIS — I10 Essential (primary) hypertension: Secondary | ICD-10-CM | POA: Diagnosis not present

## 2020-05-23 DIAGNOSIS — M8580 Other specified disorders of bone density and structure, unspecified site: Secondary | ICD-10-CM

## 2020-05-23 DIAGNOSIS — M150 Primary generalized (osteo)arthritis: Secondary | ICD-10-CM

## 2020-05-23 DIAGNOSIS — E785 Hyperlipidemia, unspecified: Secondary | ICD-10-CM

## 2020-05-23 DIAGNOSIS — G4700 Insomnia, unspecified: Secondary | ICD-10-CM | POA: Diagnosis not present

## 2020-05-23 DIAGNOSIS — E1121 Type 2 diabetes mellitus with diabetic nephropathy: Secondary | ICD-10-CM

## 2020-05-23 DIAGNOSIS — R809 Proteinuria, unspecified: Secondary | ICD-10-CM | POA: Diagnosis not present

## 2020-05-23 DIAGNOSIS — M8949 Other hypertrophic osteoarthropathy, multiple sites: Secondary | ICD-10-CM | POA: Diagnosis not present

## 2020-05-23 DIAGNOSIS — M25562 Pain in left knee: Secondary | ICD-10-CM

## 2020-05-23 LAB — POCT GLYCOSYLATED HEMOGLOBIN (HGB A1C): Hemoglobin A1C: 7.9 % — AB (ref 4.0–5.6)

## 2020-05-23 MED ORDER — OMEPRAZOLE 20 MG PO CPDR: 20.0000 mg | DELAYED_RELEASE_CAPSULE | Freq: Every day | ORAL | 1 refills | Status: DC

## 2020-05-23 MED ORDER — TRAZODONE HCL 50 MG PO TABS: 25.0000 mg | ORAL_TABLET | Freq: Every evening | ORAL | 0 refills | Status: DC | PRN

## 2020-05-23 MED ORDER — AMLODIPINE BESYLATE 5 MG PO TABS: 5.0000 mg | ORAL_TABLET | Freq: Every day | ORAL | 1 refills | Status: DC

## 2020-05-23 MED ORDER — PIOGLITAZONE HCL 30 MG PO TABS: 30.0000 mg | ORAL_TABLET | Freq: Every day | ORAL | 1 refills | Status: DC

## 2020-05-23 MED ORDER — MELOXICAM 15 MG PO TABS
15.0000 mg | ORAL_TABLET | ORAL | 0 refills | Status: DC | PRN
Start: 2020-05-23 — End: 2020-10-10

## 2020-05-23 MED ORDER — ATENOLOL-CHLORTHALIDONE 50-25 MG PO TABS: 1.0000 | ORAL_TABLET | Freq: Every day | ORAL | 1 refills | Status: DC

## 2020-05-23 MED ORDER — ATORVASTATIN CALCIUM 40 MG PO TABS: 40.0000 mg | ORAL_TABLET | Freq: Every day | ORAL | 1 refills | Status: DC

## 2020-05-23 MED ORDER — METFORMIN HCL ER 750 MG PO TB24: 1500.0000 mg | ORAL_TABLET | Freq: Every day | ORAL | 1 refills | Status: DC

## 2020-05-23 MED ORDER — BENAZEPRIL HCL 40 MG PO TABS: 40.0000 mg | ORAL_TABLET | Freq: Every day | ORAL | 1 refills | Status: DC

## 2020-05-23 MED ORDER — GLIPIZIDE ER 2.5 MG PO TB24: 2.5000 mg | ORAL_TABLET | Freq: Every day | ORAL | 0 refills | Status: DC

## 2020-05-24 LAB — MICROALBUMIN / CREATININE URINE RATIO
Creatinine, Urine: 122 mg/dL (ref 20–275)
Microalb Creat Ratio: 19 mcg/mg creat (ref <30)
Microalb, Ur: 2.3 mg/dL

## 2020-08-10 ENCOUNTER — Other Ambulatory Visit: Payer: Self-pay | Admitting: Family Medicine

## 2020-08-10 DIAGNOSIS — E1121 Type 2 diabetes mellitus with diabetic nephropathy: Secondary | ICD-10-CM

## 2020-08-20 ENCOUNTER — Other Ambulatory Visit: Payer: Self-pay | Admitting: Family Medicine

## 2020-08-20 DIAGNOSIS — E1121 Type 2 diabetes mellitus with diabetic nephropathy: Secondary | ICD-10-CM

## 2020-08-27 ENCOUNTER — Ambulatory Visit: Payer: Medicare HMO | Admitting: Family Medicine

## 2020-10-09 NOTE — Progress Notes (Signed)
Name: Michelle Montgomery   MRN: [id number]    DOB: 1950/09/07   Date:10/10/2020       Progress Note  Subjective  Chief Complaint  Follow Up  HPI  DMII with nephropathy: A1C spiked to  9.3 % April 2021 , she was able to changed her diet, was more compliant with metformin and pioglitazone and low dose Glipizide.  and A1C has gone down from 7.9% to 6.9 % , however she states not as compliant with diet over the past month and has gained some weight.  She is on statin therapy  for dyslipidemia and also on ACE for microalbuminuria, HTN is under control    No polyphagia but always feels thirsty and has polyuria.     HTN: BP is at goal, taking Tenoretic, Norvasc and Benazepril ,  she denies chest pain, palpitation, edema, dizziness  or decrease in exercise tolerance.    Hyperlipidemia: taking Atorvastatin, she denies myalgia. Reviewed last labs, LDL at 53 , we will recheck today    Insomnia: she is now off Ambien, taking melatonin and Tylenol Pm- discussed risk of Tylenol pm , and sleeps well most of the time. She was given Trazodone on her last visit but one pill did not work, advised to try taking two at night and monitor    Lack of sense of smell and taste: symptoms started a couple of years ago She still can't smell dirty diapers, skunk smell or her dogs bad breath. She saw ENT and he suggested MRI brain but she decided to hold off  . She states mother, maternal grandmother, maternal aunt and cousin have the same problems   GERD: taking Omeprazole, and no heartburn or regurgitation noticed.  She is doing well, she states when she skips medication for 3 days symptoms returns. Unchanged   OA: she has noticed worsening of knee pain, also instability, she is seeing Dr. Marry Guan and he advised to have bilateral knee replacement. She states had to take Meloxicam more often while on vacation at the beach and it really improved the pain, she needs refills today. She is planning on having surgery early next  year. Pain is described as aching pain worse when standing, not as bad when walking, and not pain during rest . No redness but has effusion    Osteopenia: FRAX 9.3% of major fracture and 1.2% of hip fracture, continue vitamin D also calcium. Advised to get another study done since it was done in 2019 , it was ordered and she had not scheduled it yet   Patient Active Problem List   Diagnosis Date Noted   Varicose veins of both lower extremities 06/12/2016   BPV (benign positional vertigo), right 08/24/2015   Smell disturbance 01/16/2015   Osteoarthritis 11/03/2014   Cramps of lower extremity 11/03/2014   Neuralgia neuritis, sciatic nerve 11/03/2014   Insomnia, persistent 11/03/2014   Dyslipidemia 11/03/2014   Type 2 diabetes with nephropathy ([place]) 11/03/2014   Essential (primary) hypertension 11/03/2014   History of colon polyps 11/03/2014   H/O malignant neoplasm of skin 11/03/2014   Microalbuminuria 11/03/2014   Osteopenia 11/03/2014   Plantar fasciitis 11/03/2014   Gastro-esophageal reflux disease without esophagitis 11/03/2014   Bundle branch block, right 11/03/2014    Past Surgical History:  Procedure Laterality Date   BREAST BIOPSY Left 2015   benign   FOOT SURGERY Left 08/29/2011   Spur Removal , and Achilles Tendon Tendolysis   VARICOSE VEIN SURGERY Bilateral     Family  History  Problem Relation Age of Onset   Cancer Mother        Colon and breast   Anemia Mother    Hypertension Mother    Breast cancer Mother    Cancer Father        Esophageal   Heart disease Father    Diabetes Brother    Cancer Maternal Uncle        Colon   Cancer Maternal Grandmother        Colon   Healthy Brother    Melanoma Brother     Social History   Tobacco Use   Smoking status: Former    Packs/day: 0.25    Years: 2.00    Pack years: 0.50    Types: Cigarettes    Quit date: 02/10/1970    Years since quitting: 50.6   Smokeless tobacco: Never   Tobacco comments:    smoking  cessation materials not required  Substance Use Topics   Alcohol use: No    Alcohol/week: 0.0 standard drinks     Current Outpatient Medications:    amLODipine (NORVASC) 5 MG tablet, Take 1 tablet (5 mg total) by mouth daily., Disp: 90 tablet, Rfl: 1   aspirin 81 MG tablet, Take 1 tablet by mouth daily., Disp: , Rfl:    atenolol-chlorthalidone (TENORETIC) 50-25 MG tablet, Take 1 tablet by mouth daily., Disp: 90 tablet, Rfl: 1   atorvastatin (LIPITOR) 40 MG tablet, Take 1 tablet (40 mg total) by mouth daily., Disp: 90 tablet, Rfl: 1   benazepril (LOTENSIN) 40 MG tablet, Take 1 tablet (40 mg total) by mouth daily., Disp: 90 tablet, Rfl: 1   CALCIUM CARBONATE-VIT D-MIN PO, Take 2 tablets by mouth daily. 600 mg bid, Disp: , Rfl:    Cholecalciferol (VITAMIN D) 2000 UNITS CAPS, Take 1 tablet by mouth daily., Disp: , Rfl:    Coenzyme Q10 (COQ-10) 150 MG CAPS, Take 2 tablets by mouth daily. , Disp: , Rfl:    glipiZIDE (GLUCOTROL XL) 2.5 MG 24 hr tablet, TAKE 1 TABLET DAILY WITH   BREAKFAST, Disp: 90 tablet, Rfl: 0   Melatonin 5 MG TABS, Take 1 tablet by mouth at bedtime., Disp: , Rfl:    meloxicam (MOBIC) 15 MG tablet, Take 1 tablet (15 mg total) by mouth as needed., Disp: 90 tablet, Rfl: 0   metFORMIN (GLUCOPHAGE-XR) 750 MG 24 hr tablet, Take 2 tablets (1,500 mg total) by mouth daily with breakfast., Disp: 180 tablet, Rfl: 1   Omega-3 Fatty Acids (FISH OIL CONCENTRATE) 1000 MG CAPS, Take 1 tablet by mouth daily. , Disp: , Rfl:    omeprazole (PRILOSEC) 20 MG capsule, Take 1 capsule (20 mg total) by mouth daily., Disp: 90 capsule, Rfl: 1   pioglitazone (ACTOS) 30 MG tablet, Take 1 tablet (30 mg total) by mouth daily., Disp: 90 tablet, Rfl: 1   psyllium (METAMUCIL) 0.52 g capsule, Take 1 capsule (0.52 g total) by mouth daily., Disp: 90 capsule, Rfl: 0   traZODone (DESYREL) 50 MG tablet, Take 0.5-1 tablets (25-50 mg total) by mouth at bedtime as needed for sleep., Disp: 30 tablet, Rfl: 0    triamcinolone (NASACORT AQ) 55 MCG/ACT AERO nasal inhaler, Place 2 sprays into the nose daily. (Patient taking differently: Place 2 sprays into the nose as needed.), Disp: 1 Inhaler, Rfl: 0   Turmeric 500 MG CAPS, Take 2 capsules by mouth daily., Disp: , Rfl:    UNABLE TO FIND, Doterra Onguard and Doterra Copaiba capsules for immunity  and joint pain, Disp: , Rfl:   Allergies  Allergen Reactions   Codeine    Penicillins     I personally reviewed active problem list, medication list, allergies, family history, social history, health maintenance with the patient/caregiver today.   ROS  Constitutional: Negative for fever or weight change.  Respiratory: Negative for cough and shortness of breath.   Cardiovascular: Negative for chest pain or palpitations.  Gastrointestinal: Negative for abdominal pain, no bowel changes.  Musculoskeletal: Negative for gait problem or joint swelling.  Skin: positive  for excoriation of left leg  Neurological: Negative for dizziness or headache.  No other specific complaints in a complete review of systems (except as listed in HPI above).   Objective  Vitals:   10/10/20 0846  BP: 134/80  Pulse: 70  Resp: 16  Temp: 98 F (36.7 C)  SpO2: 96%  Weight: 242 lb (109.8 kg)  Height: '5\' 10"'$  (1.778 m)    Body mass index is 34.72 kg/m.  Physical Exam  Constitutional: Patient appears well-developed and well-nourished. Obese  No distress.  HEENT: head atraumatic, normocephalic, pupils equal and reactive to light, neck supple Cardiovascular: Normal rate, regular rhythm and normal heart sounds.  No murmur heard. No BLE edema. Pulmonary/Chest: Effort normal and breath sounds normal. No respiratory distress. Abdominal: Soft.  There is no tenderness. Skin: AK on arms, left lower leg small area of excoriation she is due for Tdap Psychiatric: Patient has a normal mood and affect. behavior is normal. Judgment and thought content normal.   Recent Results (from  the past 2160 hour(s))  POCT HgB A1C     Status: Abnormal   Collection Time: 10/10/20  8:51 AM  Result Value Ref Range   Hemoglobin A1C 6.9 (A) 4.0 - 5.6 %   HbA1c POC (<> result, manual entry)     HbA1c, POC (prediabetic range)     HbA1c, POC (controlled diabetic range)       PHQ2/9: Depression screen [facility] 2/9 10/10/2020 05/23/2020 12/13/2019 12/12/2019 05/19/2019  Decreased Interest 0 0 0 0 1  Down, Depressed, Hopeless 0 0 0 0 0  PHQ - 2 Score 0 0 0 0 1  Altered sleeping - - - - 1  Tired, decreased energy - - - - 1  Change in appetite - - - - 2  Feeling bad or failure about yourself  - - - - 0  Trouble concentrating - - - - 0  Moving slowly or fidgety/restless - - - - 0  Suicidal thoughts - - - - 0  PHQ-9 Score - - - - 5  Difficult doing work/chores - - - - Not difficult at all    phq 9 is negative   Fall Risk: Fall Risk  10/10/2020 05/23/2020 12/13/2019 12/12/2019 05/19/2019  Falls in the past year? 0 0 0 0 0  Number falls in past yr: 0 0 0 0 0  Injury with Fall? 0 0 0 0 0  Risk for fall due to : No Fall Risks - No Fall Risks - -  Risk for fall due to: Comment - - - - -  Follow up Falls prevention discussed - Falls prevention discussed - -      Functional Status Survey: Is the patient deaf or have difficulty hearing?: No Does the patient have difficulty seeing, even when wearing glasses/contacts?: No Does the patient have difficulty concentrating, remembering, or making decisions?: No Does the patient have difficulty walking or climbing stairs?: Yes Does the patient  have difficulty dressing or bathing?: No Does the patient have difficulty doing errands alone such as visiting a doctor's office or shopping?: No    Assessment & Plan  1. Type 2 diabetes with nephropathy (HCC)  - POCT HgB A1C - glipiZIDE (GLUCOTROL XL) 2.5 MG 24 hr tablet; Take 1 tablet (2.5 mg total) by mouth daily with breakfast.  Dispense: 90 tablet; Refill: 0 - metFORMIN (GLUCOPHAGE-XR) 750 MG 24 hr  tablet; Take 2 tablets (1,500 mg total) by mouth daily with breakfast.  Dispense: 180 tablet; Refill: 1 - pioglitazone (ACTOS) 30 MG tablet; Take 1 tablet (30 mg total) by mouth daily.  Dispense: 90 tablet; Refill: 1  2. Gastro-esophageal reflux disease without esophagitis  - omeprazole (PRILOSEC) 20 MG capsule; Take 1 capsule (20 mg total) by mouth daily.  Dispense: 90 capsule; Refill: 1  3. Dyslipidemia  - atorvastatin (LIPITOR) 40 MG tablet; Take 1 tablet (40 mg total) by mouth daily.  Dispense: 90 tablet; Refill: 1 - Lipid panel  4. Primary osteoarthritis involving multiple joints  - meloxicam (MOBIC) 15 MG tablet; Take 1 tablet (15 mg total) by mouth as needed.  Dispense: 90 tablet; Refill: 0  5. Essential (primary) hypertension  - atenolol-chlorthalidone (TENORETIC) 50-25 MG tablet; Take 1 tablet by mouth daily.  Dispense: 90 tablet; Refill: 1 - amLODipine (NORVASC) 5 MG tablet; Take 1 tablet (5 mg total) by mouth daily.  Dispense: 90 tablet; Refill: 1 - benazepril (LOTENSIN) 40 MG tablet; Take 1 tablet (40 mg total) by mouth daily.  Dispense: 90 tablet; Refill: 1  6. Microalbuminuria  - benazepril (LOTENSIN) 40 MG tablet; Take 1 tablet (40 mg total) by mouth daily.  Dispense: 90 tablet; Refill: 1  7. Osteopenia after menopause   8. Insomnia, persistent   9. Chronic pain of both knees  - meloxicam (MOBIC) 15 MG tablet; Take 1 tablet (15 mg total) by mouth as needed.  Dispense: 90 tablet; Refill: 0  10. Hypertension associated with type 2 diabetes mellitus (HCC)  - COMPLETE METABOLIC PANEL WITH GFR - CBC with Differential/Platelet  11. Excoriation of left lower leg, initial encounter  - Tdap vaccine greater than or equal to 7yo IM  12. Need for Tdap vaccination  - Tdap vaccine greater than or equal to 7yo IM

## 2020-10-10 ENCOUNTER — Other Ambulatory Visit: Payer: Self-pay

## 2020-10-10 ENCOUNTER — Ambulatory Visit (INDEPENDENT_AMBULATORY_CARE_PROVIDER_SITE_OTHER): Payer: Medicare HMO | Admitting: Family Medicine

## 2020-10-10 ENCOUNTER — Encounter: Payer: Self-pay | Admitting: Family Medicine

## 2020-10-10 VITALS — BP 134/80 | HR 70 | Temp 98.0°F | Resp 16 | Ht 70.0 in | Wt 242.0 lb

## 2020-10-10 DIAGNOSIS — G4700 Insomnia, unspecified: Secondary | ICD-10-CM

## 2020-10-10 DIAGNOSIS — R809 Proteinuria, unspecified: Secondary | ICD-10-CM | POA: Diagnosis not present

## 2020-10-10 DIAGNOSIS — I10 Essential (primary) hypertension: Secondary | ICD-10-CM | POA: Diagnosis not present

## 2020-10-10 DIAGNOSIS — K219 Gastro-esophageal reflux disease without esophagitis: Secondary | ICD-10-CM | POA: Diagnosis not present

## 2020-10-10 DIAGNOSIS — M25562 Pain in left knee: Secondary | ICD-10-CM

## 2020-10-10 DIAGNOSIS — M8949 Other hypertrophic osteoarthropathy, multiple sites: Secondary | ICD-10-CM

## 2020-10-10 DIAGNOSIS — Z23 Encounter for immunization: Secondary | ICD-10-CM

## 2020-10-10 DIAGNOSIS — I152 Hypertension secondary to endocrine disorders: Secondary | ICD-10-CM

## 2020-10-10 DIAGNOSIS — E785 Hyperlipidemia, unspecified: Secondary | ICD-10-CM | POA: Diagnosis not present

## 2020-10-10 DIAGNOSIS — S80812A Abrasion, left lower leg, initial encounter: Secondary | ICD-10-CM | POA: Diagnosis not present

## 2020-10-10 DIAGNOSIS — G8929 Other chronic pain: Secondary | ICD-10-CM

## 2020-10-10 DIAGNOSIS — E1121 Type 2 diabetes mellitus with diabetic nephropathy: Secondary | ICD-10-CM

## 2020-10-10 DIAGNOSIS — M25561 Pain in right knee: Secondary | ICD-10-CM

## 2020-10-10 DIAGNOSIS — E1159 Type 2 diabetes mellitus with other circulatory complications: Secondary | ICD-10-CM

## 2020-10-10 DIAGNOSIS — M8580 Other specified disorders of bone density and structure, unspecified site: Secondary | ICD-10-CM | POA: Diagnosis not present

## 2020-10-10 DIAGNOSIS — M159 Polyosteoarthritis, unspecified: Secondary | ICD-10-CM

## 2020-10-10 DIAGNOSIS — M150 Primary generalized (osteo)arthritis: Secondary | ICD-10-CM

## 2020-10-10 DIAGNOSIS — Z780 Asymptomatic menopausal state: Secondary | ICD-10-CM

## 2020-10-10 LAB — POCT GLYCOSYLATED HEMOGLOBIN (HGB A1C): Hemoglobin A1C: 6.9 % — AB (ref 4.0–5.6)

## 2020-10-10 MED ORDER — PIOGLITAZONE HCL 30 MG PO TABS: 30.0000 mg | ORAL_TABLET | Freq: Every day | ORAL | 1 refills | Status: DC

## 2020-10-10 MED ORDER — BENAZEPRIL HCL 40 MG PO TABS: 40.0000 mg | ORAL_TABLET | Freq: Every day | ORAL | 1 refills | Status: DC

## 2020-10-10 MED ORDER — GLIPIZIDE ER 2.5 MG PO TB24: 2.5000 mg | ORAL_TABLET | Freq: Every day | ORAL | 0 refills | Status: DC

## 2020-10-10 MED ORDER — OMEPRAZOLE 20 MG PO CPDR: 20.0000 mg | DELAYED_RELEASE_CAPSULE | Freq: Every day | ORAL | 1 refills | Status: DC

## 2020-10-10 MED ORDER — METFORMIN HCL ER 750 MG PO TB24: 1500.0000 mg | ORAL_TABLET | Freq: Every day | ORAL | 1 refills | Status: DC

## 2020-10-10 MED ORDER — ATENOLOL-CHLORTHALIDONE 50-25 MG PO TABS: 1.0000 | ORAL_TABLET | Freq: Every day | ORAL | 1 refills | Status: DC

## 2020-10-10 MED ORDER — ATORVASTATIN CALCIUM 40 MG PO TABS: 40.0000 mg | ORAL_TABLET | Freq: Every day | ORAL | 1 refills | Status: DC

## 2020-10-10 MED ORDER — MELOXICAM 15 MG PO TABS: 15.0000 mg | ORAL_TABLET | ORAL | 0 refills | Status: DC | PRN

## 2020-10-10 MED ORDER — AMLODIPINE BESYLATE 5 MG PO TABS: 5.0000 mg | ORAL_TABLET | Freq: Every day | ORAL | 1 refills | Status: DC

## 2020-10-11 LAB — CBC WITH DIFFERENTIAL/PLATELET
Absolute Monocytes: 583 cells/uL (ref 200–950)
Basophils Absolute: 29 cells/uL (ref 0–200)
Basophils Relative: 0.6 %
Eosinophils Absolute: 181 cells/uL (ref 15–500)
Eosinophils Relative: 3.7 %
HCT: 39.4 % (ref 35.0–45.0)
Hemoglobin: 13.1 g/dL (ref 11.7–15.5)
Lymphs Abs: 1362 cells/uL (ref 850–3900)
MCH: 31.3 pg (ref 27.0–33.0)
MCHC: 33.2 g/dL (ref 32.0–36.0)
MCV: 94.3 fL (ref 80.0–100.0)
MPV: 8.5 fL (ref 7.5–12.5)
Monocytes Relative: 11.9 %
Neutro Abs: 2744 cells/uL (ref 1500–7800)
Neutrophils Relative %: 56 %
Platelets: 204 10*3/uL (ref 140–400)
RBC: 4.18 10*6/uL (ref 3.80–5.10)
RDW: 13.1 % (ref 11.0–15.0)
Total Lymphocyte: 27.8 %
WBC: 4.9 10*3/uL (ref 3.8–10.8)

## 2020-10-11 LAB — COMPLETE METABOLIC PANEL WITH GFR
AG Ratio: 2 (calc) (ref 1.0–2.5)
ALT: 21 U/L (ref 6–29)
AST: 17 U/L (ref 10–35)
Albumin: 4.5 g/dL (ref 3.6–5.1)
Alkaline phosphatase (APISO): 35 U/L — ABNORMAL LOW (ref 37–153)
BUN: 17 mg/dL (ref 7–25)
CO2: 32 mmol/L (ref 20–32)
Calcium: 9.7 mg/dL (ref 8.6–10.4)
Chloride: 99 mmol/L (ref 98–110)
Creat: 0.76 mg/dL (ref 0.60–1.00)
Globulin: 2.3 g/dL (calc) (ref 1.9–3.7)
Glucose, Bld: 131 mg/dL — ABNORMAL HIGH (ref 65–99)
Potassium: 4.3 mmol/L (ref 3.5–5.3)
Sodium: 140 mmol/L (ref 135–146)
Total Bilirubin: 0.5 mg/dL (ref 0.2–1.2)
Total Protein: 6.8 g/dL (ref 6.1–8.1)
eGFR: 84 mL/min/{1.73_m2} (ref >=60)

## 2020-10-11 LAB — LIPID PANEL
Cholesterol: 109 mg/dL (ref <200)
HDL: 37 mg/dL — ABNORMAL LOW (ref >=50)
LDL Cholesterol (Calc): 46 mg/dL (calc)
Non-HDL Cholesterol (Calc): 72 mg/dL (calc) (ref <130)
Total CHOL/HDL Ratio: 2.9 (calc) (ref <5.0)
Triglycerides: 182 mg/dL — ABNORMAL HIGH (ref <150)

## 2020-10-18 ENCOUNTER — Telehealth: Payer: Self-pay | Admitting: *Deleted

## 2020-10-18 NOTE — Chronic Care Management (AMB) (Signed)
  Chronic Care Management   Outreach Note  10/18/2020 Name: Michelle Montgomery MRN: [id number] DOB: [birth year]  [name] is a 70 y.o. year old female who is a primary care patient of Steele Sizer, MD. I reached out to [name] by phone today in response to a referral sent by Ms. Leonie Man Mikami's PCP Steele Sizer, MD     An unsuccessful telephone outreach was attempted today. The patient was referred to the case management team for assistance with care management and care coordination.   Follow Up Plan: A HIPAA compliant phone message was left for the patient providing contact information and requesting a return call.  If patient returns call to provider office, please advise to call Embedded Care Management Care Guide Kanda Deluna at [place] [place] [facility]  Direct Dial: [phone]

## 2020-10-24 ENCOUNTER — Other Ambulatory Visit: Payer: Self-pay | Admitting: Family Medicine

## 2020-10-24 DIAGNOSIS — E1121 Type 2 diabetes mellitus with diabetic nephropathy: Secondary | ICD-10-CM

## 2020-10-24 NOTE — Chronic Care Management (AMB) (Signed)
  Chronic Care Management   Outreach Note  10/24/2020 Name: Michelle Montgomery MRN: [id number] DOB: 1950-07-07  Michelle Montgomery is a 70 y.o. year old female who is a primary care patient of Steele Sizer, MD. I reached out to [name] by phone today in response to a referral sent by [name]'s PCP Steele Sizer, MD     A second unsuccessful telephone outreach was attempted today. The patient was referred to the case management team for assistance with care management and care coordination.   Follow Up Plan: A HIPAA compliant phone message was left for the patient providing contact information and requesting a return call.  If patient returns call to provider office, please advise to call Embedded Care Management Care Guide [name] at [place], [place] [facility]  Direct Dial: [phone]

## 2020-10-25 DIAGNOSIS — L820 Inflamed seborrheic keratosis: Secondary | ICD-10-CM | POA: Diagnosis not present

## 2020-10-25 DIAGNOSIS — L570 Actinic keratosis: Secondary | ICD-10-CM | POA: Diagnosis not present

## 2020-10-25 DIAGNOSIS — Z85828 Personal history of other malignant neoplasm of skin: Secondary | ICD-10-CM | POA: Diagnosis not present

## 2020-10-25 DIAGNOSIS — L814 Other melanin hyperpigmentation: Secondary | ICD-10-CM | POA: Diagnosis not present

## 2020-10-25 DIAGNOSIS — L821 Other seborrheic keratosis: Secondary | ICD-10-CM | POA: Diagnosis not present

## 2020-10-29 NOTE — Chronic Care Management (AMB) (Signed)
  Chronic Care Management   Outreach Note  10/29/2020 Name: Michelle Montgomery MRN: [id number] DOB: 1951-01-20  Michelle Montgomery is a 70 y.o. year old female who is a primary care patient of Steele Sizer, MD. I reached out to Michelle Montgomery by phone today in response to a referral sent by [name]'s PCP Steele Sizer, MD     Third unsuccessful telephone outreach was attempted today. The care management team is pleased to engage with this patient at any time in the future should he/she be interested in assistance from the care management team.   Follow Up Plan: We have been unable to make contact with the patient for follow up. The care management team is available to follow up with the patient after provider conversation with the patient regarding recommendation for care management engagement.   Julian Hy, [place] [facility]  Direct Dial: [phone]

## 2020-11-05 NOTE — Telephone Encounter (Signed)
Called pharmacy not sure why it was sent, they did say same dose?

## 2020-12-13 ENCOUNTER — Ambulatory Visit: Payer: Medicare HMO

## 2021-01-10 ENCOUNTER — Ambulatory Visit (INDEPENDENT_AMBULATORY_CARE_PROVIDER_SITE_OTHER): Payer: Medicare HMO

## 2021-01-10 DIAGNOSIS — E113393 Type 2 diabetes mellitus with moderate nonproliferative diabetic retinopathy without macular edema, bilateral: Secondary | ICD-10-CM | POA: Diagnosis not present

## 2021-01-10 DIAGNOSIS — Z0000 Encounter for general adult medical examination without abnormal findings: Secondary | ICD-10-CM

## 2021-01-10 DIAGNOSIS — H524 Presbyopia: Secondary | ICD-10-CM | POA: Diagnosis not present

## 2021-01-10 LAB — HM DIABETES EYE EXAM

## 2021-01-10 NOTE — Progress Notes (Signed)
Subjective:   Michelle Montgomery is a 70 y.o. female who presents for Medicare Annual (Subsequent) preventive examination.  Virtual Visit via Telephone Note  I connected with  Michelle Montgomery on 01/10/21 at 11:20 AM EST by telephone and verified that I am speaking with the correct person using two identifiers.  Location: Patient: home Provider: [place] Persons participating in the virtual visit: Michelle Montgomery   I discussed the limitations, risks, security and privacy concerns of performing an evaluation and management service by telephone and the availability of in person appointments. The patient expressed understanding and agreed to proceed.  Interactive audio and video telecommunications were attempted between this nurse and patient, however failed, due to patient having technical difficulties OR patient did not have access to video capability.  We continued and completed visit with audio only.  Some vital signs may be absent or patient reported.   Michelle Marker, LPN   Review of Systems     Cardiac Risk Factors include: advanced age (>51men, >45 women);diabetes mellitus;hypertension;dyslipidemia;obesity (BMI >30kg/m2)     Objective:    Today's Vitals   01/10/21 1120  PainSc: 8    There is no height or weight on file to calculate BMI.  Advanced Directives 01/10/2021 12/13/2019 08/24/2018 08/20/2017 10/29/2016 06/12/2016 02/12/2016  Does Patient Have a Medical Advance Directive? Yes Yes Yes Yes Yes No No;Yes  Type of Advance Directive Living will Living will Living will [place];Living will - - Living will  Does patient want to make changes to medical advance directive? - Yes (MAU/Ambulatory/Procedural Areas - Information given) - - - - -  Copy of Healthcare Power of Attorney in Chart? - - - No - copy requested - - -  Would patient like information on creating a medical advance directive? - - - - - - -    Current Medications  (verified) Outpatient Encounter Medications as of 01/10/2021  Medication Sig   amLODipine (NORVASC) 5 MG tablet Take 1 tablet (5 mg total) by mouth daily.   aspirin 81 MG tablet Take 1 tablet by mouth daily.   atenolol-chlorthalidone (TENORETIC) 50-25 MG tablet Take 1 tablet by mouth daily.   atorvastatin (LIPITOR) 40 MG tablet Take 1 tablet (40 mg total) by mouth daily.   benazepril (LOTENSIN) 40 MG tablet Take 1 tablet (40 mg total) by mouth daily.   CALCIUM CARBONATE-VIT D-MIN PO Take 2 tablets by mouth daily. 600 mg bid   Cholecalciferol (VITAMIN D) 2000 UNITS CAPS Take 1 tablet by mouth daily.   Coenzyme Q10 (COQ-10) 150 MG CAPS Take 2 tablets by mouth daily.    glipiZIDE (GLUCOTROL XL) 2.5 MG 24 hr tablet Take 1 tablet (2.5 mg total) by mouth daily with breakfast.   Melatonin 5 MG TABS Take 1 tablet by mouth at bedtime.   meloxicam (MOBIC) 15 MG tablet Take 1 tablet (15 mg total) by mouth as needed.   metFORMIN (GLUCOPHAGE-XR) 750 MG 24 hr tablet Take 2 tablets (1,500 mg total) by mouth daily with breakfast.   Multiple Vitamin (MULTIVITAMIN ADULT PO) Take by mouth. Feel good vitafruits and veggies   Omega-3 Fatty Acids (FISH OIL CONCENTRATE) 1000 MG CAPS Take 1 tablet by mouth daily.    omeprazole (PRILOSEC) 20 MG capsule Take 1 capsule (20 mg total) by mouth daily.   pioglitazone (ACTOS) 30 MG tablet Take 1 tablet (30 mg total) by mouth daily.   psyllium (METAMUCIL) 0.52 g capsule Take 1 capsule (0.52 g total) by mouth  daily.   traZODone (DESYREL) 50 MG tablet Take 0.5-1 tablets (25-50 mg total) by mouth at bedtime as needed for sleep.   triamcinolone (NASACORT AQ) 55 MCG/ACT AERO nasal inhaler Place 2 sprays into the nose daily. (Patient taking differently: Place 2 sprays into the nose as needed.)   Turmeric 500 MG CAPS Take 2 capsules by mouth daily.   UNABLE TO FIND Doterra Onguard and Doterra Copaiba capsules for immunity and joint pain   No facility-administered encounter  medications on file as of 01/10/2021.    Allergies (verified) Codeine and Penicillins   History: Past Medical History:  Diagnosis Date   Arthrosis    Bilateral leg cramps    Bilateral sciatica    Chronic insomnia    Diabetes mellitus without complication (HCC)    Dyslipidemia    History of colon polyps    History of skin cancer    Dr. Ave Filter, history of basal cell and squamous cells carcinoma   Hypertension    Microalbuminuria    100-DM   Obesity    Plantar fasciitis, left    Dr. Milinda Pointer   Reflux    Right bundle branch block    Past Surgical History:  Procedure Laterality Date   BREAST BIOPSY Left 2015   benign   FOOT SURGERY Left 08/29/2011   Spur Removal , and Achilles Tendon Tendolysis   VARICOSE VEIN SURGERY Bilateral    Family History  Problem Relation Age of Onset   Cancer Mother        Colon and breast   Anemia Mother    Hypertension Mother    Breast cancer Mother    Cancer Father        Esophageal   Heart disease Father    Diabetes Brother    Cancer Maternal Uncle        Colon   Cancer Maternal Grandmother        Colon   Healthy Brother    Melanoma Brother    Social History   Socioeconomic History   Marital status: Married    Spouse name: Michelle Montgomery   Number of children: 2   Years of education: Not on file   Highest education level: 12th grade  Occupational History   Occupation: Retired  Tobacco Use   Smoking status: Former    Packs/day: 0.25    Years: 2.00    Pack years: 0.50    Types: Cigarettes    Quit date: 02/10/1970    Years since quitting: 50.9   Smokeless tobacco: Never   Tobacco comments:    smoking cessation materials not required  Vaping Use   Vaping Use: Never used  Substance and Sexual Activity   Alcohol use: No    Alcohol/week: 0.0 standard drinks   Drug use: No   Sexual activity: Yes  Other Topics Concern   Not on file  Social History Narrative   Not on file   Social Determinants of Health   Financial  Resource Strain: Low Risk    Difficulty of Paying Living Expenses: Not hard at all  Food Insecurity: No Food Insecurity   Worried About Charity fundraiser in the Last Year: Never true   [place] in the Last Year: Never true  Transportation Needs: No Transportation Needs   Lack of Transportation (Medical): No   Lack of Transportation (Non-Medical): No  Physical Activity: Inactive   Days of Exercise per Week: 0 days   Minutes of Exercise per Session: 0  min  Stress: No Stress Concern Present   Feeling of Stress : Not at all  Social Connections: Moderately Integrated   Frequency of Communication with Friends and Family: More than three times a week   Frequency of Social Gatherings with Friends and Family: More than three times a week   Attends Religious Services: More than 4 times per year   Active Member of [employer] or Organizations: No   Attends Music therapist: Never   Marital Status: Married    Tobacco Counseling Counseling given: Not Answered Tobacco comments: smoking cessation materials not required   Clinical Intake:  Pre-visit preparation completed: Yes  Pain : 0-10 Pain Score: 8  Pain Type: Chronic pain Pain Location: Knee Pain Orientation: Right, Left Pain Descriptors / Indicators: Aching, Sore Pain Onset: More than a month ago Pain Frequency: Constant     Nutritional Status: BMI > 30  Obese Nutritional Risks: None Diabetes: Yes CBG done?: No Did pt. bring in CBG monitor from home?: No  How often do you need to have someone help you when you read instructions, pamphlets, or other written materials from your doctor or pharmacy?: 1 - Never  Nutrition Risk Assessment:  Has the patient had any N/V/D within the last 2 months?  No  Does the patient have any non-healing wounds?  No  Has the patient had any unintentional weight loss or weight gain?  No   Diabetes:  Is the patient diabetic?  Yes  If diabetic, was a CBG obtained today?  No   Did the patient bring in their glucometer from home?  No  How often do you monitor your CBG's? Daily in AM.   Financial Strains and Diabetes Management:  Are you having any financial strains with the device, your supplies or your medication? No .  Does the patient want to be seen by Chronic Care Management for management of their diabetes?  No  Would the patient like to be referred to a Nutritionist or for Diabetic Management?  No   Diabetic Exams:  Diabetic Eye Exam: Completed 08/24/18. Scheduled with Dr. Gloriann Loan for 01/10/21 at 3:30  Diabetic Foot Exam: Completed 12/12/19. Pt has been advised about the importance in completing this exam. Pt is scheduled for diabetic foot exam on 02/18/21   Interpreter Needed?: No  Information entered by :: Michelle Marker LPN   Activities of Daily Living In your present state of health, do you have any difficulty performing the following activities: 01/10/2021 10/10/2020  Hearing? N N  Vision? N N  Difficulty concentrating or making decisions? N N  Walking or climbing stairs? Y Y  Dressing or bathing? N N  Doing errands, shopping? N N  Preparing Food and eating ? N -  Using the Toilet? N -  In the past six months, have you accidently leaked urine? Y -  Comment wears pads for protection occasionally -  Do you have problems with loss of bowel control? N -  Managing your Medications? N -  Managing your Finances? N -  Housekeeping or managing your Housekeeping? N -  Some recent data might be hidden    Patient Care Team: Steele Sizer, MD as PCP - General (Family Medicine) Rubie Maid, MD as Consulting Physician (Obstetrics and Gynecology) Christene Slates, MD as Consulting Physician (Dermatology) Marry Guan, Laurice Record, MD (Orthopedic Surgery)  Indicate any recent Medical Services you may have received from other than [facility] providers in the past year (date may be approximate).  Assessment:   This is a routine wellness examination for  Michelle Montgomery.  Hearing/Vision screen Hearing Screening - Comments:: Pt denies hearing difficulty  Vision Screening - Comments:: Annual vision screenings done by Dr. Gloriann Loan.   Dietary issues and exercise activities discussed: Current Exercise Habits: The patient does not participate in regular exercise at present, Exercise limited by: orthopedic condition(s)   Goals Addressed             This Visit's Progress    DIET - INCREASE WATER INTAKE   On track    Recommend to drink at least 6-8 8oz glasses of water per day.       Depression Screen PHQ 2/9 Scores 01/10/2021 10/10/2020 05/23/2020 12/13/2019 12/12/2019 05/19/2019 12/29/2018  PHQ - 2 Score 0 0 0 0 0 1 0  PHQ- 9 Score - - - - - 5 0    Fall Risk Fall Risk  01/10/2021 10/10/2020 05/23/2020 12/13/2019 12/12/2019  Falls in the past year? 0 0 0 0 0  Number falls in past yr: 0 0 0 0 0  Injury with Fall? 0 0 0 0 0  Risk for fall due to : No Fall Risks No Fall Risks - No Fall Risks -  Risk for fall due to: Comment - - - - -  Follow up Falls prevention discussed Falls prevention discussed - Falls prevention discussed -    FALL RISK PREVENTION PERTAINING TO THE HOME:  Any stairs in or around the home? Yes  If so, are there any without handrails? No  Home free of loose throw rugs in walkways, pet beds, electrical cords, etc? Yes  Adequate lighting in your home to reduce risk of falls? Yes   ASSISTIVE DEVICES UTILIZED TO PREVENT FALLS:  Life alert? No  Use of a cane, walker or w/c? No  Grab bars in the bathroom? No  Shower chair or bench in shower? Yes  Elevated toilet seat or a handicapped toilet? Yes   TIMED UP AND GO:  Was the test performed? No . Telephonic visit.   Cognitive Function: Normal cognitive status assessed by direct observation by this Nurse Health Advisor. No abnormalities found.       6CIT Screen 08/24/2018 08/20/2017  What Year? 0 points 0 points  What month? 0 points 0 points  What time? 0 points 0 points  Count  back from 20 0 points 0 points  Months in reverse 0 points 0 points  Repeat phrase 0 points 2 points  Total Score 0 2    Immunizations Immunization History  Administered Date(s) Administered   Influenza, High Dose Seasonal PF 11/06/2014, 10/29/2016, 10/30/2018, 11/02/2019, 12/07/2020   Influenza,inj,Quad PF,6+ Mos 11/06/2014   Influenza-Unspecified 11/09/2013, 12/13/2015, 12/08/2017   PFIZER(Purple Top)SARS-COV-2 Vaccination 03/19/2019, 04/09/2019, 11/08/2019   Pfizer Covid-19 Vaccine Bivalent Booster 61yrs & up 11/06/2020   Pneumococcal Conjugate-13 02/28/2014   Pneumococcal Polysaccharide-23 02/21/2010, 04/10/2015   Tdap 01/07/2010, 10/10/2020   Zoster Recombinat (Shingrix) 09/18/2017, 01/19/2018   Zoster, Live 10/27/2011    TDAP status: Up to date  Flu Vaccine status: Up to date  Pneumococcal vaccine status: Up to date  Covid-19 vaccine status: Completed vaccines  Qualifies for Shingles Vaccine? Yes   Zostavax completed Yes   Shingrix Completed?: Yes  Screening Tests Health Maintenance  Topic Date Due   OPHTHALMOLOGY EXAM  08/24/2019   MAMMOGRAM  05/29/2020   FOOT EXAM  12/11/2020   HEMOGLOBIN A1C  04/09/2021   COLONOSCOPY (Pts 45-60yrs Insurance coverage will need to be confirmed)  10/28/2022   TETANUS/TDAP  10/11/2030   Pneumonia Vaccine 39+ Years old  Completed   INFLUENZA VACCINE  Completed   DEXA SCAN  Completed   COVID-19 Vaccine  Completed   Hepatitis C Screening  Completed   Zoster Vaccines- Shingrix  Completed   HPV VACCINES  Aged Out    Health Maintenance  Health Maintenance Due  Topic Date Due   OPHTHALMOLOGY EXAM  08/24/2019   MAMMOGRAM  05/29/2020   FOOT EXAM  12/11/2020    Colorectal cancer screening: Type of screening: Colonoscopy. Completed 10/27/17. Repeat every 5 years  Mammogram status: Completed 05/30/19. Repeat every year  Bone Density status: Completed 09/15/17. Results reflect: Bone density results: OSTEOPENIA. Repeat every 2  years.  Lung Cancer Screening: (Low Dose CT Chest recommended if Age 54-80 years, 30 pack-year currently smoking OR have quit w/in 15years.) does not qualify.   Additional Screening:  Hepatitis C Screening: does qualify; Completed 10/27/11  Vision Screening: Recommended annual ophthalmology exams for early detection of glaucoma and other disorders of the eye. Is the patient up to date with their annual eye exam?  Yes  Who is the provider or what is the name of the office in which the patient attends annual eye exams? Dr. Gloriann Loan.   Dental Screening: Recommended annual dental exams for proper oral hygiene  Community Resource Referral / Chronic Care Management: CRR required this visit?  No   CCM required this visit?  No      Plan:     I have personally reviewed and noted the following in the patient's chart:   Medical and social history Use of alcohol, tobacco or illicit drugs  Current medications and supplements including opioid prescriptions.  Functional ability and status Nutritional status Physical activity Advanced directives List of other physicians Hospitalizations, surgeries, and ER visits in previous 12 months Vitals Screenings to include cognitive, depression, and falls Referrals and appointments  In addition, I have reviewed and discussed with patient certain preventive protocols, quality metrics, and best practice recommendations. A written personalized care plan for preventive services as well as general preventive health recommendations were provided to patient.     Michelle Marker, LPN   [id number]   Nurse Notes: none

## 2021-01-10 NOTE — Patient Instructions (Signed)
Michelle Montgomery , Thank you for taking time to come for your Medicare Wellness Visit. I appreciate your ongoing commitment to your health goals. Please review the following plan we discussed and let me know if I can assist you in the future.   Screening recommendations/referrals: Colonoscopy: 917/19. Repeat in 10/2022 Mammogram: done 05/30/19. Please call [phone] to schedule your mammogram and bone density screening.  Bone Density: done 09/15/17 Recommended yearly ophthalmology/optometry visit for glaucoma screening and checkup Recommended yearly dental visit for hygiene and checkup  Vaccinations: Influenza vaccine: done 12/07/20 Pneumococcal vaccine: done 04/10/15 Tdap vaccine: done 10/10/20 Shingles vaccine: done 09/18/17 & 01/19/18   Covid-19:done 03/19/19, 04/09/19, 11/08/19 & 11/06/20  Advanced directives: Please bring a copy of your health care power of attorney and living will to the office at your convenience.   Conditions/risks identified: Recommend increasing physical activity as tolerated  Next appointment: Follow up in one year for your annual wellness visit    Preventive Care 65 Years and Older, Female Preventive care refers to lifestyle choices and visits with your health care provider that can promote health and wellness. What does preventive care include? A yearly physical exam. This is also called an annual well check. Dental exams once or twice a year. Routine eye exams. Ask your health care provider how often you should have your eyes checked. Personal lifestyle choices, including: Daily care of your teeth and gums. Regular physical activity. Eating a healthy diet. Avoiding tobacco and drug use. Limiting alcohol use. Practicing safe sex. Taking low-dose aspirin every day. Taking vitamin and mineral supplements as recommended by your health care provider. What happens during an annual well check? The services and screenings done by your health care provider during your  annual well check will depend on your age, overall health, lifestyle risk factors, and family history of disease. Counseling  Your health care provider may ask you questions about your: Alcohol use. Tobacco use. Drug use. Emotional well-being. Home and relationship well-being. Sexual activity. Eating habits. History of falls. Memory and ability to understand (cognition). Work and work Statistician. Reproductive health. Screening  You may have the following tests or measurements: Height, weight, and BMI. Blood pressure. Lipid and cholesterol levels. These may be checked every 5 years, or more frequently if you are over 25 years old. Skin check. Lung cancer screening. You may have this screening every year starting at age 69 if you have a 30-pack-year history of smoking and currently smoke or have quit within the past 15 years. Fecal occult blood test (FOBT) of the stool. You may have this test every year starting at age 40. Flexible sigmoidoscopy or colonoscopy. You may have a sigmoidoscopy every 5 years or a colonoscopy every 10 years starting at age 26. Hepatitis C blood test. Hepatitis B blood test. Sexually transmitted disease (STD) testing. Diabetes screening. This is done by checking your blood sugar (glucose) after you have not eaten for a while (fasting). You may have this done every 1-3 years. Bone density scan. This is done to screen for osteoporosis. You may have this done starting at age 36. Mammogram. This may be done every 1-2 years. Talk to your health care provider about how often you should have regular mammograms. Talk with your health care provider about your test results, treatment options, and if necessary, the need for more tests. Vaccines  Your health care provider may recommend certain vaccines, such as: Influenza vaccine. This is recommended every year. Tetanus, diphtheria, and acellular pertussis (Tdap, Td) vaccine.  You may need a Td booster every 10  years. Zoster vaccine. You may need this after age 64. Pneumococcal 13-valent conjugate (PCV13) vaccine. One dose is recommended after age 79. Pneumococcal polysaccharide (PPSV23) vaccine. One dose is recommended after age 30. Talk to your health care provider about which screenings and vaccines you need and how often you need them. This information is not intended to replace advice given to you by your health care provider. Make sure you discuss any questions you have with your health care provider. Document Released: 02/23/2015 Document Revised: 10/17/2015 Document Reviewed: 11/28/2014 Elsevier Interactive Patient Education  2017 [place] Prevention in the Home Falls can cause injuries. They can happen to people of all ages. There are many things you can do to make your home safe and to help prevent falls. What can I do on the outside of my home? Regularly fix the edges of walkways and driveways and fix any cracks. Remove anything that might make you trip as you walk through a door, such as a raised step or threshold. Trim any bushes or trees on the path to your home. Use bright outdoor lighting. Clear any walking paths of anything that might make someone trip, such as rocks or tools. Regularly check to see if handrails are loose or broken. Make sure that both sides of any steps have handrails. Any raised decks and porches should have guardrails on the edges. Have any leaves, snow, or ice cleared regularly. Use sand or salt on walking paths during winter. Clean up any spills in your garage right away. This includes oil or grease spills. What can I do in the bathroom? Use night lights. Install grab bars by the toilet and in the tub and shower. Do not use towel bars as grab bars. Use non-skid mats or decals in the tub or shower. If you need to sit down in the shower, use a plastic, non-slip stool. Keep the floor dry. Clean up any water that spills on the floor as soon as it  happens. Remove soap buildup in the tub or shower regularly. Attach bath mats securely with double-sided non-slip rug tape. Do not have throw rugs and other things on the floor that can make you trip. What can I do in the bedroom? Use night lights. Make sure that you have a light by your bed that is easy to reach. Do not use any sheets or blankets that are too big for your bed. They should not hang down onto the floor. Have a firm chair that has side arms. You can use this for support while you get dressed. Do not have throw rugs and other things on the floor that can make you trip. What can I do in the kitchen? Clean up any spills right away. Avoid walking on wet floors. Keep items that you use a lot in easy-to-reach places. If you need to reach something above you, use a strong step stool that has a grab bar. Keep electrical cords out of the way. Do not use floor polish or wax that makes floors slippery. If you must use wax, use non-skid floor wax. Do not have throw rugs and other things on the floor that can make you trip. What can I do with my stairs? Do not leave any items on the stairs. Make sure that there are handrails on both sides of the stairs and use them. Fix handrails that are broken or loose. Make sure that handrails are as long as  the stairways. Check any carpeting to make sure that it is firmly attached to the stairs. Fix any carpet that is loose or worn. Avoid having throw rugs at the top or bottom of the stairs. If you do have throw rugs, attach them to the floor with carpet tape. Make sure that you have a light switch at the top of the stairs and the bottom of the stairs. If you do not have them, ask someone to add them for you. What else can I do to help prevent falls? Wear shoes that: Do not have high heels. Have rubber bottoms. Are comfortable and fit you well. Are closed at the toe. Do not wear sandals. If you use a stepladder: Make sure that it is fully opened.  Do not climb a closed stepladder. Make sure that both sides of the stepladder are locked into place. Ask someone to hold it for you, if possible. Clearly mark and make sure that you can see: Any grab bars or handrails. First and last steps. Where the edge of each step is. Use tools that help you move around (mobility aids) if they are needed. These include: Canes. Walkers. Scooters. Crutches. Turn on the lights when you go into a dark area. Replace any light bulbs as soon as they burn out. Set up your furniture so you have a clear path. Avoid moving your furniture around. If any of your floors are uneven, fix them. If there are any pets around you, be aware of where they are. Review your medicines with your doctor. Some medicines can make you feel dizzy. This can increase your chance of falling. Ask your doctor what other things that you can do to help prevent falls. This information is not intended to replace advice given to you by your health care provider. Make sure you discuss any questions you have with your health care provider. Document Released: 11/23/2008 Document Revised: 07/05/2015 Document Reviewed: 03/03/2014 Elsevier Interactive Patient Education  2017 [employer].

## 2021-02-08 NOTE — Progress Notes (Signed)
Name: Michelle Montgomery   MRN: [id number]    DOB: 1950/05/19   Date:02/18/2021       Progress Note  Subjective  Chief Complaint  Follow Up  HPI  DMII with nephropathy: A1C spiked to  9.3 % April 2021 , she was able to changed her diet, was more compliant with metformin and pioglitazone and low dose Glipizide. Last A1C  had gone down from 7.9% to 6.9 % and today is up to 7.9 % again, she states she has been sick and eating fast food since husband does not cook   She is on statin therapy  for dyslipidemia and also on ACE for microalbuminuria, HTN is under control. No polyphagia but always feels thirsty and has polyuria.   URI: she states three weeks ago she developed a cough, followed by rhinorrhea, chest congestion. She states still not back to normal but feeling much better. COVID-19 test negative times 3 . She states appetite has improved, she is still feeling tired and has some sob with activity     HTN: BP is at goal, taking Tenoretic, Norvasc and Benazepril ,  she denies chest pain, palpitation, edema, dizziness. She has noticed some SOB with activity since recent URI    Hyperlipidemia: taking Atorvastatin, she denies myalgia. Reviewed last labs, LDL has been at goal    Insomnia: she is now off Ambien, taking melatonin and Tylenol Pm- discussed risk of Tylenol pm , and sleeps well most of the time. 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 She saw ENT and he suggested MRI brain but she decided to hold off  . She states mother, maternal grandmother, maternal aunt and cousin have the same problems Unchanged    GERD: taking Omeprazole, and no heartburn or regurgitation noticed.  She is doing well, she states when she skips medication for 3 days symptoms returns. Unchanged    OA: she is going to see Dr. Marry Guan decided to proceed with bilateral knee replacement    Osteopenia: FRAX 9.3% of major fracture and 1.2% of hip fracture, continue vitamin D also calcium. Advised to get another study done since it was done in 2019 , it was ordered but still not done, reminded her again importance of mammogram and bone density   Morbid obesity: BMI over 35 , discussed importance of losing weight prior to knee replacement surgery. She also has DM, HTN.   Patient Active Problem List   Diagnosis Date Noted   Varicose veins of both lower extremities 06/12/2016   BPV (benign positional vertigo), right 08/24/2015   Smell disturbance 01/16/2015   Osteoarthritis 11/03/2014   Cramps of lower extremity 11/03/2014   Neuralgia neuritis, sciatic nerve 11/03/2014   Insomnia, persistent 11/03/2014   Dyslipidemia 11/03/2014   Type 2 diabetes with nephropathy ([place]) 11/03/2014   Essential (primary) hypertension 11/03/2014   History of colon polyps 11/03/2014   H/O malignant neoplasm of skin 11/03/2014   Microalbuminuria 11/03/2014   Osteopenia 11/03/2014   Plantar fasciitis 11/03/2014   Gastro-esophageal reflux disease without esophagitis 11/03/2014   Bundle branch block, right 11/03/2014    Past Surgical History:  Procedure Laterality Date   BREAST BIOPSY Left 2015   benign   FOOT SURGERY Left 08/29/2011  Spur Removal , and Achilles Tendon Tendolysis   VARICOSE VEIN SURGERY Bilateral     Family History  Problem Relation Age of Onset   Cancer Mother        Colon and breast   Anemia Mother    Hypertension Mother    Breast cancer Mother    Cancer Father        Esophageal   Heart disease Father    Diabetes Brother    Cancer Maternal Uncle        Colon   Cancer Maternal Grandmother        Colon   Healthy Brother    Melanoma Brother     Social History   Tobacco Use   Smoking status: Former    Packs/day: 0.25    Years: 2.00    Pack years: 0.50    Types:  Cigarettes    Quit date: 02/10/1970    Years since quitting: 51.0   Smokeless tobacco: Never   Tobacco comments:    smoking cessation materials not required  Substance Use Topics   Alcohol use: No    Alcohol/week: 0.0 standard drinks     Current Outpatient Medications:    amLODipine (NORVASC) 5 MG tablet, Take 1 tablet (5 mg total) by mouth daily., Disp: 90 tablet, Rfl: 1   aspirin 81 MG tablet, Take 1 tablet by mouth daily., Disp: , Rfl:    atenolol-chlorthalidone (TENORETIC) 50-25 MG tablet, Take 1 tablet by mouth daily., Disp: 90 tablet, Rfl: 1   atorvastatin (LIPITOR) 40 MG tablet, Take 1 tablet (40 mg total) by mouth daily., Disp: 90 tablet, Rfl: 1   benazepril (LOTENSIN) 40 MG tablet, Take 1 tablet (40 mg total) by mouth daily., Disp: 90 tablet, Rfl: 1   CALCIUM CARBONATE-VIT D-MIN PO, Take 2 tablets by mouth daily. 600 mg bid, Disp: , Rfl:    Cholecalciferol (VITAMIN D) 2000 UNITS CAPS, Take 1 tablet by mouth daily., Disp: , Rfl:    Coenzyme Q10 (COQ-10) 150 MG CAPS, Take 2 tablets by mouth daily. , Disp: , Rfl:    glipiZIDE (GLUCOTROL XL) 2.5 MG 24 hr tablet, Take 1 tablet (2.5 mg total) by mouth daily with breakfast., Disp: 90 tablet, Rfl: 0   Melatonin 5 MG TABS, Take 1 tablet by mouth at bedtime., Disp: , Rfl:    meloxicam (MOBIC) 15 MG tablet, Take 1 tablet (15 mg total) by mouth as needed., Disp: 90 tablet, Rfl: 0   metFORMIN (GLUCOPHAGE-XR) 750 MG 24 hr tablet, Take 2 tablets (1,500 mg total) by mouth daily with breakfast., Disp: 180 tablet, Rfl: 1   Multiple Vitamin (MULTIVITAMIN ADULT PO), Take by mouth. Feel good vitafruits and veggies, Disp: , Rfl:    Omega-3 Fatty Acids (FISH OIL CONCENTRATE) 1000 MG CAPS, Take 1 tablet by mouth daily. , Disp: , Rfl:    omeprazole (PRILOSEC) 20 MG capsule, Take 1 capsule (20 mg total) by mouth daily., Disp: 90 capsule, Rfl: 1   pioglitazone (ACTOS) 30 MG tablet, Take 1 tablet (30 mg total) by mouth daily., Disp: 90 tablet, Rfl: 1    psyllium (METAMUCIL) 0.52 g capsule, Take 1 capsule (0.52 g total) by mouth daily., Disp: 90 capsule, Rfl: 0   traZODone (DESYREL) 50 MG tablet, Take 0.5-1 tablets (25-50 mg total) by mouth at bedtime as needed for sleep., Disp: 30 tablet, Rfl: 0   triamcinolone (NASACORT AQ) 55 MCG/ACT AERO nasal inhaler, Place 2 sprays into the nose daily. (Patient taking differently: Place 2  sprays into the nose as needed.), Disp: 1 Inhaler, Rfl: 0   Turmeric 500 MG CAPS, Take 2 capsules by mouth daily., Disp: , Rfl:    UNABLE TO FIND, Doterra Onguard and Doterra Copaiba capsules for immunity and joint pain, Disp: , Rfl:   Allergies  Allergen Reactions   Codeine    Penicillins     I personally reviewed active problem list, medication list, allergies, family history, social history, health maintenance with the patient/caregiver today.   ROS  Constitutional: Negative for fever , positive for weight change.  Respiratory: positive for cough and shortness of breath.   Cardiovascular: Negative for chest pain or palpitations.  Gastrointestinal: Negative for abdominal pain, no bowel changes.  Musculoskeletal:Positive for gait problem and bilateral knee  joint swelling.  Skin: she had hives early in the URI, healing now  Neurological: Negative for dizziness or headache.  No other specific complaints in a complete review of systems (except as listed in HPI above).   Objective  Vitals:   02/18/21 0931  BP: 134/70  Pulse: 89  Resp: 16  Temp: 98.3 F (36.8 C)  SpO2: 97%  Weight: 247 lb (112 kg)  Height: 5\' 10"  (1.778 m)    Body mass index is 35.44 kg/m.  Physical Exam  Constitutional: Patient appears well-developed and well-nourished. Obese  No distress.  HEENT: head atraumatic, normocephalic, pupils equal and reactive to light, ears normal TM, neck supple, throat within normal limits Cardiovascular: Normal rate, regular rhythm and normal heart sounds.  No murmur heard. No BLE  edema. Pulmonary/Chest: Effort normal and breath sounds normal. No respiratory distress. Abdominal: Soft.  There is no tenderness. Psychiatric: Patient has a normal mood and affect. behavior is normal. Judgment and thought content normal.   Diabetic Foot Exam: Diabetic Foot Exam - Simple   Simple Foot Form Diabetic Foot exam was performed with the following findings: Yes 02/18/2021  9:49 AM  Visual Inspection No deformities, no ulcerations, no other skin breakdown bilaterally: Yes Sensation Testing Intact to touch and monofilament testing bilaterally: Yes Pulse Check Posterior Tibialis and Dorsalis pulse intact bilaterally: Yes Comments      PHQ2/9: Depression screen [facility] 2/9 02/18/2021 01/10/2021 10/10/2020 05/23/2020 12/13/2019  Decreased Interest 0 0 0 0 0  Down, Depressed, Hopeless 0 0 0 0 0  PHQ - 2 Score 0 0 0 0 0  Altered sleeping 0 - - - -  Tired, decreased energy 0 - - - -  Change in appetite 0 - - - -  Feeling bad or failure about yourself  0 - - - -  Trouble concentrating 0 - - - -  Moving slowly or fidgety/restless 0 - - - -  Suicidal thoughts 0 - - - -  PHQ-9 Score 0 - - - -  Difficult doing work/chores - - - - -  Some recent data might be hidden    phq 9 is negative   Fall Risk: Fall Risk  02/18/2021 01/10/2021 10/10/2020 05/23/2020 12/13/2019  Falls in the past year? 0 0 0 0 0  Number falls in past yr: 0 0 0 0 0  Injury with Fall? 0 0 0 0 0  Risk for fall due to : No Fall Risks No Fall Risks No Fall Risks - No Fall Risks  Risk for fall due to: Comment - - - - -  Follow up Falls prevention discussed Falls prevention discussed Falls prevention discussed - Falls prevention discussed      Functional Status Survey:  Is the patient deaf or have difficulty hearing?: No Does the patient have difficulty seeing, even when wearing glasses/contacts?: No Does the patient have difficulty concentrating, remembering, or making decisions?: No Does the patient have difficulty  walking or climbing stairs?: Yes Does the patient have difficulty dressing or bathing?: No Does the patient have difficulty doing errands alone such as visiting a doctor's office or shopping?: No    Assessment & Plan  1. Type 2 diabetes with nephropathy (HCC)  - POCT HgB A1C - HM Diabetes Foot Exam - glipiZIDE (GLUCOTROL XL) 2.5 MG 24 hr tablet; Take 1 tablet (2.5 mg total) by mouth daily with breakfast.  Dispense: 90 tablet; Refill: 1 - metFORMIN (GLUCOPHAGE-XR) 750 MG 24 hr tablet; Take 2 tablets (1,500 mg total) by mouth daily with breakfast.  Dispense: 180 tablet; Refill: 1 - pioglitazone (ACTOS) 30 MG tablet; Take 1 tablet (30 mg total) by mouth daily.  Dispense: 90 tablet; Refill: 1  2. Dyslipidemia  - atorvastatin (LIPITOR) 40 MG tablet; Take 1 tablet (40 mg total) by mouth daily.  Dispense: 90 tablet; Refill: 1  3. Gastro-esophageal reflux disease without esophagitis  - omeprazole (PRILOSEC) 20 MG capsule; Take 1 capsule (20 mg total) by mouth daily.  Dispense: 90 capsule; Refill: 1  4. Essential (primary) hypertension  - amLODipine (NORVASC) 5 MG tablet; Take 1 tablet (5 mg total) by mouth daily.  Dispense: 90 tablet; Refill: 1 - atenolol-chlorthalidone (TENORETIC) 50-25 MG tablet; Take 1 tablet by mouth daily.  Dispense: 90 tablet; Refill: 1 - benazepril (LOTENSIN) 40 MG tablet; Take 1 tablet (40 mg total) by mouth daily.  Dispense: 90 tablet; Refill: 1  5. Primary osteoarthritis involving multiple joints   6. Osteopenia after menopause   7. Chronic pain of both knees   8. Hypertension associated with type 2 diabetes mellitus ([place])   9. Morbid obesity ([place])  BMI over 35 with co-morbidities, advised to try losing weight.   10. Microalbuminuria  - benazepril (LOTENSIN) 40 MG tablet; Take 1 tablet (40 mg total) by mouth daily.  Dispense: 90 tablet; Refill: 1

## 2021-02-18 ENCOUNTER — Ambulatory Visit (INDEPENDENT_AMBULATORY_CARE_PROVIDER_SITE_OTHER): Payer: Medicare HMO | Admitting: Family Medicine

## 2021-02-18 ENCOUNTER — Encounter: Payer: Self-pay | Admitting: Family Medicine

## 2021-02-18 ENCOUNTER — Other Ambulatory Visit: Payer: Self-pay

## 2021-02-18 VITALS — BP 134/70 | HR 89 | Temp 98.3°F | Resp 16 | Ht 70.0 in | Wt 247.0 lb

## 2021-02-18 DIAGNOSIS — Z780 Asymptomatic menopausal state: Secondary | ICD-10-CM

## 2021-02-18 DIAGNOSIS — E1159 Type 2 diabetes mellitus with other circulatory complications: Secondary | ICD-10-CM

## 2021-02-18 DIAGNOSIS — I152 Hypertension secondary to endocrine disorders: Secondary | ICD-10-CM

## 2021-02-18 DIAGNOSIS — R809 Proteinuria, unspecified: Secondary | ICD-10-CM

## 2021-02-18 DIAGNOSIS — E785 Hyperlipidemia, unspecified: Secondary | ICD-10-CM

## 2021-02-18 DIAGNOSIS — G8929 Other chronic pain: Secondary | ICD-10-CM

## 2021-02-18 DIAGNOSIS — M25561 Pain in right knee: Secondary | ICD-10-CM | POA: Diagnosis not present

## 2021-02-18 DIAGNOSIS — M8580 Other specified disorders of bone density and structure, unspecified site: Secondary | ICD-10-CM | POA: Diagnosis not present

## 2021-02-18 DIAGNOSIS — E1121 Type 2 diabetes mellitus with diabetic nephropathy: Secondary | ICD-10-CM | POA: Diagnosis not present

## 2021-02-18 DIAGNOSIS — I10 Essential (primary) hypertension: Secondary | ICD-10-CM

## 2021-02-18 DIAGNOSIS — K219 Gastro-esophageal reflux disease without esophagitis: Secondary | ICD-10-CM | POA: Diagnosis not present

## 2021-02-18 DIAGNOSIS — M159 Polyosteoarthritis, unspecified: Secondary | ICD-10-CM

## 2021-02-18 DIAGNOSIS — M25562 Pain in left knee: Secondary | ICD-10-CM

## 2021-02-18 LAB — POCT GLYCOSYLATED HEMOGLOBIN (HGB A1C): Hemoglobin A1C: 7.9 % — AB (ref 4.0–5.6)

## 2021-02-18 MED ORDER — OMEPRAZOLE 20 MG PO CPDR: 20.0000 mg | DELAYED_RELEASE_CAPSULE | Freq: Every day | ORAL | 1 refills | Status: DC

## 2021-02-18 MED ORDER — PIOGLITAZONE HCL 30 MG PO TABS: 30.0000 mg | ORAL_TABLET | Freq: Every day | ORAL | 1 refills | Status: DC

## 2021-02-18 MED ORDER — METFORMIN HCL ER 750 MG PO TB24: 1500.0000 mg | ORAL_TABLET | Freq: Every day | ORAL | 1 refills | Status: DC

## 2021-02-18 MED ORDER — BENAZEPRIL HCL 40 MG PO TABS: 40.0000 mg | ORAL_TABLET | Freq: Every day | ORAL | 1 refills | Status: DC

## 2021-02-18 MED ORDER — ATORVASTATIN CALCIUM 40 MG PO TABS: 40.0000 mg | ORAL_TABLET | Freq: Every day | ORAL | 1 refills | Status: DC

## 2021-02-18 MED ORDER — AMLODIPINE BESYLATE 5 MG PO TABS: 5.0000 mg | ORAL_TABLET | Freq: Every day | ORAL | 1 refills | Status: DC

## 2021-02-18 MED ORDER — GLIPIZIDE ER 2.5 MG PO TB24: 2.5000 mg | ORAL_TABLET | Freq: Every day | ORAL | 1 refills | Status: DC

## 2021-02-18 MED ORDER — ATENOLOL-CHLORTHALIDONE 50-25 MG PO TABS: 1.0000 | ORAL_TABLET | Freq: Every day | ORAL | 1 refills | Status: DC

## 2021-03-07 DIAGNOSIS — M170 Bilateral primary osteoarthritis of knee: Secondary | ICD-10-CM | POA: Diagnosis not present

## 2021-03-11 DIAGNOSIS — M170 Bilateral primary osteoarthritis of knee: Secondary | ICD-10-CM | POA: Insufficient documentation

## 2021-03-11 DIAGNOSIS — M1712 Unilateral primary osteoarthritis, left knee: Secondary | ICD-10-CM | POA: Insufficient documentation

## 2021-03-12 ENCOUNTER — Telehealth: Payer: Self-pay

## 2021-03-12 NOTE — Telephone Encounter (Signed)
Copied from [place] [phone]. Topic: General - Other >> Mar 12, 2021 11:06 AM Tessa Lerner A wrote: Reason for CRM: The patient would like to be prescribed a new diabetic testing meter  The patient currently has an AccuCheck that is roughly 73 years old and has recently broken  The patient would like to know what type of meter they should/would be prescribed   The patient has surgery scheduled for 38/23 and needs to get their blood sugar below 8 before then   Please contact further when possible

## 2021-03-13 ENCOUNTER — Other Ambulatory Visit: Payer: Self-pay

## 2021-03-13 DIAGNOSIS — E1121 Type 2 diabetes mellitus with diabetic nephropathy: Secondary | ICD-10-CM

## 2021-03-13 MED ORDER — BLOOD GLUCOSE METER KIT: PACK | 0 refills | Status: AC

## 2021-03-13 NOTE — Telephone Encounter (Signed)
Order placed for new meter, and supplies to [facility]

## 2021-03-24 NOTE — Discharge Instructions (Signed)
Instructions after Total Knee Replacement   Michelle Montgomery, Jr., M.D.     Dept. of Orthopaedics & Sports Medicine  [facility]  [address]  [place], [place]  [address]  Phone: [phone]   Fax: [phone]    DIET: Drink plenty of non-alcoholic fluids. Resume your normal diet. Include foods high in fiber.  ACTIVITY:  You may use crutches or a walker with weight-bearing as tolerated, unless instructed otherwise. You may be weaned off of the walker or crutches by your Physical Therapist.  Do NOT place pillows under the knee. Anything placed under the knee could limit your ability to straighten the knee.   Continue doing gentle exercises. Exercising will reduce the pain and swelling, increase motion, and prevent muscle weakness.   Please continue to use the TED compression stockings for 6 weeks. You may remove the stockings at night, but should reapply them in the morning. Do not drive or operate any equipment until instructed.  WOUND CARE:  Continue to use the PolarCare or ice packs periodically to reduce pain and swelling. You may bathe or shower after the staples are removed at the first office visit following surgery.  MEDICATIONS: You may resume your regular medications. Please take the pain medication as prescribed on the medication. Do not take pain medication on an empty stomach. You have been given a prescription for a blood thinner (Lovenox or Coumadin). Please take the medication as instructed. (NOTE: After completing a 2 week course of Lovenox, take one Enteric-coated aspirin once a day. This along with elevation will help reduce the possibility of phlebitis in your operated leg.) Do not drive or drink alcoholic beverages when taking pain medications.  CALL THE OFFICE FOR: Temperature above 101 degrees Excessive bleeding or drainage on the dressing. Excessive swelling, coldness, or paleness of the toes. Persistent nausea and vomiting.  FOLLOW-UP:  You  should have an appointment to return to the office in 10-14 days after surgery. Arrangements have been made for continuation of Physical Therapy (either home therapy or outpatient therapy).   [facility] Department Directory         [email or web address]       [email or web address]          Cardiology  Appointments: [place] - [phone] [place] - [phone]  Endocrinology  Appointments: [place] - [phone] [place] - [phone]  Gastroenterology  Appointments: [place] - [phone] [place] - [phone]        General Surgery   Appointments: [place] - [phone]  Internal Medicine/Family Medicine  Appointments: [place] - [phone] [place] - [phone] [place] - [phone]  Metabolic and Weigh Loss Surgery  Appointments: [place] - [phone]        Neurology  Appointments: [place] - [phone] [place] - [phone]  Neurosurgery  Appointments: [place] - [phone]  Obstetrics & Gynecology  Appointments: [place] - [phone] [place] - [phone]        Pediatrics  Appointments: [place] - [phone] [place] - [phone]  Physiatry  Appointments: [place] -[phone]  Physical Therapy  Appointments: [place] - [phone] [place] - [phone]        Podiatry  Appointments: [place] - [phone] [place] - [phone]  Pulmonology  Appointments: [place] - [phone]  Rheumatology  Appointments: [place] - [phone]        [place] Location: [facility]  [address] , [place]  [address]  [place] Location: [facility] [address] [place],   [address]  [place] Location: [facility] [address] [place],   [address]    

## 2021-03-27 ENCOUNTER — Other Ambulatory Visit: Payer: Self-pay

## 2021-03-27 ENCOUNTER — Encounter
Admission: RE | Admit: 2021-03-27 | Discharge: 2021-03-27 | Disposition: A | Discharge status: Home / Self Care | Payer: Medicare HMO | Source: Ambulatory Visit | Attending: Orthopedic Surgery | Admitting: Orthopedic Surgery

## 2021-03-27 VITALS — BP 133/67 | HR 73 | Temp 98.0°F | Resp 20 | Ht 70.0 in | Wt 245.0 lb

## 2021-03-27 DIAGNOSIS — E1121 Type 2 diabetes mellitus with diabetic nephropathy: Secondary | ICD-10-CM | POA: Insufficient documentation

## 2021-03-27 DIAGNOSIS — Z880 Allergy status to penicillin: Secondary | ICD-10-CM | POA: Insufficient documentation

## 2021-03-27 DIAGNOSIS — Z01818 Encounter for other preprocedural examination: Secondary | ICD-10-CM | POA: Insufficient documentation

## 2021-03-27 DIAGNOSIS — M1711 Unilateral primary osteoarthritis, right knee: Secondary | ICD-10-CM

## 2021-03-27 DIAGNOSIS — Z01812 Encounter for preprocedural laboratory examination: Secondary | ICD-10-CM

## 2021-03-27 HISTORY — DX: Malignant (primary) neoplasm, unspecified: C80.1

## 2021-03-27 HISTORY — DX: Unspecified osteoarthritis, unspecified site: M19.90

## 2021-03-27 LAB — COMPREHENSIVE METABOLIC PANEL
ALT: 22 U/L (ref 0–44)
AST: 21 U/L (ref 15–41)
Albumin: 4.6 g/dL (ref 3.5–5.0)
Alkaline Phosphatase: 32 U/L — ABNORMAL LOW (ref 38–126)
Anion gap: 10 (ref 5–15)
BUN: 19 mg/dL (ref 8–23)
CO2: 26 mmol/L (ref 22–32)
Calcium: 9.5 mg/dL (ref 8.9–10.3)
Chloride: 101 mmol/L (ref 98–111)
Creatinine, Ser: 0.6 mg/dL (ref 0.44–1.00)
GFR, Estimated: >60 mL/min (ref >60)
Glucose, Bld: 161 mg/dL — ABNORMAL HIGH (ref 70–99)
Potassium: 3.5 mmol/L (ref 3.5–5.1)
Sodium: 137 mmol/L (ref 135–145)
Total Bilirubin: 0.7 mg/dL (ref 0.3–1.2)
Total Protein: 7.8 g/dL (ref 6.5–8.1)

## 2021-03-27 LAB — CBC
HCT: 40 % (ref 36.0–46.0)
Hemoglobin: 13.5 g/dL (ref 12.0–15.0)
MCH: 30.5 pg (ref 26.0–34.0)
MCHC: 33.8 g/dL (ref 30.0–36.0)
MCV: 90.5 fL (ref 80.0–100.0)
Platelets: 221 10*3/uL (ref 150–400)
RBC: 4.42 MIL/uL (ref 3.87–5.11)
RDW: 13.2 % (ref 11.5–15.5)
WBC: 5.4 10*3/uL (ref 4.0–10.5)
nRBC: 0 % (ref 0.0–0.2)

## 2021-03-27 LAB — TYPE AND SCREEN
ABO/RH(D): A POS
Antibody Screen: NEGATIVE

## 2021-03-27 LAB — HEMOGLOBIN A1C
Hgb A1c MFr Bld: 7.1 % — ABNORMAL HIGH (ref 4.8–5.6)
Mean Plasma Glucose: 157.07 mg/dL

## 2021-03-27 LAB — URINALYSIS, ROUTINE W REFLEX MICROSCOPIC
Bilirubin Urine: NEGATIVE
Glucose, UA: NEGATIVE mg/dL
Hgb urine dipstick: NEGATIVE
Ketones, ur: NEGATIVE mg/dL
Nitrite: NEGATIVE
Protein, ur: NEGATIVE mg/dL
Specific Gravity, Urine: 1.019 (ref 1.005–1.030)
pH: 5 (ref 5.0–8.0)

## 2021-03-27 LAB — C-REACTIVE PROTEIN: CRP: 0.5 mg/dL (ref <1.0)

## 2021-03-27 LAB — SURGICAL PCR SCREEN
MRSA, PCR: NEGATIVE
Staphylococcus aureus: NEGATIVE

## 2021-03-27 LAB — SEDIMENTATION RATE: Sed Rate: 13 mm/hr (ref 0–30)

## 2021-03-27 NOTE — Patient Instructions (Addendum)
Your procedure is scheduled on: Friday April 05, 2021. Report to Day Surgery inside [facility] 2nd floor, stop by admissions desk before getting on elevator. To find out your arrival time please call [phone] between 1PM - 3PM on Thursday April 04, 2021.  Remember: Instructions that are not followed completely may result in serious medical risk,  up to and including death, or upon the discretion of your surgeon and anesthesiologist your  surgery may need to be rescheduled.     _X__ 1. Do not eat food after midnight the night before your procedure.                 No chewing gum or hard candies. You may drink clear liquids up to 2 hours                 before you are scheduled to arrive for your surgery- DO not drink clear                 liquids within 2 hours of the start of your surgery.                 Clear Liquids include:  water, apple juice without pulp, clear Gatorade, G2 or                  Gatorade Zero (avoid Red/Purple/Blue), Black Coffee or Tea (Do not add                 anything to coffee or tea).  _____2.   Complete the "Ensure Clear Pre-surgery Clear Carbohydrate Drink" provided to you, 2 hours before arrival. **If you are diabetic you will be provided with an alternative drink, Gatorade Zero or G2.  __X__3.  On the morning of surgery brush your teeth with toothpaste and water, you                may rinse your mouth with mouthwash if you wish.  Do not swallow any toothpaste of mouthwash.     _X__ 4.  No Alcohol for 24 hours before or after surgery.   _X__ 5.  Do Not Smoke or use e-cigarettes For 24 Hours Prior to Your Surgery.                 Do not use any chewable tobacco products for at least 6 hours prior to                 Surgery.  _X__ 6.  Do not use any recreational drugs (marijuana, cocaine, heroin, ecstasy, MDMA or other)                For at least one week prior to your surgery.  Combination of these drugs with  anesthesia                May have life threatening results.  ____  7.  Bring all medications with you on the day of surgery if instructed.   __X__ 8.  Notify your doctor if there is any change in your medical condition      (cold, fever, infections).     Do not wear jewelry, make-up, hairpins, clips or nail polish. Do not wear lotions, powders, or perfumes. You may wear deodorant. Do not shave 48 hours prior to surgery. Men may shave face and neck. Do not bring valuables to the hospital.    [facility] is not responsible for any belongings or valuables.  Contacts, dentures  or bridgework may not be worn into surgery. Leave your suitcase in the car. After surgery it may be brought to your room. For patients admitted to the hospital, discharge time is determined by your treatment team.   Patients discharged the day of surgery will not be allowed to drive home.   Make arrangements for someone to be with you for the first 24 hours of your Same Day Discharge.   __X__ Take these medicines the morning of surgery with A SIP OF WATER:    1. amLODipine (NORVASC) 5 MG   2. omeprazole (PRILOSEC) 20 MG (take one dose the night before and the morning of surgery)  3.   5.  6.  ____ Fleet Enema (as directed)   __X__ Use CHG Soap (or wipes) as directed  ____ Use Benzoyl Peroxide Gel as instructed  ____ Use inhalers on the day of surgery  __X__ Stop metFORMIN (GLUCOPHAGE-XR)  2 days prior to surgery (2/21 will be last dose)     ____ Take 1/2 of usual insulin dose the night before surgery. No insulin the morning          of surgery.   __X__ Ask your doctor when to stop aspirin  before your surgery.   __X__ One Week prior to surgery- Stop Anti-inflammatories such as Ibuprofen, Aleve, Advil, Motrin, meloxicam (MOBIC), diclofenac, etodolac, ketorolac, Toradol, Daypro, piroxicam, Goody's or BC powders. OK TO USE TYLENOL IF NEEDED   __X__ One week prior to surgery-stop ALL supplements until  after surgery.    ____ Bring C-Pap to the hospital.    If you have any questions regarding your pre-procedure instructions,  Please call Pre-admit Testing at [phone]

## 2021-03-31 NOTE — H&P (Signed)
ORTHOPAEDIC HISTORY & PHYSICAL [name], [place] - 03/28/2021 8:45 AM EST Formatting of this note is different from the original. [place] [facility] Chief Complaint:   Chief Complaint  Patient presents with   Knee Pain  H & P RIGHT KNEE   History of Present Illness:   Michelle Montgomery is a 71 y.o. female that presents to clinic today for her preoperative history and evaluation. Patient presents unaccompanied. The patient is scheduled to undergo a right total knee arthroplasty on 04/05/21 by Dr. Marry Guan. Her pain began several years ago. The pain is located primarily along the medial aspect of the knee. She describes her pain as worse with weightbearing. She reports associated swelling, some giving way of the knees, and crepitus. She denies associated numbness or tingling, denies locking.   The patient's symptoms have progressed to the point that they decrease her quality of life. The patient has previously undergone conservative treatment including NSAIDS and injections to the knee without adequate control of her symptoms.  Denies significant cardiac history, history of low back surgery, history of DVT. Does report rash when taking penicillin as a child.   Patient has her husband at home to help after surgery.   Past Medical, Surgical, Family, Social History, Allergies, Medications:   Past Medical History:  Past Medical History:  Diagnosis Date   Diabetes type 2, controlled (CMS-HCC)   Hypertension   Past Surgical History:  Past Surgical History:  Procedure Laterality Date   COLONOSCOPY 01/16/2004  Adenomatous Polyps, FHCC (Mother), FH Colon Polyps (Brother)   EGD 01/16/2004  Barrett's Esophagus, FH Esophageal Cancer (Father)   COLONOSCOPY 10/10/2010  PH Adenomatous Polyps, FHCC (Mother), FH Colon Polyps (Brother): [facility] 09/2015; Recall Ltr mailed 08/15/2015 (dw)   EGD 10/10/2010  No Barrett's Seen, FH Esophageal Cancer (Father): [facility]BF  09/2015; Recall Ltr mailed 08/15/2015 (dw)   EGD 10/27/2017  PH Barrett's Esophagus: No repeat per RTE   COLONOSCOPY 10/27/2017  Adenomatous Polyp; [place] (Mother) FHCP (Brother) [facility]BF 10/2022   ACHILLES TENDON REPAIR   Current Medications:  Current Outpatient Medications  Medication Sig Dispense Refill   amLODIPine (NORVASC) 5 MG tablet Take 5 mg by mouth once daily   aspirin-calcium carbonate 81 mg-300 mg calcium(777 mg) Tab Take 1 tablet by mouth once daily   atenoloL-chlorthalidone (TENORETIC) 50-25 mg tablet Take 1 tablet by mouth once daily   atorvastatin (LIPITOR) 40 MG tablet Take 40 mg by mouth once daily   benazepril (LOTENSIN) 40 MG tablet Take 40 mg by mouth once daily   cholecalciferol (VITAMIN D3) 2,000 unit capsule Take 2,000 Units by mouth once daily   coenzyme Q10 (CO Q-10) 300 mg Cap Take 1 capsule by mouth once daily   docosahexanoic acid/epa (FISH OIL ORAL) Take 1,000 mg by mouth once daily   glipiZIDE (GLUCOTROL) 2.5 MG XL tablet Take 2.5 mg by mouth once daily   melatonin 5 mg Tab Take 1 tablet by mouth nightly   meloxicam (MOBIC) 15 MG tablet Take 15 mg by mouth once daily as needed   metFORMIN (GLUCOPHAGE-XR) 750 MG XR tablet Take 1 tablet by mouth 2 (two) times daily with meals   multivitamin tablet Take 1 tablet by mouth once daily   omeprazole (PRILOSEC) 20 MG DR capsule Take by mouth once daily   pioglitazone (ACTOS) 30 MG tablet Take 1 tablet by mouth once daily   turmeric/turmeric ext/pepr ext (TURMERIC-TURMERIC EXT-PEPPER) 500-3 mg Cap Take 2 capsules by mouth once daily  No current facility-administered medications for this visit.   Allergies:  Allergies  Allergen Reactions   Codeine Nausea and Vomiting  Passed out   Penicillin G Rash   Penicillins Rash   Social History:  Social History   Socioeconomic History   Marital status: Married  Spouse name: Iona Beard   Number of children: 2   Years of education: 12   Highest education level: High school  graduate  Occupational History   Occupation: Retired- Software engineer- now mission work  Tobacco Use   Smoking status: Former  Years: 3.00  Types: Cigarettes  Quit date: 1972  Years since quitting: 52.1   Smokeless tobacco: Never  Vaping Use   Vaping Use: Never used  Substance and Sexual Activity   Alcohol use: Never   Drug use: Never   Sexual activity: Defer  Partners: Male   Family History:  Family History  Problem Relation Age of Onset   Breast cancer Mother   Colon cancer Mother   Diabetes type II Brother   Diabetes type II Brother   Review of Systems:   A 10+ ROS was performed, reviewed, and the pertinent orthopaedic findings are documented in the HPI.   Physical Examination:   BP (!) 140/78 (BP Location: Left upper arm, Patient Position: Sitting, BP Cuff Size: Large Adult)   Ht 177.8 cm (5\' 10" )   Wt (!) 110.6 kg (243 lb 12.8 oz)   BMI 34.98 kg/m   Patient is a well-developed, well-nourished female in no acute distress. Patient has normal mood and affect. Patient is alert and oriented to person, place, and time.   HEENT: Atraumatic, normocephalic. Pupils equal and reactive to light. Extraocular motion intact. Noninjected sclera.  Cardiovascular: Regular rate and rhythm, with no murmurs, rubs, or gallops. Distal pulses auscultated with Doppler.  Respiratory: Lungs clear to auscultation bilaterally.   Right Knee: Soft tissue swelling: mild Effusion: none Erythema: none Crepitance: mild Tenderness: medial Alignment: relative varus Mediolateral laxity: medial pseudolaxity Posterior sag: negative Patellar tracking: Good tracking without evidence of subluxation or tilt Atrophy: No significant atrophy.  Quadriceps tone was fair to good. Range of motion: 0/10/112 degrees  Sensation intact over the saphenous, lateral sural cutaneous, superficial fibular, and deep fibular nerve distributions.  Tests Performed/Reviewed:  X-rays  X-ray knee  right 3 views  3 views of the right knee were reviewed today. Images show there is narrowing of the medial cartilage space with bone-on-bone articulation and associated varus alignment. Osteophyte formation is noted. Subchondral sclerosis is noted. No evidence of fracture or dislocation.   Impression:   ICD-10-CM  1. Primary osteoarthritis of right knee M17.11   Plan:   The patient has end-stage degenerative changes of the right knee. It was explained to the patient that the condition is progressive in nature. Having failed conservative treatment, the patient has elected to proceed with a total joint arthroplasty. The patient will undergo a total joint arthroplasty with Dr. Marry Guan. The risks of surgery, including blood clot and infection, were discussed with the patient. Measures to reduce these risks, including the use of anticoagulation, perioperative antibiotics, and early ambulation were discussed. The importance of postoperative physical therapy was discussed with the patient. The patient elects to proceed with surgery. The patient is instructed to stop all blood thinners prior to surgery. The patient is instructed to call the hospital the day before surgery to learn of the proper arrival time.   Contact our office with any questions or concerns. Follow up  as indicated, or sooner should any new problems arise, if conditions worsen, or if they are otherwise concerned.   [name], [facility] [place], [place] [address] Phone: [phone]  This note was generated in part with voice recognition software and I apologize for any typographical errors that were not detected and corrected.  Electronically signed by [name], PA at 03/28/2021 10:44 AM EST

## 2021-04-02 LAB — IGE: IgE (Immunoglobulin E), Serum: 17 IU/mL (ref 6–495)

## 2021-04-03 ENCOUNTER — Other Ambulatory Visit: Payer: Self-pay

## 2021-04-03 ENCOUNTER — Other Ambulatory Visit
Admission: RE | Admit: 2021-04-03 | Discharge: 2021-04-03 | Disposition: A | Discharge status: Home / Self Care | Payer: Medicare HMO | Source: Ambulatory Visit | Attending: Orthopedic Surgery | Admitting: Orthopedic Surgery

## 2021-04-03 DIAGNOSIS — Z01812 Encounter for preprocedural laboratory examination: Secondary | ICD-10-CM | POA: Diagnosis not present

## 2021-04-03 DIAGNOSIS — Z20822 Contact with and (suspected) exposure to covid-19: Secondary | ICD-10-CM | POA: Insufficient documentation

## 2021-04-03 LAB — SARS CORONAVIRUS 2 (TAT 6-24 HRS): SARS Coronavirus 2: NEGATIVE

## 2021-04-04 ENCOUNTER — Encounter: Payer: Self-pay | Admitting: Orthopedic Surgery

## 2021-04-05 ENCOUNTER — Encounter
Admission: RE | Disposition: A | Discharge status: Home Health | Payer: Self-pay | Source: Home / Self Care | Attending: Orthopedic Surgery

## 2021-04-05 ENCOUNTER — Other Ambulatory Visit: Payer: Self-pay

## 2021-04-05 ENCOUNTER — Inpatient Hospital Stay: Payer: Medicare HMO | Admitting: Urgent Care

## 2021-04-05 ENCOUNTER — Encounter: Payer: Self-pay | Admitting: Orthopedic Surgery

## 2021-04-05 ENCOUNTER — Inpatient Hospital Stay: Payer: Medicare HMO | Admitting: Anesthesiology

## 2021-04-05 ENCOUNTER — Observation Stay
Admission: RE | Admit: 2021-04-05 | Discharge: 2021-04-06 | Disposition: A | Discharge status: Home Health | Payer: Medicare HMO | Attending: Orthopedic Surgery | Admitting: Orthopedic Surgery

## 2021-04-05 ENCOUNTER — Inpatient Hospital Stay: Payer: Medicare HMO

## 2021-04-05 DIAGNOSIS — Z7984 Long term (current) use of oral hypoglycemic drugs: Secondary | ICD-10-CM | POA: Insufficient documentation

## 2021-04-05 DIAGNOSIS — Z79899 Other long term (current) drug therapy: Secondary | ICD-10-CM | POA: Diagnosis not present

## 2021-04-05 DIAGNOSIS — Z96651 Presence of right artificial knee joint: Secondary | ICD-10-CM | POA: Diagnosis not present

## 2021-04-05 DIAGNOSIS — Z87891 Personal history of nicotine dependence: Secondary | ICD-10-CM | POA: Insufficient documentation

## 2021-04-05 DIAGNOSIS — Z96659 Presence of unspecified artificial knee joint: Secondary | ICD-10-CM

## 2021-04-05 DIAGNOSIS — Z859 Personal history of malignant neoplasm, unspecified: Secondary | ICD-10-CM | POA: Insufficient documentation

## 2021-04-05 DIAGNOSIS — E1121 Type 2 diabetes mellitus with diabetic nephropathy: Secondary | ICD-10-CM

## 2021-04-05 DIAGNOSIS — E119 Type 2 diabetes mellitus without complications: Secondary | ICD-10-CM | POA: Insufficient documentation

## 2021-04-05 DIAGNOSIS — Z7982 Long term (current) use of aspirin: Secondary | ICD-10-CM | POA: Diagnosis not present

## 2021-04-05 DIAGNOSIS — I10 Essential (primary) hypertension: Secondary | ICD-10-CM | POA: Insufficient documentation

## 2021-04-05 DIAGNOSIS — M1711 Unilateral primary osteoarthritis, right knee: Principal | ICD-10-CM | POA: Insufficient documentation

## 2021-04-05 DIAGNOSIS — Z880 Allergy status to penicillin: Secondary | ICD-10-CM

## 2021-04-05 DIAGNOSIS — Z471 Aftercare following joint replacement surgery: Secondary | ICD-10-CM | POA: Diagnosis not present

## 2021-04-05 HISTORY — PX: KNEE ARTHROPLASTY: SHX992

## 2021-04-05 LAB — GLUCOSE, CAPILLARY
Glucose-Capillary: 180 mg/dL — ABNORMAL HIGH (ref 70–99)
Glucose-Capillary: 200 mg/dL — ABNORMAL HIGH (ref 70–99)
Glucose-Capillary: 242 mg/dL — ABNORMAL HIGH (ref 70–99)
Glucose-Capillary: 191 mg/dL — ABNORMAL HIGH (ref 70–99)

## 2021-04-05 LAB — ABO/RH: ABO/RH(D): A POS

## 2021-04-05 SURGERY — ARTHROPLASTY, KNEE, TOTAL, USING IMAGELESS COMPUTER-ASSISTED NAVIGATION
Anesthesia: Spinal | Site: Knee | Laterality: Right

## 2021-04-05 MED ORDER — SODIUM CHLORIDE 0.9 % IR SOLN
Status: DC | PRN
Start: 2021-04-05 — End: 2021-04-05
  Administered 2021-04-05: 3000 mL

## 2021-04-05 MED ORDER — TRANEXAMIC ACID-NACL 1000-0.7 MG/100ML-% IV SOLN
INTRAVENOUS | Status: AC
  Filled 2021-04-05: qty 100

## 2021-04-05 MED ORDER — MENTHOL 3 MG MT LOZG
1.0000 | LOZENGE | OROMUCOSAL | Status: DC | PRN
  Filled 2021-04-05: qty 9

## 2021-04-05 MED ORDER — GLIPIZIDE ER 2.5 MG PO TB24
2.5000 mg | ORAL_TABLET | Freq: Every day | ORAL | Status: DC
  Filled 2021-04-05: qty 1

## 2021-04-05 MED ORDER — SODIUM CHLORIDE 0.9 % IV SOLN: INTRAVENOUS | Status: DC

## 2021-04-05 MED ORDER — CEFAZOLIN SODIUM-DEXTROSE 2-4 GM/100ML-% IV SOLN
INTRAVENOUS | Status: AC
  Filled 2021-04-05: qty 100

## 2021-04-05 MED ORDER — PROPOFOL 1000 MG/100ML IV EMUL
INTRAVENOUS | Status: AC
  Filled 2021-04-05: qty 100

## 2021-04-05 MED ORDER — METFORMIN HCL ER 750 MG PO TB24
750.0000 mg | ORAL_TABLET | Freq: Two times a day (BID) | ORAL | Status: DC
  Administered 2021-04-05 – 2021-04-06 (×2): 750 mg via ORAL
  Filled 2021-04-05 (×3): qty 1

## 2021-04-05 MED ORDER — PRONTOSAN WOUND IRRIGATION OPTIME
TOPICAL | Status: DC | PRN
  Administered 2021-04-05: 1

## 2021-04-05 MED ORDER — PHENOL 1.4 % MT LIQD
1.0000 | OROMUCOSAL | Status: DC | PRN
  Filled 2021-04-05: qty 177

## 2021-04-05 MED ORDER — ALUM & MAG HYDROXIDE-SIMETH 200-200-20 MG/5ML PO SUSP: 30.0000 mL | ORAL | Status: DC | PRN

## 2021-04-05 MED ORDER — EPHEDRINE SULFATE (PRESSORS) 50 MG/ML IJ SOLN
INTRAMUSCULAR | Status: DC | PRN
  Administered 2021-04-05: 7.5 mg via INTRAVENOUS

## 2021-04-05 MED ORDER — ONDANSETRON HCL 4 MG PO TABS: 4.0000 mg | ORAL_TABLET | Freq: Four times a day (QID) | ORAL | Status: DC | PRN

## 2021-04-05 MED ORDER — ASCORBIC ACID 500 MG PO TABS
1000.0000 mg | ORAL_TABLET | Freq: Every day | ORAL | Status: DC
  Administered 2021-04-06: 1000 mg via ORAL
  Filled 2021-04-05: qty 2

## 2021-04-05 MED ORDER — PIOGLITAZONE HCL 30 MG PO TABS
30.0000 mg | ORAL_TABLET | Freq: Every day | ORAL | Status: DC
  Administered 2021-04-05 – 2021-04-06 (×2): 30 mg via ORAL
  Filled 2021-04-05 (×2): qty 1

## 2021-04-05 MED ORDER — OXYCODONE HCL 5 MG PO TABS
5.0000 mg | ORAL_TABLET | Freq: Once | ORAL | Status: AC | PRN
  Administered 2021-04-05: 5 mg via ORAL

## 2021-04-05 MED ORDER — LORATADINE 10 MG PO TABS
10.0000 mg | ORAL_TABLET | Freq: Every day | ORAL | Status: DC
  Administered 2021-04-06: 10 mg via ORAL
  Filled 2021-04-05: qty 1

## 2021-04-05 MED ORDER — DIPHENHYDRAMINE HCL 12.5 MG/5ML PO ELIX
12.5000 mg | ORAL_SOLUTION | ORAL | Status: DC | PRN
  Administered 2021-04-06: 12.5 mg via ORAL
  Filled 2021-04-05: qty 5

## 2021-04-05 MED ORDER — SODIUM CHLORIDE FLUSH 0.9 % IV SOLN
INTRAVENOUS | Status: AC
  Filled 2021-04-05: qty 80

## 2021-04-05 MED ORDER — CELECOXIB 200 MG PO CAPS: 400.0000 mg | ORAL_CAPSULE | Freq: Once | ORAL | Status: AC

## 2021-04-05 MED ORDER — OYSTER SHELL CALCIUM/D3 500-5 MG-MCG PO TABS
1.0000 | ORAL_TABLET | Freq: Two times a day (BID) | ORAL | Status: DC
  Administered 2021-04-05 – 2021-04-06 (×2): 1 via ORAL
  Filled 2021-04-05 (×2): qty 1

## 2021-04-05 MED ORDER — ACETAMINOPHEN 10 MG/ML IV SOLN
1000.0000 mg | Freq: Four times a day (QID) | INTRAVENOUS | Status: DC
  Administered 2021-04-05 – 2021-04-06 (×2): 1000 mg via INTRAVENOUS
  Filled 2021-04-05 (×3): qty 100

## 2021-04-05 MED ORDER — PROPOFOL 10 MG/ML IV BOLUS
INTRAVENOUS | Status: AC
  Filled 2021-04-05: qty 20

## 2021-04-05 MED ORDER — EPHEDRINE 5 MG/ML INJ
INTRAVENOUS | Status: AC
  Filled 2021-04-05: qty 5

## 2021-04-05 MED ORDER — ATENOLOL-CHLORTHALIDONE 50-25 MG PO TABS
1.0000 | ORAL_TABLET | Freq: Every day | ORAL | Status: DC
Start: 2021-04-05 — End: 2021-04-05

## 2021-04-05 MED ORDER — CHLORHEXIDINE GLUCONATE 4 % EX LIQD: 60.0000 mL | Freq: Once | CUTANEOUS | Status: DC

## 2021-04-05 MED ORDER — ACETAMINOPHEN 325 MG PO TABS: 325.0000 mg | ORAL_TABLET | Freq: Four times a day (QID) | ORAL | Status: DC | PRN

## 2021-04-05 MED ORDER — ATENOLOL 25 MG PO TABS
25.0000 mg | ORAL_TABLET | Freq: Every day | ORAL | Status: DC
  Administered 2021-04-05 – 2021-04-06 (×2): 25 mg via ORAL
  Filled 2021-04-05 (×2): qty 1

## 2021-04-05 MED ORDER — ACETAMINOPHEN 10 MG/ML IV SOLN: 1000.0000 mg | Freq: Once | INTRAVENOUS | Status: DC | PRN

## 2021-04-05 MED ORDER — CHLORHEXIDINE GLUCONATE 0.12 % MT SOLN
OROMUCOSAL | Status: AC
  Administered 2021-04-05: 15 mL via OROMUCOSAL
  Filled 2021-04-05: qty 15

## 2021-04-05 MED ORDER — ACETAMINOPHEN 10 MG/ML IV SOLN
INTRAVENOUS | Status: AC
  Filled 2021-04-05: qty 100

## 2021-04-05 MED ORDER — CEFAZOLIN SODIUM-DEXTROSE 2-4 GM/100ML-% IV SOLN
2.0000 g | INTRAVENOUS | Status: AC
  Administered 2021-04-05: 2 g via INTRAVENOUS

## 2021-04-05 MED ORDER — BENAZEPRIL HCL 20 MG PO TABS
40.0000 mg | ORAL_TABLET | Freq: Every day | ORAL | Status: DC
  Administered 2021-04-05 – 2021-04-06 (×2): 40 mg via ORAL
  Filled 2021-04-05 (×2): qty 2

## 2021-04-05 MED ORDER — OXYCODONE HCL 5 MG PO TABS: 10.0000 mg | ORAL_TABLET | ORAL | Status: DC | PRN

## 2021-04-05 MED ORDER — OXYCODONE HCL 5 MG PO TABS
5.0000 mg | ORAL_TABLET | ORAL | Status: DC | PRN
  Administered 2021-04-06 (×2): 5 mg via ORAL
  Filled 2021-04-05 (×3): qty 1

## 2021-04-05 MED ORDER — TRANEXAMIC ACID-NACL 1000-0.7 MG/100ML-% IV SOLN
1000.0000 mg | Freq: Once | INTRAVENOUS | Status: AC
  Administered 2021-04-05: 1000 mg via INTRAVENOUS

## 2021-04-05 MED ORDER — CEFAZOLIN SODIUM-DEXTROSE 2-4 GM/100ML-% IV SOLN
2.0000 g | Freq: Four times a day (QID) | INTRAVENOUS | Status: AC
  Administered 2021-04-05 (×2): 2 g via INTRAVENOUS
  Filled 2021-04-05 (×2): qty 100

## 2021-04-05 MED ORDER — FLEET ENEMA 7-19 GM/118ML RE ENEM: 1.0000 | ENEMA | Freq: Once | RECTAL | Status: DC | PRN

## 2021-04-05 MED ORDER — GABAPENTIN 300 MG PO CAPS
ORAL_CAPSULE | ORAL | Status: AC
  Administered 2021-04-05: 300 mg via ORAL
  Filled 2021-04-05: qty 1

## 2021-04-05 MED ORDER — FENTANYL CITRATE (PF) 100 MCG/2ML IJ SOLN
INTRAMUSCULAR | Status: AC
  Filled 2021-04-05: qty 2

## 2021-04-05 MED ORDER — DEXAMETHASONE SODIUM PHOSPHATE 10 MG/ML IJ SOLN
INTRAMUSCULAR | Status: AC
  Administered 2021-04-05: 8 mg via INTRAVENOUS
  Filled 2021-04-05: qty 1

## 2021-04-05 MED ORDER — CHLORTHALIDONE 25 MG PO TABS
25.0000 mg | ORAL_TABLET | Freq: Every day | ORAL | Status: DC
  Administered 2021-04-05 – 2021-04-06 (×2): 25 mg via ORAL
  Filled 2021-04-05 (×2): qty 1

## 2021-04-05 MED ORDER — PROPOFOL 10 MG/ML IV BOLUS
INTRAVENOUS | Status: DC | PRN
  Administered 2021-04-05: 50 mg via INTRAVENOUS

## 2021-04-05 MED ORDER — BUPIVACAINE HCL (PF) 0.5 % IJ SOLN
INTRAMUSCULAR | Status: DC | PRN
  Administered 2021-04-05: 3 mL via INTRATHECAL

## 2021-04-05 MED ORDER — VITAMIN E 45 MG (100 UNIT) PO CAPS
400.0000 [IU] | ORAL_CAPSULE | Freq: Every day | ORAL | Status: DC
  Administered 2021-04-06: 400 [IU] via ORAL
  Filled 2021-04-05 (×2): qty 4

## 2021-04-05 MED ORDER — MIDAZOLAM HCL 5 MG/5ML IJ SOLN
INTRAMUSCULAR | Status: DC | PRN
  Administered 2021-04-05: 2 mg via INTRAVENOUS

## 2021-04-05 MED ORDER — AMLODIPINE BESYLATE 5 MG PO TABS
5.0000 mg | ORAL_TABLET | Freq: Every day | ORAL | Status: DC
  Administered 2021-04-06: 5 mg via ORAL
  Filled 2021-04-05: qty 1

## 2021-04-05 MED ORDER — ORAL CARE MOUTH RINSE: 15.0000 mL | Freq: Once | OROMUCOSAL | Status: AC

## 2021-04-05 MED ORDER — OXYCODONE HCL 5 MG PO TABS
ORAL_TABLET | ORAL | Status: AC
  Filled 2021-04-05: qty 1

## 2021-04-05 MED ORDER — ATORVASTATIN CALCIUM 20 MG PO TABS
40.0000 mg | ORAL_TABLET | Freq: Every day | ORAL | Status: DC
Start: 2021-04-05 — End: 2021-04-06
  Administered 2021-04-05 – 2021-04-06 (×2): 40 mg via ORAL
  Filled 2021-04-05 (×2): qty 2

## 2021-04-05 MED ORDER — SODIUM CHLORIDE (PF) 0.9 % IJ SOLN
INTRAMUSCULAR | Status: DC | PRN
  Administered 2021-04-05: 120 mL via INTRAMUSCULAR

## 2021-04-05 MED ORDER — PANTOPRAZOLE SODIUM 40 MG PO TBEC
40.0000 mg | DELAYED_RELEASE_TABLET | Freq: Two times a day (BID) | ORAL | Status: DC
  Administered 2021-04-06: 40 mg via ORAL
  Filled 2021-04-05: qty 1

## 2021-04-05 MED ORDER — SENNOSIDES-DOCUSATE SODIUM 8.6-50 MG PO TABS
1.0000 | ORAL_TABLET | Freq: Two times a day (BID) | ORAL | Status: DC
Start: 2021-04-05 — End: 2021-04-06
  Administered 2021-04-05 – 2021-04-06 (×2): 1 via ORAL
  Filled 2021-04-05 (×2): qty 1

## 2021-04-05 MED ORDER — MIDAZOLAM HCL 2 MG/2ML IJ SOLN
INTRAMUSCULAR | Status: AC
  Filled 2021-04-05: qty 2

## 2021-04-05 MED ORDER — ENOXAPARIN SODIUM 30 MG/0.3ML IJ SOSY
30.0000 mg | PREFILLED_SYRINGE | Freq: Two times a day (BID) | INTRAMUSCULAR | Status: DC
  Administered 2021-04-06: 30 mg via SUBCUTANEOUS
  Filled 2021-04-05: qty 0.3

## 2021-04-05 MED ORDER — FERROUS SULFATE 325 (65 FE) MG PO TABS
325.0000 mg | ORAL_TABLET | Freq: Two times a day (BID) | ORAL | Status: DC
  Administered 2021-04-06: 325 mg via ORAL
  Filled 2021-04-05: qty 1

## 2021-04-05 MED ORDER — FENTANYL CITRATE (PF) 100 MCG/2ML IJ SOLN
25.0000 ug | INTRAMUSCULAR | Status: DC | PRN
  Administered 2021-04-05: 25 ug via INTRAVENOUS

## 2021-04-05 MED ORDER — BUPIVACAINE LIPOSOME 1.3 % IJ SUSP
INTRAMUSCULAR | Status: AC
  Filled 2021-04-05: qty 40

## 2021-04-05 MED ORDER — PSYLLIUM 95 % PO PACK: 1.0000 | PACK | Freq: Every day | ORAL | Status: DC

## 2021-04-05 MED ORDER — HYDROMORPHONE HCL 1 MG/ML IJ SOLN: 0.5000 mg | INTRAMUSCULAR | Status: DC | PRN

## 2021-04-05 MED ORDER — BUPIVACAINE HCL (PF) 0.25 % IJ SOLN
INTRAMUSCULAR | Status: AC
  Filled 2021-04-05: qty 120

## 2021-04-05 MED ORDER — VITAMIN D3 25 MCG (1000 UNIT) PO TABS
2000.0000 [IU] | ORAL_TABLET | Freq: Every day | ORAL | Status: DC
  Administered 2021-04-06: 2000 [IU] via ORAL
  Filled 2021-04-05: qty 2

## 2021-04-05 MED ORDER — ONDANSETRON HCL 4 MG/2ML IJ SOLN
4.0000 mg | Freq: Four times a day (QID) | INTRAMUSCULAR | Status: DC | PRN
  Administered 2021-04-05: 4 mg via INTRAVENOUS
  Filled 2021-04-05: qty 2

## 2021-04-05 MED ORDER — BUPIVACAINE HCL (PF) 0.5 % IJ SOLN
INTRAMUSCULAR | Status: AC
  Filled 2021-04-05: qty 10

## 2021-04-05 MED ORDER — 0.9 % SODIUM CHLORIDE (POUR BTL) OPTIME
TOPICAL | Status: DC | PRN
  Administered 2021-04-05: 500 mL

## 2021-04-05 MED ORDER — INSULIN ASPART 100 UNIT/ML IJ SOLN
0.0000 [IU] | Freq: Three times a day (TID) | INTRAMUSCULAR | Status: DC
  Administered 2021-04-05: 5 [IU] via SUBCUTANEOUS
  Administered 2021-04-06 (×2): 3 [IU] via SUBCUTANEOUS
  Filled 2021-04-05 (×3): qty 1

## 2021-04-05 MED ORDER — BISACODYL 10 MG RE SUPP: 10.0000 mg | Freq: Every day | RECTAL | Status: DC | PRN

## 2021-04-05 MED ORDER — CHLORHEXIDINE GLUCONATE 0.12 % MT SOLN: 15.0000 mL | Freq: Once | OROMUCOSAL | Status: AC

## 2021-04-05 MED ORDER — GABAPENTIN 300 MG PO CAPS: 300.0000 mg | ORAL_CAPSULE | Freq: Once | ORAL | Status: AC

## 2021-04-05 MED ORDER — CELECOXIB 200 MG PO CAPS
200.0000 mg | ORAL_CAPSULE | Freq: Two times a day (BID) | ORAL | Status: DC
  Administered 2021-04-05 – 2021-04-06 (×2): 200 mg via ORAL
  Filled 2021-04-05 (×2): qty 1

## 2021-04-05 MED ORDER — ADULT MULTIVITAMIN W/MINERALS CH
1.0000 | ORAL_TABLET | Freq: Every day | ORAL | Status: DC
  Administered 2021-04-06: 1 via ORAL
  Filled 2021-04-05: qty 1

## 2021-04-05 MED ORDER — CELECOXIB 200 MG PO CAPS
ORAL_CAPSULE | ORAL | Status: AC
  Administered 2021-04-05: 400 mg via ORAL
  Filled 2021-04-05: qty 2

## 2021-04-05 MED ORDER — OXYCODONE HCL 5 MG/5ML PO SOLN: 5.0000 mg | Freq: Once | ORAL | Status: AC | PRN

## 2021-04-05 MED ORDER — PROPOFOL 500 MG/50ML IV EMUL
INTRAVENOUS | Status: DC | PRN
  Administered 2021-04-05: 100 ug/kg/min via INTRAVENOUS

## 2021-04-05 MED ORDER — INSULIN ASPART 100 UNIT/ML IJ SOLN: 0.0000 [IU] | Freq: Every day | INTRAMUSCULAR | Status: DC

## 2021-04-05 MED ORDER — DROPERIDOL 2.5 MG/ML IJ SOLN
0.6250 mg | Freq: Once | INTRAMUSCULAR | Status: DC | PRN
  Filled 2021-04-05: qty 2

## 2021-04-05 MED ORDER — TRAMADOL HCL 50 MG PO TABS
50.0000 mg | ORAL_TABLET | ORAL | Status: DC | PRN
  Administered 2021-04-05 (×2): 100 mg via ORAL
  Filled 2021-04-05 (×2): qty 2

## 2021-04-05 MED ORDER — MAGNESIUM HYDROXIDE 400 MG/5ML PO SUSP
30.0000 mL | Freq: Every day | ORAL | Status: DC
  Filled 2021-04-05: qty 30

## 2021-04-05 MED ORDER — DEXAMETHASONE SODIUM PHOSPHATE 10 MG/ML IJ SOLN: 8.0000 mg | Freq: Once | INTRAMUSCULAR | Status: AC

## 2021-04-05 MED ORDER — METOCLOPRAMIDE HCL 10 MG PO TABS
10.0000 mg | ORAL_TABLET | Freq: Three times a day (TID) | ORAL | Status: DC
Start: 2021-04-05 — End: 2021-04-06
  Administered 2021-04-05 – 2021-04-06 (×3): 10 mg via ORAL
  Filled 2021-04-05 (×3): qty 1

## 2021-04-05 MED ORDER — TRANEXAMIC ACID-NACL 1000-0.7 MG/100ML-% IV SOLN
1000.0000 mg | INTRAVENOUS | Status: AC
  Administered 2021-04-05: 1000 mg via INTRAVENOUS

## 2021-04-05 MED ORDER — OMEGA-3-ACID ETHYL ESTERS 1 G PO CAPS
1.0000 g | ORAL_CAPSULE | Freq: Every day | ORAL | Status: DC
  Administered 2021-04-06: 1 g via ORAL
  Filled 2021-04-05: qty 1

## 2021-04-05 SURGICAL SUPPLY — 79 items
ATTUNE MED DOME PAT 41 KNEE (Knees) ×1 IMPLANT
ATTUNE PS FEM RT SZ 6 CEM KNEE (Femur) ×1 IMPLANT
ATTUNE PSRP INSR SZ6 12 KNEE (Insert) ×1 IMPLANT
BASE TIBIA ATTUNE KNEE SYS SZ6 (Knees) IMPLANT
BATTERY INSTRU NAVIGATION (MISCELLANEOUS) ×8 IMPLANT
BLADE CLIPPER SURG (BLADE) ×1 IMPLANT
BLADE SAW 70X12.5 (BLADE) ×2 IMPLANT
BLADE SAW 90X13X1.19 OSCILLAT (BLADE) ×2 IMPLANT
BLADE SAW 90X25X1.19 OSCILLAT (BLADE) ×2 IMPLANT
BONE CEMENT GENTAMICIN (Cement) IMPLANT
BOWL CEMENT MIXING ADV NOZZLE (MISCELLANEOUS) ×1 IMPLANT
CEMENT BONE GENTAMICIN 40 (Cement) IMPLANT
CEMENT GMV SMARTSET GENT 40G (Cement) ×2 IMPLANT
COOLER POLAR GLACIER W/PUMP (MISCELLANEOUS) ×2 IMPLANT
CUFF TOURN SGL QUICK 24 (TOURNIQUET CUFF)
CUFF TOURN SGL QUICK 34 (TOURNIQUET CUFF) ×1
CUFF TRNQT CYL 24X4X16.5-23 (TOURNIQUET CUFF) IMPLANT
CUFF TRNQT CYL 34X4.125X (TOURNIQUET CUFF) IMPLANT
DRAPE 3/4 80X56 (DRAPES) ×2 IMPLANT
DRAPE INCISE IOBAN 66X45 STRL (DRAPES) ×2 IMPLANT
DRSG DERMACEA 8X12 NADH (GAUZE/BANDAGES/DRESSINGS) ×2 IMPLANT
DRSG MEPILEX SACRM 8.7X9.8 (GAUZE/BANDAGES/DRESSINGS) ×2 IMPLANT
DRSG OPSITE POSTOP 4X14 (GAUZE/BANDAGES/DRESSINGS) ×2 IMPLANT
DRSG TEGADERM 4X4.75 (GAUZE/BANDAGES/DRESSINGS) ×2 IMPLANT
DURAPREP 26ML APPLICATOR (WOUND CARE) ×4 IMPLANT
ELECT CAUTERY BLADE 6.4 (BLADE) ×2 IMPLANT
ELECT REM PT RETURN 9FT ADLT (ELECTROSURGICAL) ×2
ELECTRODE REM PT RTRN 9FT ADLT (ELECTROSURGICAL) ×1 IMPLANT
EX-PIN ORTHOLOCK NAV 4X150 (PIN) ×4 IMPLANT
GLOVE SRG 8 PF TXTR STRL LF DI (GLOVE) ×1 IMPLANT
GLOVE SURG ENC TEXT LTX SZ7.5 (GLOVE) ×8 IMPLANT
GLOVE SURG UNDER POLY LF SZ7.5 (GLOVE) ×2 IMPLANT
GLOVE SURG UNDER POLY LF SZ8 (GLOVE) ×1
GOWN STRL REUS W/ TWL LRG LVL3 (GOWN DISPOSABLE) ×2 IMPLANT
GOWN STRL REUS W/ TWL XL LVL3 (GOWN DISPOSABLE) ×1 IMPLANT
GOWN STRL REUS W/TWL LRG LVL3 (GOWN DISPOSABLE) ×2
GOWN STRL REUS W/TWL XL LVL3 (GOWN DISPOSABLE) ×1
HEMOVAC 400CC 10FR (MISCELLANEOUS) ×2 IMPLANT
HOLDER FOLEY CATH W/STRAP (MISCELLANEOUS) ×2 IMPLANT
HOLSTER ELECTROSUGICAL PENCIL (MISCELLANEOUS) ×2 IMPLANT
IV NS IRRIG 3000ML ARTHROMATIC (IV SOLUTION) ×2 IMPLANT
KIT TURNOVER KIT A (KITS) ×2 IMPLANT
KNIFE SCULPS 14X20 (INSTRUMENTS) ×2 IMPLANT
LABEL OR SOLS (LABEL) ×1 IMPLANT
MANIFOLD NEPTUNE II (INSTRUMENTS) ×4 IMPLANT
NDL SAFETY ECLIPSE 18X1.5 (NEEDLE) ×1 IMPLANT
NDL SPNL 20GX3.5 QUINCKE YW (NEEDLE) ×2 IMPLANT
NEEDLE HYPO 18GX1.5 SHARP (NEEDLE) ×1
NEEDLE SPNL 20GX3.5 QUINCKE YW (NEEDLE) ×4 IMPLANT
NS IRRIG 500ML POUR BTL (IV SOLUTION) ×2 IMPLANT
PACK TOTAL KNEE (MISCELLANEOUS) ×2 IMPLANT
PAD ABD DERMACEA PRESS 5X9 (GAUZE/BANDAGES/DRESSINGS) ×4 IMPLANT
PAD WRAPON POLAR KNEE (MISCELLANEOUS) ×1 IMPLANT
PENCIL SMOKE EVACUATOR COATED (MISCELLANEOUS) ×2 IMPLANT
PIN DRILL FIX HALF THREAD (BIT) ×4 IMPLANT
PIN FIXATION 1/8DIA X 3INL (PIN) ×2 IMPLANT
PULSAVAC PLUS IRRIG FAN TIP (DISPOSABLE) ×2
SOL PREP PVP 2OZ (MISCELLANEOUS) ×2
SOLUTION PREP PVP 2OZ (MISCELLANEOUS) ×1 IMPLANT
SOLUTION PRONTOSAN WOUND 350ML (IRRIGATION / IRRIGATOR) ×2 IMPLANT
SPONGE DRAIN TRACH 4X4 STRL 2S (GAUZE/BANDAGES/DRESSINGS) ×2 IMPLANT
STAPLER SKIN PROX 35W (STAPLE) ×2 IMPLANT
STOCKINETTE IMPERV 14X48 (MISCELLANEOUS) IMPLANT
STRAP TIBIA SHORT (MISCELLANEOUS) ×2 IMPLANT
SUCTION FRAZIER HANDLE 10FR (MISCELLANEOUS) ×1
SUCTION TUBE FRAZIER 10FR DISP (MISCELLANEOUS) ×1 IMPLANT
SUT VIC AB 0 CT1 36 (SUTURE) ×4 IMPLANT
SUT VIC AB 1 CT1 36 (SUTURE) ×4 IMPLANT
SUT VIC AB 2-0 CT2 27 (SUTURE) ×2 IMPLANT
SYR 20ML LL LF (SYRINGE) ×2 IMPLANT
SYR 30ML LL (SYRINGE) ×4 IMPLANT
TIBIA ATTUNE KNEE SYS BASE SZ6 (Knees) ×2 IMPLANT
TIP FAN IRRIG PULSAVAC PLUS (DISPOSABLE) ×1 IMPLANT
TOWEL OR 17X26 4PK STRL BLUE (TOWEL DISPOSABLE) ×1 IMPLANT
TOWER CARTRIDGE SMART MIX (DISPOSABLE) ×1 IMPLANT
TRAY FOLEY MTR SLVR 16FR STAT (SET/KITS/TRAYS/PACK) ×2 IMPLANT
WATER STERILE IRR 1000ML POUR (IV SOLUTION) ×2 IMPLANT
WATER STERILE IRR 500ML POUR (IV SOLUTION) ×1 IMPLANT
WRAPON POLAR PAD KNEE (MISCELLANEOUS) ×2

## 2021-04-05 NOTE — Op Note (Signed)
OPERATIVE NOTE  DATE OF SURGERY:  04/05/2021  PATIENT NAME:  Michelle Montgomery   DOB: 1950-08-15  MRN: [id number]  PRE-OPERATIVE DIAGNOSIS: Degenerative arthrosis of the right knee, primary  POST-OPERATIVE DIAGNOSIS:  Same  PROCEDURE:  Right total knee arthroplasty using computer-assisted navigation  SURGEON:  Marciano Sequin. M.D.  ANESTHESIA: spinal  ESTIMATED BLOOD LOSS: 50 mL  FLUIDS REPLACED: 1900 mL of crystalloid  TOURNIQUET TIME: 105 minutes  DRAINS: 2 medium Hemovac drains  SOFT TISSUE RELEASES: Anterior cruciate ligament, posterior cruciate ligament, deep medial collateral ligament, patellofemoral ligament  IMPLANTS UTILIZED: DePuy Attune size 6 posterior stabilized femoral component (cemented), size 6 rotating platform tibial component (cemented), 41 mm medialized dome patella (cemented), and a 12 mm stabilized rotating platform polyethylene insert.  INDICATIONS FOR SURGERY: Michelle Montgomery is a 71 y.o. year old female with a long history of progressive knee pain. X-rays demonstrated severe degenerative changes in tricompartmental fashion. The patient had not seen any significant improvement despite conservative nonsurgical intervention. After discussion of the risks and benefits of surgical intervention, the patient expressed understanding of the risks benefits and agree with plans for total knee arthroplasty.   The risks, benefits, and alternatives were discussed at length including but not limited to the risks of infection, bleeding, nerve injury, stiffness, blood clots, the need for revision surgery, cardiopulmonary complications, among others, and they were willing to proceed.  PROCEDURE IN DETAIL: The patient was brought into the operating room and, after adequate spinal anesthesia was achieved, a tourniquet was placed on the patient's upper thigh. The patient's knee and leg were cleaned and prepped with alcohol and DuraPrep and draped in the usual sterile  fashion. A "timeout" was performed as per usual protocol. The lower extremity was exsanguinated using an Esmarch, and the tourniquet was inflated to 300 mmHg. An anterior longitudinal incision was made followed by a standard mid vastus approach. The deep fibers of the medial collateral ligament were elevated in a subperiosteal fashion off of the medial flare of the tibia so as to maintain a continuous soft tissue sleeve. The patella was subluxed laterally and the patellofemoral ligament was incised. Inspection of the knee demonstrated severe degenerative changes with full-thickness loss of articular cartilage. Osteophytes were debrided using a rongeur. Anterior and posterior cruciate ligaments were excised. Two 4.0 mm Schanz pins were inserted in the femur and into the tibia for attachment of the array of trackers used for computer-assisted navigation. Hip center was identified using a circumduction technique. Distal landmarks were mapped using the computer. The distal femur and proximal tibia were mapped using the computer. The distal femoral cutting guide was positioned using computer-assisted navigation so as to achieve a 5 distal valgus cut. The femur was sized and it was felt that a size 6 femoral component was appropriate. A size 6 femoral cutting guide was positioned and the anterior cut was performed and verified using the computer. This was followed by completion of the posterior and chamfer cuts. Femoral cutting guide for the central box was then positioned in the center box cut was performed.  Attention was then directed to the proximal tibia. Medial and lateral menisci were excised. The extramedullary tibial cutting guide was positioned using computer-assisted navigation so as to achieve a 0 varus-valgus alignment and 3 posterior slope. The cut was performed and verified using the computer. The proximal tibia was sized and it was felt that a size 6 tibial tray was appropriate. Tibial and femoral  trials were inserted followed  by insertion of a 12 mm polyethylene insert. This allowed for excellent mediolateral soft tissue balancing both in flexion and in full extension. Finally, the patella was cut and prepared so as to accommodate a 41 mm medialized dome patella. A patella trial was placed and the knee was placed through a range of motion with excellent patellar tracking appreciated. The femoral trial was removed after debridement of posterior osteophytes. The central post-hole for the tibial component was reamed followed by insertion of a keel punch. Tibial trials were then removed. Cut surfaces of bone were irrigated with copious amounts of normal saline using pulsatile lavage and then suctioned dry. Polymethylmethacrylate cement with gentamicin was prepared in the usual fashion using a vacuum mixer. Cement was applied to the cut surface of the proximal tibia as well as along the undersurface of a size 6 rotating platform tibial component. Tibial component was positioned and impacted into place. Excess cement was removed using [employer] fast streamer. Cement was then applied to the cut surfaces of the femur as well as along the posterior flanges of the size 6 femoral component. The femoral component was positioned and impacted into place. Excess cement was removed using [employer] fast streamer. A 12 mm polyethylene trial was inserted and the knee was brought into full extension with steady axial compression applied. Finally, cement was applied to the backside of a 41 mm medialized dome patella and the patellar component was positioned and patellar clamp applied. Excess cement was removed using [employer] fast streamer. After adequate curing of the cement, the tourniquet was deflated after a total tourniquet time of 105 minutes. Hemostasis was achieved using electrocautery. The knee was irrigated with copious amounts of normal saline using pulsatile lavage followed by 350 ml of Prontosan and then suctioned dry. 20 mL of 1.3%  Exparel and 60 mL of 0.25% Marcaine in 40 mL of normal saline was injected along the posterior capsule, medial and lateral gutters, and along the arthrotomy site. A 12 mm stabilized rotating platform polyethylene insert was inserted and the knee was placed through a range of motion with excellent mediolateral soft tissue balancing appreciated and excellent patellar tracking noted. 2 medium drains were placed in the wound bed and brought out through separate stab incisions. The medial parapatellar portion of the incision was reapproximated using interrupted sutures of #1 Vicryl. Subcutaneous tissue was approximated in layers using first #0 Vicryl followed #2-0 Vicryl. The skin was approximated with skin staples. A sterile dressing was applied.  The patient tolerated the procedure well and was transported to the recovery room in stable condition.    Brieanne Mignone P. Holley Bouche., M.D.

## 2021-04-05 NOTE — Transfer of Care (Signed)
Immediate Anesthesia Transfer of Care Note  Patient: Michelle Montgomery  Procedure(s) Performed: COMPUTER ASSISTED TOTAL KNEE ARTHROPLASTY (Right: Knee)  Patient Location: PACU  Anesthesia Type:Spinal  Level of Consciousness: awake and alert   Airway & Oxygen Therapy: Patient Spontanous Breathing and Patient connected to face mask oxygen  Post-op Assessment: Report given to RN and Post -op Vital signs reviewed and stable  Post vital signs:    Last Vitals:  Vitals Value Taken Time  BP    Temp    Pulse    Resp    SpO2      Last Pain:  Vitals:   04/05/21 4827  TempSrc: Temporal  PainSc: 0-No pain         Complications: No notable events documented.

## 2021-04-05 NOTE — Anesthesia Preprocedure Evaluation (Addendum)
Anesthesia Evaluation  Patient identified by MRN, date of birth, ID band Patient awake    Reviewed: Allergy & Precautions, NPO status , Patient's Chart, lab work & pertinent test results, reviewed documented beta blocker date and time   Airway Mallampati: III  TM Distance: >3 FB Neck ROM: full    Dental no notable dental hx.    Pulmonary neg pulmonary ROS, former smoker,    Pulmonary exam normal        Cardiovascular hypertension, Pt. on home beta blockers Normal cardiovascular exam+ dysrhythmias   Sinus rhythm with 1st degree A-V block Right bundle branch block Abnormal ECG No previous ECGs available Confirmed by Florence Canner [phone]), editor Dwaine Deter [phone] on 03/27/2021 10:59:55 AM   Neuro/Psych  Neuromuscular disease (h/o sciatica) negative psych ROS   GI/Hepatic Neg liver ROS, GERD  Controlled and Medicated,  Endo/Other  diabetes (a1c 7.1), Type 2, Oral Hypoglycemic Agents  Renal/GU      Musculoskeletal  (+) Arthritis ,   Abdominal (+) + obese,   Peds  Hematology negative hematology ROS (+)   Anesthesia Other Findings Past Medical History: No date: Arthritis No date: Arthrosis No date: Bilateral leg cramps No date: Bilateral sciatica No date: Cancer (HCC) No date: Chronic insomnia No date: Diabetes mellitus without complication (HCC) No date: Dyslipidemia No date: History of colon polyps No date: History of skin cancer     Comment:  Dr. Ave Filter, history of basal cell and squamous               cells carcinoma No date: Hypertension No date: Microalbuminuria     Comment:  100-DM No date: Obesity No date: Plantar fasciitis, left     Comment:  Dr. Milinda Pointer No date: Reflux No date: Right bundle branch block  Past Surgical History: No date: achiles tendon; Left 2015: BREAST BIOPSY; Left     Comment:  benign 08/29/2011: FOOT SURGERY; Left     Comment:  Spur Removal , and Achilles Tendon  Tendolysis No date: VARICOSE VEIN SURGERY; Bilateral  BMI    Body Mass Index: 33.72 kg/m      Reproductive/Obstetrics negative OB ROS                            Anesthesia Physical Anesthesia Plan  ASA: 2  Anesthesia Plan: Spinal   Post-op Pain Management: Ofirmev IV (intra-op)*, Gabapentin PO (pre-op)* and Celebrex PO (pre-op)*   Induction:   PONV Risk Score and Plan:   Airway Management Planned: Nasal Cannula and Natural Airway  Additional Equipment:   Intra-op Plan:   Post-operative Plan:   Informed Consent: I have reviewed the patients History and Physical, chart, labs and discussed the procedure including the risks, benefits and alternatives for the proposed anesthesia with the patient or authorized representative who has indicated his/her understanding and acceptance.     Dental Advisory Given  Plan Discussed with: Anesthesiologist, CRNA and Surgeon  Anesthesia Plan Comments:        Anesthesia Quick Evaluation

## 2021-04-05 NOTE — Anesthesia Procedure Notes (Addendum)
Spinal  Patient location during procedure: OR Start time: 04/05/2021 8:20 AM End time: 04/05/2021 8:26 AM Reason for block: surgical anesthesia Preanesthetic Checklist Completed: patient identified, IV checked, site marked, risks and benefits discussed, surgical consent, monitors and equipment checked, pre-op evaluation and timeout performed Spinal Block Patient position: sitting Prep: DuraPrep Patient monitoring: heart rate, cardiac monitor, continuous pulse ox and blood pressure Approach: midline Location: L3-4 Injection technique: single-shot Needle Needle type: Sprotte  Needle gauge: 24 G Needle length: 9 cm Assessment Sensory level: T4 Events: CSF return

## 2021-04-05 NOTE — H&P (Signed)
The patient has been re-examined, and the chart reviewed, and there have been no interval changes to the documented history and physical.    The risks, benefits, and alternatives have been discussed at length. The patient expressed understanding of the risks benefits and agreed with plans for surgical intervention.  Teresia Myint P. Agnieszka Newhouse, Jr. M.D.    

## 2021-04-05 NOTE — Evaluation (Signed)
Physical Therapy Evaluation Patient Details Name: Michelle Montgomery MRN: [id number] DOB: 01-28-1951 Today's Date: 04/05/2021  History of Present Illness  admitted for acute hospitalization s/p R TKR, WBAT (04/05/21)  Clinical Impression  Patient resting in bed upon arrival to room; alert and oriented, follows commands and agreeable to OOB.  Rates R knee pain 4/10; meds requested/received during session.  Fair/good R knee strength (3-/5) and ROM (5-85 degrees) noted post-op; fair/good activation and control with isolated therex.  Completes R SLR without difficulty.  Able to complete bed mobility with min assist; sit/stand, basic transfers (bed/chair, bed/BSC) with RW, cga/min assist.  Good R knee control and stability in loading; tolerates WBing with minimal increase in pain.  Lunch tray arrived during session; patient wishing to eat.  Will continue to assess/progress gait in next session.  Do anticipate excellent participation and progress towards goals. Would benefit from skilled PT to address above deficits and promote optimal return to PLOF.; Recommend transition to HHPT upon discharge from acute hospitalization.        Recommendations for follow up therapy are one component of a multi-disciplinary discharge planning process, led by the attending physician.  Recommendations may be updated based on patient status, additional functional criteria and insurance authorization.  Follow Up Recommendations Home health PT    Assistance Recommended at Discharge PRN  Patient can return home with the following  A little help with walking and/or transfers;A little help with bathing/dressing/bathroom    Equipment Recommendations    Recommendations for Other Services       Functional Status Assessment Patient has had a recent decline in their functional status and demonstrates the ability to make significant improvements in function in a reasonable and predictable amount of time.     Precautions /  Restrictions Precautions Precautions: Fall Restrictions Weight Bearing Restrictions: Yes RLE Weight Bearing: Weight bearing as tolerated      Mobility  Bed Mobility Overal bed mobility: Needs Assistance Bed Mobility: Supine to Sit     Supine to sit: Min assist          Transfers Overall transfer level: Needs assistance Equipment used: Rolling walker (2 wheels) Transfers: Sit to/from Stand, Bed to chair/wheelchair/BSC Sit to Stand: Min guard, Min assist Stand pivot transfers: Min guard, Min assist         General transfer comment: Good R LE knee control in loading; good ease and stability of movement    Ambulation/Gait               General Gait Details: deferred as lunch tray arrived; patient wishing to eat  Stairs            Wheelchair Mobility    Modified Rankin (Stroke Patients Only)       Balance Overall balance assessment: Needs assistance Sitting-balance support: No upper extremity supported, Feet supported Sitting balance-Leahy Scale: Good     Standing balance support: Bilateral upper extremity supported Standing balance-Leahy Scale: Fair                               Pertinent Vitals/Pain Pain Assessment Pain Assessment: 0-10 Pain Score: 4  Pain Location: R knee Pain Descriptors / Indicators: Grimacing, Sore Pain Intervention(s): Limited activity within patient's tolerance, Monitored during session, Repositioned, Patient requesting pain meds-RN notified, RN gave pain meds during session    Home Living Family/patient expects to be discharged to:: Private residence Living Arrangements: Spouse/significant other Available Help at  Discharge: Family Type of Home: House Home Access:  (can enter through the back, threshold only)       Home Layout: One level Home Equipment: Conservation officer, nature (2 wheels);BSC/3in1      Prior Function Prior Level of Function : Independent/Modified Independent             Mobility  Comments: Indep wiht ADLs, hosehold and community mobiliztion without assist device; denies fall history       Hand Dominance   Dominant Hand: Right    Extremity/Trunk Assessment   Upper Extremity Assessment Upper Extremity Assessment: Overall WFL for tasks assessed    Lower Extremity Assessment Lower Extremity Assessment: Generalized weakness (R knee grossly 3-5, limited by pain and post-op dressing)       Communication   Communication: No difficulties  Cognition Arousal/Alertness: Awake/alert Behavior During Therapy: WFL for tasks assessed/performed Overall Cognitive Status: Within Functional Limits for tasks assessed                                          General Comments      Exercises Total Joint Exercises Goniometric ROM: R knee: 5-85 degrees Other Exercises Other Exercises: Reviewed role of PT and progressive mobility, WBing recommendations, use of RW and safety needs; patient voiced understanding. Other Exercises: R LE supine therex, 1x10, active assist ROM: ankle pumps, quad sets, SAQs, hel slides, SLR.  Good R LE control and activation with supine therex.   Assessment/Plan    PT Assessment Patient needs continued PT services  PT Problem List Decreased strength;Decreased range of motion;Decreased activity tolerance;Decreased balance;Decreased mobility;Decreased knowledge of use of DME;Decreased safety awareness;Decreased knowledge of precautions;Decreased skin integrity;Pain       PT Treatment Interventions DME instruction;Therapeutic activities;Cognitive remediation;Gait training;Therapeutic exercise;Patient/family education;Stair training;Balance training;Functional mobility training    PT Goals (Current goals can be found in the Care Plan section)  Acute Rehab PT Goals Patient Stated Goal: to return home PT Goal Formulation: With patient/family Time For Goal Achievement: 04/19/21 Potential to Achieve Goals: Good    Frequency BID      Co-evaluation               AM-PAC PT "6 Clicks" Mobility  Outcome Measure Help needed turning from your back to your side while in a flat bed without using bedrails?: None Help needed moving from lying on your back to sitting on the side of a flat bed without using bedrails?: None Help needed moving to and from a bed to a chair (including a wheelchair)?: A Little Help needed standing up from a chair using your arms (e.g., wheelchair or bedside chair)?: A Little Help needed to walk in hospital room?: A Little Help needed climbing 3-5 steps with a railing? : A Little 6 Click Score: 20    End of Session   Activity Tolerance: Patient tolerated treatment well Patient left: in chair;with call bell/phone within reach;with chair alarm set;with family/visitor present;with nursing/sitter in room Nurse Communication: Mobility status PT Visit Diagnosis: Muscle weakness (generalized) (M62.81);Other abnormalities of gait and mobility (R26.89);Pain Pain - Right/Left: Right Pain - part of body: Knee    Time: 0932-3557 PT Time Calculation (min) (ACUTE ONLY): 42 min   Charges:   PT Evaluation $PT Eval Moderate Complexity: 1 Mod PT Treatments $Therapeutic Exercise: 8-22 mins $Therapeutic Activity: 8-22 mins        Virdie Penning H. Owens Shark, PT, DPT, NCS  04/05/21, 9:53 PM [phone]

## 2021-04-05 NOTE — Anesthesia Postprocedure Evaluation (Signed)
Anesthesia Post Note  Patient: Michelle Montgomery  Procedure(s) Performed: COMPUTER ASSISTED TOTAL KNEE ARTHROPLASTY (Right: Knee)  Patient location during evaluation: PACU Anesthesia Type: Spinal Level of consciousness: awake and alert Pain management: pain level controlled Vital Signs Assessment: post-procedure vital signs reviewed and stable Respiratory status: spontaneous breathing, nonlabored ventilation and respiratory function stable Cardiovascular status: blood pressure returned to baseline and stable Postop Assessment: no apparent nausea or vomiting Anesthetic complications: no   No notable events documented.   Last Vitals:  Vitals:   04/05/21 1345 04/05/21 1500  BP: 131/65 (!) 142/75  Pulse: 74 71  Resp: 20 18  Temp: 36.6 C 36.7 C  SpO2: 97% 91%    Last Pain:  Vitals:   04/05/21 1500  TempSrc: Oral  PainSc:                  [place] Ouch

## 2021-04-05 NOTE — TOC Initial Note (Signed)
Transition of Care [facility]) - Initial/Assessment Note    Patient Details  Name: Michelle Montgomery MRN: [id number] Date of Birth: 08/13/1950  [facility] of Care [facility]) CM/SW Contact:    Conception Oms, RN Phone Number: 04/05/2021, 3:18 PM  Clinical Narrative:      [facility] is set up for home health PT to eval and will make recommendation                   Patient Goals and CMS Choice        Expected Discharge Plan and Services                                                Prior Living Arrangements/Services                       Activities of Daily Living      Permission Sought/Granted                  Emotional Assessment              Admission diagnosis:  Total knee replacement status [Z96.659] Patient Active Problem List   Diagnosis Date Noted   Total knee replacement status 04/05/2021   Primary osteoarthritis of both knees 03/11/2021   Varicose veins of both lower extremities 06/12/2016   BPV (benign positional vertigo), right 08/24/2015   Smell disturbance 01/16/2015   Osteoarthritis 11/03/2014   Cramps of lower extremity 11/03/2014   Neuralgia neuritis, sciatic nerve 11/03/2014   Insomnia, persistent 11/03/2014   Dyslipidemia 11/03/2014   Type 2 diabetes with nephropathy ([place]) 11/03/2014   Essential (primary) hypertension 11/03/2014   History of colon polyps 11/03/2014   H/O malignant neoplasm of skin 11/03/2014   Microalbuminuria 11/03/2014   Osteopenia 11/03/2014   Plantar fasciitis 11/03/2014   Gastro-esophageal reflux disease without esophagitis 11/03/2014   Bundle branch block, right 11/03/2014   PCP:  Steele Sizer, MD Pharmacy:   [facility] [place] [facility] [address] [place] [address] Phone: [phone] Fax: [facility] [name], [place] [address] Phone: [phone] Fax: [phone]     Social Determinants of Health (SDOH) Interventions    Readmission Risk Interventions No flowsheet data found.

## 2021-04-05 NOTE — Progress Notes (Signed)
Patient is doing great, notified patient husband that patient is in the room, ROOM # [id number]. [name] was able to talk to her husband on the phone.

## 2021-04-06 DIAGNOSIS — R531 Weakness: Secondary | ICD-10-CM | POA: Diagnosis not present

## 2021-04-06 DIAGNOSIS — Z87891 Personal history of nicotine dependence: Secondary | ICD-10-CM | POA: Diagnosis not present

## 2021-04-06 DIAGNOSIS — I10 Essential (primary) hypertension: Secondary | ICD-10-CM | POA: Diagnosis not present

## 2021-04-06 DIAGNOSIS — Z7984 Long term (current) use of oral hypoglycemic drugs: Secondary | ICD-10-CM | POA: Diagnosis not present

## 2021-04-06 DIAGNOSIS — M1711 Unilateral primary osteoarthritis, right knee: Secondary | ICD-10-CM | POA: Diagnosis not present

## 2021-04-06 DIAGNOSIS — E119 Type 2 diabetes mellitus without complications: Secondary | ICD-10-CM | POA: Diagnosis not present

## 2021-04-06 DIAGNOSIS — Z7982 Long term (current) use of aspirin: Secondary | ICD-10-CM | POA: Diagnosis not present

## 2021-04-06 DIAGNOSIS — Z859 Personal history of malignant neoplasm, unspecified: Secondary | ICD-10-CM | POA: Diagnosis not present

## 2021-04-06 DIAGNOSIS — Z79899 Other long term (current) drug therapy: Secondary | ICD-10-CM | POA: Diagnosis not present

## 2021-04-06 LAB — GLUCOSE, CAPILLARY
Glucose-Capillary: 189 mg/dL — ABNORMAL HIGH (ref 70–99)
Glucose-Capillary: 184 mg/dL — ABNORMAL HIGH (ref 70–99)

## 2021-04-06 MED ORDER — CELECOXIB 200 MG PO CAPS: 200.0000 mg | ORAL_CAPSULE | Freq: Two times a day (BID) | ORAL | 0 refills | Status: DC

## 2021-04-06 MED ORDER — ENOXAPARIN SODIUM 40 MG/0.4ML IJ SOSY: 40.0000 mg | PREFILLED_SYRINGE | INTRAMUSCULAR | 0 refills | Status: DC

## 2021-04-06 MED ORDER — ACETAMINOPHEN 10 MG/ML IV SOLN
1000.0000 mg | Freq: Four times a day (QID) | INTRAVENOUS | Status: DC
  Administered 2021-04-06: 1000 mg via INTRAVENOUS

## 2021-04-06 MED ORDER — TRAMADOL HCL 50 MG PO TABS: 50.0000 mg | ORAL_TABLET | ORAL | 0 refills | Status: DC | PRN

## 2021-04-06 MED ORDER — OXYCODONE HCL 5 MG PO TABS: 5.0000 mg | ORAL_TABLET | ORAL | 0 refills | Status: DC | PRN

## 2021-04-06 NOTE — Progress Notes (Signed)
°  Subjective: 1 Day Post-Op Procedure(s) (LRB): COMPUTER ASSISTED TOTAL KNEE ARTHROPLASTY (Right) Patient reports pain as mild.   Patient is well, and has had no acute complaints or problems Plan is to go Home after hospital stay. Negative for chest pain and shortness of breath Fever: no Gastrointestinal:Negative for nausea and vomiting Patient reports that she is passing gas without pain this morning.  Objective: Vital signs in last 24 hours: Temp:  [97.7 F (36.5 C)-98 F (36.7 C)] 97.7 F (36.5 C) (02/24 2028) Pulse Rate:  [60-74] 60 (02/24 2028) Resp:  [14-20] 17 (02/24 2028) BP: (103-142)/(65-75) 103/75 (02/24 2028) SpO2:  [91 %-100 %] 95 % (02/24 2028)  Intake/Output from previous day:  Intake/Output Summary (Last 24 hours) at 04/06/2021 0917 Last data filed at 04/06/2021 0730 Gross per 24 hour  Intake 2485.43 ml  Output 1350 ml  Net 1135.43 ml    Intake/Output this shift: Total I/O In: -  Out: 300 [Urine:300]  Labs: No results for input(s): HGB in the last 72 hours. No results for input(s): WBC, RBC, HCT, PLT in the last 72 hours. No results for input(s): NA, K, CL, CO2, BUN, CREATININE, GLUCOSE, CALCIUM in the last 72 hours. No results for input(s): LABPT, INR in the last 72 hours.   EXAM General - Patient is Alert, Appropriate, and Oriented Extremity - ABD soft Neurovascular intact Dorsiflexion/Plantar flexion intact Incision: dressing C/D/I No cellulitis present Dressing/Incision - Bulky dressing intact to the right leg this morning with hemovac intact with mild bloody drainage.  Bulky dressing was removed and hemovac was pulled without complication.  4x4 with tegaderm applied, honeycomb dressing without any drainage. Motor Function - intact, moving foot and toes well on exam.  Abdomen soft with normal bowel sounds.  Past Medical History:  Diagnosis Date   Arthritis    Arthrosis    Bilateral leg cramps    Bilateral sciatica    Cancer ([place])     Chronic insomnia    Diabetes mellitus without complication (HCC)    Dyslipidemia    History of colon polyps    History of skin cancer    Dr. Ave Filter, history of basal cell and squamous cells carcinoma   Hypertension    Microalbuminuria    100-DM   Obesity    Plantar fasciitis, left    Dr. Milinda Pointer   Reflux    Right bundle branch block     Assessment/Plan: 1 Day Post-Op Procedure(s) (LRB): COMPUTER ASSISTED TOTAL KNEE ARTHROPLASTY (Right) Principal Problem:   Total knee replacement status  Estimated body mass index is 33.72 kg/m as calculated from the following:   Height as of this encounter: 5\' 10"  (1.778 m).   Weight as of this encounter: 106.6 kg. Advance diet Up with therapy D/C IV fluids when tolerating po intake.  Vitals stable this morning. Bulky dressing removed, hemovac removed this morning. Up with therapy today.  Patient is passing gas this morning. Plan for d/c home this afternoon pending progress with PT.  DVT Prophylaxis - Lovenox, TED hose, and SCDs Weight-Bearing as tolerated to right leg  J. Cameron Proud, PA-C [facility] 04/06/2021, 9:17 AM

## 2021-04-06 NOTE — Discharge Summary (Signed)
Physician Discharge Summary  Patient ID: Michelle Montgomery MRN: [id number] DOB/AGE: 1950/06/03 71 y.o.  Admit date: 04/05/2021 Discharge date: 04/06/2021  Admission Diagnoses:  Total knee replacement status [Z96.659]  Surgeries:Procedure(s): Right total knee arthroplasty using computer-assisted navigation   SURGEON:  Michelle Montgomery. M.D.   ANESTHESIA: spinal   ESTIMATED BLOOD LOSS: 50 mL   FLUIDS REPLACED: 1900 mL of crystalloid   TOURNIQUET TIME: 105 minutes   DRAINS: 2 medium Hemovac drains   SOFT TISSUE RELEASES: Anterior cruciate ligament, posterior cruciate ligament, deep medial collateral ligament, patellofemoral ligament   IMPLANTS UTILIZED: DePuy Attune size 6 posterior stabilized femoral component (cemented), size 6 rotating platform tibial component (cemented), 41 mm medialized dome patella (cemented), and a 12 mm stabilized rotating platform polyethylene insert.  Discharge Diagnoses: Patient Active Problem List   Diagnosis Date Noted   Total knee replacement status 04/05/2021   Primary osteoarthritis of both knees 03/11/2021   Varicose veins of both lower extremities 06/12/2016   BPV (benign positional vertigo), right 08/24/2015   Smell disturbance 01/16/2015   Osteoarthritis 11/03/2014   Cramps of lower extremity 11/03/2014   Neuralgia neuritis, sciatic nerve 11/03/2014   Insomnia, persistent 11/03/2014   Dyslipidemia 11/03/2014   Type 2 diabetes with nephropathy ([place]) 11/03/2014   Essential (primary) hypertension 11/03/2014   History of colon polyps 11/03/2014   H/O malignant neoplasm of skin 11/03/2014   Microalbuminuria 11/03/2014   Osteopenia 11/03/2014   Plantar fasciitis 11/03/2014   Gastro-esophageal reflux disease without esophagitis 11/03/2014   Bundle branch block, right 11/03/2014    Past Medical History:  Diagnosis Date   Arthritis    Arthrosis    Bilateral leg cramps    Bilateral sciatica    Cancer ([place])    Chronic insomnia     Diabetes mellitus without complication ([place])    Dyslipidemia    History of colon polyps    History of skin cancer    Dr. Ave Montgomery, history of basal cell and squamous cells carcinoma   Hypertension    Microalbuminuria    100-DM   Obesity    Plantar fasciitis, left    Dr. Milinda Montgomery   Reflux    Right bundle branch block    Transfusion:    Consultants (if any):   Discharged Condition: Improved  Hospital Course: Michelle Montgomery is an 71 y.o. female who was admitted 04/05/2021 with a diagnosis of right knee osteoarthritis and went to the operating room on 04/05/2021 and underwent right total knee arthroplasty. The patient received perioperative antibiotics for prophylaxis (see below). The patient tolerated the procedure well and was transported to PACU in stable condition. After meeting PACU criteria, the patient was subsequently transferred to the Orthopaedics/Rehabilitation unit.   The patient received DVT prophylaxis in the form of early mobilization, Lovenox, TED hose, and SCDs . A sacral pad had been placed and heels were elevated off of the bed with rolled towels in order to protect skin integrity. Foley catheter was discontinued on postoperative day #0. Wound drains were discontinued on postoperative day #1. The surgical incision was healing well without signs of infection.  Physical therapy was initiated postoperatively for transfers, gait training, and strengthening. Occupational therapy was initiated for activities of daily living and evaluation for assisted devices. Rehabilitation goals were reviewed in detail with the patient. The patient made steady progress with physical therapy and physical therapy recommended discharge to Home.   The patient achieved the preliminary goals of this hospitalization and was felt to  be medically and orthopaedically appropriate for discharge.  She was given perioperative antibiotics:  Anti-infectives (From admission, onward)    Start      Dose/Rate Route Frequency Ordered Stop   04/05/21 1430  ceFAZolin (ANCEF) IVPB 2g/100 mL premix        2 g 200 mL/hr over 30 Minutes Intravenous Every 6 hours 04/05/21 1418 04/05/21 2054   04/05/21 0622  ceFAZolin (ANCEF) 2-4 GM/100ML-% IVPB       Note to Pharmacy: Michelle Montgomery F: cabinet override      04/05/21 0622 04/05/21 0848   04/05/21 0600  ceFAZolin (ANCEF) IVPB 2g/100 mL premix        2 g 200 mL/hr over 30 Minutes Intravenous On call to O.R. 04/05/21 0147 04/05/21 0831     .  Recent vital signs:  Vitals:   04/05/21 2028 04/06/21 0900  BP: 103/75 107/68  Pulse: 60 66  Resp: 17   Temp: 97.7 F (36.5 C) 98.7 F (37.1 C)  SpO2: 95% 95%    Recent laboratory studies:  No results for input(s): WBC, HGB, HCT, PLT, K, CL, CO2, BUN, CREATININE, GLUCOSE, CALCIUM, LABPT, INR in the last 72 hours.  Diagnostic Studies: DG Knee Right Port  Result Date: 04/05/2021 CLINICAL DATA:  Post RIGHT knee replacement EXAM: PORTABLE RIGHT KNEE - 1-2 VIEW COMPARISON:  Portable exam 1234 hours without priors for comparison FINDINGS: Osseous demineralization. Components of RIGHT knee prosthesis identified. No fracture, dislocation, or bone destruction. Surgical drain and anterior skin clips present. IMPRESSION: RIGHT knee prosthesis without acute complication. Electronically Signed   By: Michelle Montgomery M.D.   On: 04/05/2021 12:52    Discharge Medications:   Allergies as of 04/06/2021       Reactions   Codeine Nausea Only   Passed out   Penicillins Rash   IgE = 17 (WNL) on 04/02/2021        Medication List     STOP taking these medications    aspirin 81 MG tablet   meloxicam 15 MG tablet Commonly known as: MOBIC   naproxen sodium 220 MG tablet Commonly known as: ALEVE       TAKE these medications    amLODipine 5 MG tablet Commonly known as: NORVASC Take 1 tablet (5 mg total) by mouth daily.   atenolol-chlorthalidone 50-25 MG tablet Commonly known as: TENORETIC Take 1  tablet by mouth daily.   atorvastatin 40 MG tablet Commonly known as: LIPITOR Take 1 tablet (40 mg total) by mouth daily.   benazepril 40 MG tablet Commonly known as: LOTENSIN Take 1 tablet (40 mg total) by mouth daily.   blood glucose meter kit and supplies Dispense based on patient and insurance preference. Use up to four times daily as directed. (FOR ICD-10 E10.9, E11.9).   CALCIUM 600 + D PO Take 1 tablet by mouth 2 (two) times daily.   celecoxib 200 MG capsule Commonly known as: CELEBREX Take 1 capsule (200 mg total) by mouth 2 (two) times daily.   cetirizine 10 MG tablet Commonly known as: ZYRTEC Take 10 mg by mouth daily as needed for allergies.   Coenzyme Q10 300 MG Caps Take 300 mg by mouth every evening.   Deep Blue Relief Gel Apply 1 application topically daily as needed (pain).   diclofenac Sodium 1 % Gel Commonly known as: VOLTAREN Apply 1 application topically daily as needed (pain).   diphenhydramine-acetaminophen 25-500 MG Tabs tablet Commonly known as: TYLENOL PM Take 1 tablet by mouth at  bedtime as needed (sleep).   diphenhydramine-acetaminophen 25-500 MG Tabs tablet Commonly known as: TYLENOL PM Take 1 tablet by mouth at bedtime as needed.   enoxaparin 40 MG/0.4ML injection Commonly known as: LOVENOX Inject 0.4 mLs (40 mg total) into the skin daily for 14 days.   FIBER-CAPS PO Take 1 capsule by mouth daily.   Fish Oil Concentrate 1000 MG Caps Take 1,000 mg by mouth daily.   glipiZIDE 2.5 MG 24 hr tablet Commonly known as: GLUCOTROL XL Take 1 tablet (2.5 mg total) by mouth daily with breakfast.   Glucosamine Chondroitin Triple Tabs Take 1 tablet by mouth daily.   Melatonin 10 MG Tabs Take 10 mg by mouth at bedtime.   Melatonin 5 MG Chew Chew by mouth.   metFORMIN 750 MG 24 hr tablet Commonly known as: GLUCOPHAGE-XR Take 2 tablets (1,500 mg total) by mouth daily with breakfast. What changed:  how much to take when to take this    MULTIVITAMIN ADULT PO Take 1 tablet by mouth daily.   omeprazole 20 MG capsule Commonly known as: PRILOSEC Take 1 capsule (20 mg total) by mouth daily.   OVER THE COUNTER MEDICATION Take 1 capsule by mouth daily. Cinsulin otc supplement   OVER THE COUNTER MEDICATION Take 1 capsule by mouth daily. Feel good vita fruits and veggies   oxyCODONE 5 MG immediate release tablet Commonly known as: Oxy IR/ROXICODONE Take 1 tablet (5 mg total) by mouth every 4 (four) hours as needed for severe pain.   pioglitazone 30 MG tablet Commonly known as: ACTOS Take 1 tablet (30 mg total) by mouth daily.   psyllium 0.52 g capsule Commonly known as: REGULOID Take 0.52 g by mouth daily.   traMADol 50 MG tablet Commonly known as: ULTRAM Take 1 tablet (50 mg total) by mouth every 4 (four) hours as needed for moderate pain.   UNABLE TO FIND Take 1 capsule by mouth daily. Doterra Copaiba capsules for immunity and joint pain   vitamin C with rose hips 1000 MG tablet Take 1,000 mg by mouth daily.   Vitamin D 50 MCG (2000 UT) Caps Take 2,000 Units by mouth daily.   vitamin E 180 MG (400 UNITS) capsule Take 400 Units by mouth daily.               Durable Medical Equipment  (From admission, onward)           Start     Ordered   04/05/21 1418  DME Walker rolling  Once       Question:  Patient needs a walker to treat with the following condition  Answer:  Total knee replacement status   04/05/21 1418   04/05/21 1418  DME Bedside commode  Once       Question:  Patient needs a bedside commode to treat with the following condition  Answer:  Total knee replacement status   04/05/21 1418            Disposition: Home with home health PT     Follow-up Information     Fausto Skillern, PA-C Follow up in 14 day(s).   Specialty: Orthopedic Surgery Why: Staple Removal. Contact information: [place]  [place] [address] [phone]                  J. Cameron Proud, PA-C 04/06/2021, 9:24 AM

## 2021-04-06 NOTE — Progress Notes (Signed)
Nursing Discharge Note   Admit Date: 04/05/2021  Discharge date: 04/06/2021   Michelle Montgomery is to be discharged home per MD order.  AVS completed. Reviewed with patient and husband at bedside. Highlighted copy provided for patient to take home.  Patient and husband are able to verbalize understanding of discharge instructions. PIV removed. Patient stable upon discharge.   Provided patient with printed prescriptions for new medications. Educated patient and demonstrated how to inject Lovenox injections with AM dose of  Lovenox. Patient able to verbalize understanding. [employer] to bring rolling walker for patient to take home, awaiting delivery of rolling walker.   Allergies as of 04/06/2021       Reactions   Codeine Nausea Only   Passed out   Penicillins Rash   IgE = 17 (WNL) on 04/02/2021        Medication List     STOP taking these medications    aspirin 81 MG tablet   meloxicam 15 MG tablet Commonly known as: MOBIC   naproxen sodium 220 MG tablet Commonly known as: ALEVE       TAKE these medications    amLODipine 5 MG tablet Commonly known as: NORVASC Take 1 tablet (5 mg total) by mouth daily.   atenolol-chlorthalidone 50-25 MG tablet Commonly known as: TENORETIC Take 1 tablet by mouth daily.   atorvastatin 40 MG tablet Commonly known as: LIPITOR Take 1 tablet (40 mg total) by mouth daily.   benazepril 40 MG tablet Commonly known as: LOTENSIN Take 1 tablet (40 mg total) by mouth daily.   blood glucose meter kit and supplies Dispense based on patient and insurance preference. Use up to four times daily as directed. (FOR ICD-10 E10.9, E11.9).   CALCIUM 600 + D PO Take 1 tablet by mouth 2 (two) times daily.   celecoxib 200 MG capsule Commonly known as: CELEBREX Take 1 capsule (200 mg total) by mouth 2 (two) times daily.   cetirizine 10 MG tablet Commonly known as: ZYRTEC Take 10 mg by mouth daily as needed for allergies.   Coenzyme Q10 300 MG  Caps Take 300 mg by mouth every evening.   Deep Blue Relief Gel Apply 1 application topically daily as needed (pain).   diclofenac Sodium 1 % Gel Commonly known as: VOLTAREN Apply 1 application topically daily as needed (pain).   diphenhydramine-acetaminophen 25-500 MG Tabs tablet Commonly known as: TYLENOL PM Take 1 tablet by mouth at bedtime as needed (sleep).   diphenhydramine-acetaminophen 25-500 MG Tabs tablet Commonly known as: TYLENOL PM Take 1 tablet by mouth at bedtime as needed.   enoxaparin 40 MG/0.4ML injection Commonly known as: LOVENOX Inject 0.4 mLs (40 mg total) into the skin daily for 14 days.   FIBER-CAPS PO Take 1 capsule by mouth daily.   Fish Oil Concentrate 1000 MG Caps Take 1,000 mg by mouth daily.   glipiZIDE 2.5 MG 24 hr tablet Commonly known as: GLUCOTROL XL Take 1 tablet (2.5 mg total) by mouth daily with breakfast.   Glucosamine Chondroitin Triple Tabs Take 1 tablet by mouth daily.   Melatonin 10 MG Tabs Take 10 mg by mouth at bedtime.   Melatonin 5 MG Chew Chew by mouth.   metFORMIN 750 MG 24 hr tablet Commonly known as: GLUCOPHAGE-XR Take 2 tablets (1,500 mg total) by mouth daily with breakfast. What changed:  how much to take when to take this   MULTIVITAMIN ADULT PO Take 1 tablet by mouth daily.   omeprazole 20 MG capsule Commonly  known as: PRILOSEC Take 1 capsule (20 mg total) by mouth daily.   OVER THE COUNTER MEDICATION Take 1 capsule by mouth daily. Cinsulin otc supplement   OVER THE COUNTER MEDICATION Take 1 capsule by mouth daily. Feel good vita fruits and veggies   oxyCODONE 5 MG immediate release tablet Commonly known as: Oxy IR/ROXICODONE Take 1 tablet (5 mg total) by mouth every 4 (four) hours as needed for severe pain.   pioglitazone 30 MG tablet Commonly known as: ACTOS Take 1 tablet (30 mg total) by mouth daily.   psyllium 0.52 g capsule Commonly known as: REGULOID Take 0.52 g by mouth daily.    traMADol 50 MG tablet Commonly known as: ULTRAM Take 1 tablet (50 mg total) by mouth every 4 (four) hours as needed for moderate pain.   UNABLE TO FIND Take 1 capsule by mouth daily. Doterra Copaiba capsules for immunity and joint pain   vitamin C with rose hips 1000 MG tablet Take 1,000 mg by mouth daily.   Vitamin D 50 MCG (2000 UT) Caps Take 2,000 Units by mouth daily.   vitamin E 180 MG (400 UNITS) capsule Take 400 Units by mouth daily.               Durable Medical Equipment  (From admission, onward)           Start     Ordered   04/05/21 1418  DME Walker rolling  Once       Question:  Patient needs a walker to treat with the following condition  Answer:  Total knee replacement status   04/05/21 1418   04/05/21 1418  DME Bedside commode  Once       Question:  Patient needs a bedside commode to treat with the following condition  Answer:  Total knee replacement status   04/05/21 1418             Discharge Instructions/ Education: Discharge instructions given to patient/family with verbalized understanding. Discharge education completed with patient/family including: follow up instructions, medication list, discharge activities, and limitations if indicated. Patient escorted via wheelchair to lobby and discharged home via private automobile.

## 2021-04-06 NOTE — Progress Notes (Signed)
Physical Therapy Treatment Patient Details Name: Michelle Montgomery MRN: [id number] DOB: 1950-12-13 Today's Date: 04/06/2021   History of Present Illness admitted for acute hospitalization s/p R TKR, WBAT (04/05/21)    PT Comments    Pt was sitting in recliner pre/post session. She agrees to session and is cooperative and pleasant throughout. Easily able to stand and take steps to [facility] prior to standing and ambulating with RW 200 ft. No LOB or safety concerns . Was able to perform stairs safely without concerns. Returned to room and performed several ROM/there ex. AROM ~ 92 degrees flexion. Re-educated pt on importance of use of polar care and maintaining knee extension when in sitting/bed. Pt is overall progressing well and is cleared from an acute PT standpoint to DC home with HHPT to follow.    Recommendations for follow up therapy are one component of a multi-disciplinary discharge planning process, led by the attending physician.  Recommendations may be updated based on patient status, additional functional criteria and insurance authorization.  Follow Up Recommendations  Home health PT     Assistance Recommended at Discharge PRN  Patient can return home with the following A little help with walking and/or transfers;A little help with bathing/dressing/bathroom   Equipment Recommendations  Rolling walker (2 wheels)       Precautions / Restrictions Precautions Precautions: Fall Restrictions Weight Bearing Restrictions: Yes RLE Weight Bearing: Weight bearing as tolerated     Mobility  Bed Mobility    General bed mobility comments: in recliner pre/post session    Transfers Overall transfer level: Modified independent Equipment used: Rolling walker (2 wheels) Transfers: Sit to/from Stand Sit to Stand: Supervision    General transfer comment: Pt was easily and safely able to STS from recliner without physical assistance or difficulty    Ambulation/Gait Ambulation/Gait  assistance: Supervision Gait Distance (Feet): 200 Feet Assistive device: Rolling walker (2 wheels) Gait Pattern/deviations: Step-through pattern, Antalgic Gait velocity: decreased     General Gait Details: pt demonstrated safe ability to ambulate with RW   Stairs Stairs: Yes Stairs assistance: Supervision Stair Management: No rails, Backwards, Step to pattern, With walker Number of Stairs: 2 General stair comments: Pt was able to demonstrate safe ability to ascend/descend 2 stair with RW without physical assistance.   Balance Overall balance assessment: Needs assistance Sitting-balance support: No upper extremity supported, Feet supported Sitting balance-Leahy Scale: Good     Standing balance support: Bilateral upper extremity supported Standing balance-Leahy Scale: Good       Cognition Arousal/Alertness: Awake/alert Behavior During Therapy: WFL for tasks assessed/performed Overall Cognitive Status: Within Functional Limits for tasks assessed    General Comments: Pt is A and O x 4. Supportive spouse present throughout               Pertinent Vitals/Pain Pain Assessment Pain Assessment: 0-10 Pain Score: 4  Pain Location: R knee Pain Descriptors / Indicators: Grimacing, Sore Pain Intervention(s): Limited activity within patient's tolerance, Monitored during session, Premedicated before session, Repositioned, Ice applied     PT Goals (current goals can now be found in the care plan section) Acute Rehab PT Goals Patient Stated Goal: go home Progress towards PT goals: Progressing toward goals    Frequency    BID      PT Plan Current plan remains appropriate       AM-PAC PT "6 Clicks" Mobility   Outcome Measure  Help needed turning from your back to your side while in a flat bed without using  bedrails?: None Help needed moving from lying on your back to sitting on the side of a flat bed without using bedrails?: None Help needed moving to and from a bed  to a chair (including a wheelchair)?: A Little Help needed standing up from a chair using your arms (e.g., wheelchair or bedside chair)?: A Little Help needed to walk in hospital room?: A Little Help needed climbing 3-5 steps with a railing? : A Little 6 Click Score: 20    End of Session   Activity Tolerance: Patient tolerated treatment well Patient left: in chair;with call bell/phone within reach;with chair alarm set;with family/visitor present;with nursing/sitter in room Nurse Communication: Mobility status PT Visit Diagnosis: Muscle weakness (generalized) (M62.81);Other abnormalities of gait and mobility (R26.89);Pain Pain - Right/Left: Right Pain - part of body: Knee     Time: 0915-0940 PT Time Calculation (min) (ACUTE ONLY): 25 min  Charges:  $Gait Training: 8-22 mins $Therapeutic Exercise: 8-22 mins                     Julaine Fusi PTA 04/06/21, 9:59 AM

## 2021-04-06 NOTE — Care Management CC44 (Signed)
Condition Code 44 Documentation Completed  Patient Details  Name: Michelle Montgomery MRN: [id number] Date of Birth: [birth year]   Condition Code 44 given:  Yes Patient signature on Condition Code 44 notice:  Yes Documentation of 2 MD's agreement:  Yes Code 44 added to claim:  Yes    Taisha Pennebaker E Gauri Galvao, LCSW 04/06/2021, 9:15 AM

## 2021-04-06 NOTE — TOC Transition Note (Addendum)
Transition of Care [facility]) - CM/SW Discharge Note   Patient Details  Name: Michelle Montgomery MRN: [id number] Date of Birth: 1950-07-24  [facility] CM/SW Contact:  Magnus Ivan, LCSW Phone Number: 04/06/2021, 9:16 AM   Clinical Narrative:   Patient to DC home later today. Spoke to patient who confirmed she was arranged for [facility] with [facility] prior to admission by [employer].  Patient states she already has a walker and BSC at home. Patient denies needs.   9:55- Notified by PT patient needs a RW. The one she has does not have wheels. Referral made to [facility] with [employer]. Informed her patient is DC today.   12:40- Call to [facility] with [employer] to inquire status of RW delivery. She stated they are en route now.   1:55- Reached out to [facility] with [employer] to inform her the RW still has not been delivered. She is calling the delivery driver again. Informed [employer] Rep Thedore Mins and [facility] Supervisor Manuela Schwartz of extended delay.  2:15- ETA 10-20 minutes for RW. Notified Care Team.   Final next level of care: [place] Barriers to Discharge: Barriers Resolved   Patient Goals and CMS Choice Patient states their goals for this hospitalization and ongoing recovery are:: home with home health CMS Medicare.gov Compare Post Acute Care list provided to:: Patient Choice offered to / list presented to : Patient  Discharge Placement                    Patient and family notified of of transfer: 04/06/21  Discharge Plan and Services                            [place]: [place] Determinants of Health (SDOH) Interventions     Readmission Risk Interventions No flowsheet data found.

## 2021-04-06 NOTE — Evaluation (Signed)
Occupational Therapy Evaluation Patient Details Name: Michelle Montgomery MRN: [id number] DOB: [birth year] Today's Date: 04/06/2021   History of Present Illness admitted for acute hospitalization s/p R TKR, WBAT (04/05/21)   Clinical Impression   Pt seen for OT evaluation this date in setting of acute hospitalization s/p elective R TKR. She reports being INDEP at baseline including caring for her aging mother. She endorses being significantly limited by pain. She currently requires: MIN A for seated LB ADLs, MOD A for polar care and compression stocking mgt, SUPV with RW for ADL transfers and fxl mobility. OT ed with pt and spouse re: polar care mgt, compression stocking mgt, safe use of RW, LB ADL modifications. Pt and spouse with good understanding and OT issues a printed handout of education as well. Pt able to take steps to recliner with RW with SUPV, she requires one cue for controlling descent to sitting. She is left with all needs met and in reach in prep for meal time.      Recommendations for follow up therapy are one component of a multi-disciplinary discharge planning process, led by the attending physician.  Recommendations may be updated based on patient status, additional functional criteria and insurance authorization.   Follow Up Recommendations  No OT follow up    Assistance Recommended at Discharge Set up Supervision/Assistance  Patient can return home with the following A little help with bathing/dressing/bathroom;Assistance with cooking/housework;Assist for transportation;Help with stairs or ramp for entrance    Functional Status Assessment  Patient has had a recent decline in their functional status and demonstrates the ability to make significant improvements in function in a reasonable and predictable amount of time.  Equipment Recommendations  BSC/3in1    Recommendations for Other Services       Precautions / Restrictions Precautions Precautions:  Fall Restrictions Weight Bearing Restrictions: Yes RLE Weight Bearing: Weight bearing as tolerated      Mobility Bed Mobility Overal bed mobility: Needs Assistance Bed Mobility: Supine to Sit     Supine to sit: Supervision, HOB elevated     General bed mobility comments: increased time    Transfers Overall transfer level: Modified independent Equipment used: Rolling walker (2 wheels) Transfers: Sit to/from Stand Sit to Stand: Supervision           General transfer comment: minimal cueing requires to correctly use RW demonstrating good carryover from PT session      Balance Overall balance assessment: Needs assistance Sitting-balance support: No upper extremity supported, Feet supported Sitting balance-Leahy Scale: Good     Standing balance support: Bilateral upper extremity supported Standing balance-Leahy Scale: Good                             ADL either performed or assessed with clinical judgement   ADL                                         General ADL Comments: INDEP for UB ADLs, MIN A for seated LB ADLs, MOD A for polar care and compression stocking mgt, SUPV with RW for ADL transfers and fxl mobility.     Vision Patient Visual Report: No change from baseline       Perception     Praxis      Pertinent Vitals/Pain Pain Assessment Pain Assessment: 0-10 Pain Score: 4  Pain  Location: R knee Pain Descriptors / Indicators: Grimacing, Sore Pain Intervention(s): Limited activity within patient's tolerance, Monitored during session     Hand Dominance Right   Extremity/Trunk Assessment Upper Extremity Assessment Upper Extremity Assessment: Overall WFL for tasks assessed   Lower Extremity Assessment Lower Extremity Assessment:  (R knee flexion somewhat limited for LB ADLs, but at least able to obtain 90 degrees for appropriate sitting position)       Communication Communication Communication: No difficulties    Cognition Arousal/Alertness: Awake/alert Behavior During Therapy: WFL for tasks assessed/performed Overall Cognitive Status: Within Functional Limits for tasks assessed                                 General Comments: Pt is A and O x 4. Supportive spouse present throughout     General Comments       Exercises Other Exercises Other Exercises: OT ed re: role, compression stocking and polar care mgt, safe use of RW, LB ADL task modifications   Shoulder Instructions      Home Living Family/patient expects to be discharged to:: Private residence Living Arrangements: Spouse/significant other Available Help at Discharge: Family Type of Home: House Home Access:  (can enter through the back, threshold only)     Home Layout: One level               Home Equipment: Conservation officer, nature (2 wheels);BSC/3in1          Prior Functioning/Environment Prior Level of Function : Independent/Modified Independent             Mobility Comments: Indep wiht ADLs, hosehold and community mobiliztion without assist device; denies fall history          OT Problem List: Decreased range of motion;Decreased activity tolerance;Decreased knowledge of use of DME or AE      OT Treatment/Interventions: Self-care/ADL training;Therapeutic exercise;Therapeutic activities    OT Goals(Current goals can be found in the care plan section) Acute Rehab OT Goals Patient Stated Goal: go home OT Goal Formulation: With patient/family Time For Goal Achievement: 04/20/21 Potential to Achieve Goals: Good ADL Goals Pt Will Perform Lower Body Dressing: with modified independence Pt Will Transfer to Toilet: with modified independence  OT Frequency: Min 2X/week    Co-evaluation              AM-PAC OT "6 Clicks" Daily Activity     Outcome Measure Help from another person eating meals?: None Help from another person taking care of personal grooming?: None Help from another person  toileting, which includes using toliet, bedpan, or urinal?: A Little Help from another person bathing (including washing, rinsing, drying)?: A Little Help from another person to put on and taking off regular upper body clothing?: None Help from another person to put on and taking off regular lower body clothing?: A Little 6 Click Score: 21   End of Session Equipment Utilized During Treatment: Rolling walker (2 wheels) Nurse Communication: Mobility status  Activity Tolerance: Patient tolerated treatment well Patient left: in chair;with call bell/phone within reach;with family/visitor present  OT Visit Diagnosis: Other abnormalities of gait and mobility (R26.89);Pain Pain - Right/Left: Right Pain - part of body: Knee                Time: 1140-1206 OT Time Calculation (min): 26 min Charges:  OT General Charges $OT Visit: 1 Visit OT Evaluation $OT Eval Low Complexity: 1 Low OT Treatments $  Self Care/Home Management : 8-22 mins  Gerrianne Scale, MS, OTR/L ascom [phone] 04/06/21, 1:54 PM

## 2021-04-08 ENCOUNTER — Encounter: Payer: Self-pay | Admitting: Orthopedic Surgery

## 2021-04-08 DIAGNOSIS — Z471 Aftercare following joint replacement surgery: Secondary | ICD-10-CM | POA: Diagnosis not present

## 2021-04-08 DIAGNOSIS — E119 Type 2 diabetes mellitus without complications: Secondary | ICD-10-CM | POA: Diagnosis not present

## 2021-04-08 DIAGNOSIS — Z96651 Presence of right artificial knee joint: Secondary | ICD-10-CM | POA: Diagnosis not present

## 2021-04-08 DIAGNOSIS — Z7901 Long term (current) use of anticoagulants: Secondary | ICD-10-CM | POA: Diagnosis not present

## 2021-04-08 DIAGNOSIS — Z79891 Long term (current) use of opiate analgesic: Secondary | ICD-10-CM | POA: Diagnosis not present

## 2021-04-08 DIAGNOSIS — Z7984 Long term (current) use of oral hypoglycemic drugs: Secondary | ICD-10-CM | POA: Diagnosis not present

## 2021-04-08 DIAGNOSIS — I119 Hypertensive heart disease without heart failure: Secondary | ICD-10-CM | POA: Diagnosis not present

## 2021-04-10 DIAGNOSIS — Z79891 Long term (current) use of opiate analgesic: Secondary | ICD-10-CM | POA: Diagnosis not present

## 2021-04-10 DIAGNOSIS — I119 Hypertensive heart disease without heart failure: Secondary | ICD-10-CM | POA: Diagnosis not present

## 2021-04-10 DIAGNOSIS — Z471 Aftercare following joint replacement surgery: Secondary | ICD-10-CM | POA: Diagnosis not present

## 2021-04-10 DIAGNOSIS — Z96651 Presence of right artificial knee joint: Secondary | ICD-10-CM | POA: Diagnosis not present

## 2021-04-10 DIAGNOSIS — Z7901 Long term (current) use of anticoagulants: Secondary | ICD-10-CM | POA: Diagnosis not present

## 2021-04-10 DIAGNOSIS — E119 Type 2 diabetes mellitus without complications: Secondary | ICD-10-CM | POA: Diagnosis not present

## 2021-04-10 DIAGNOSIS — Z7984 Long term (current) use of oral hypoglycemic drugs: Secondary | ICD-10-CM | POA: Diagnosis not present

## 2021-04-12 DIAGNOSIS — Z96651 Presence of right artificial knee joint: Secondary | ICD-10-CM | POA: Diagnosis not present

## 2021-04-12 DIAGNOSIS — Z7984 Long term (current) use of oral hypoglycemic drugs: Secondary | ICD-10-CM | POA: Diagnosis not present

## 2021-04-12 DIAGNOSIS — E119 Type 2 diabetes mellitus without complications: Secondary | ICD-10-CM | POA: Diagnosis not present

## 2021-04-12 DIAGNOSIS — Z471 Aftercare following joint replacement surgery: Secondary | ICD-10-CM | POA: Diagnosis not present

## 2021-04-12 DIAGNOSIS — I119 Hypertensive heart disease without heart failure: Secondary | ICD-10-CM | POA: Diagnosis not present

## 2021-04-12 DIAGNOSIS — Z7901 Long term (current) use of anticoagulants: Secondary | ICD-10-CM | POA: Diagnosis not present

## 2021-04-12 DIAGNOSIS — Z79891 Long term (current) use of opiate analgesic: Secondary | ICD-10-CM | POA: Diagnosis not present

## 2021-04-14 DIAGNOSIS — Z96651 Presence of right artificial knee joint: Secondary | ICD-10-CM | POA: Diagnosis not present

## 2021-04-14 DIAGNOSIS — Z471 Aftercare following joint replacement surgery: Secondary | ICD-10-CM | POA: Diagnosis not present

## 2021-04-14 DIAGNOSIS — Z7901 Long term (current) use of anticoagulants: Secondary | ICD-10-CM | POA: Diagnosis not present

## 2021-04-14 DIAGNOSIS — E119 Type 2 diabetes mellitus without complications: Secondary | ICD-10-CM | POA: Diagnosis not present

## 2021-04-14 DIAGNOSIS — Z7984 Long term (current) use of oral hypoglycemic drugs: Secondary | ICD-10-CM | POA: Diagnosis not present

## 2021-04-14 DIAGNOSIS — I119 Hypertensive heart disease without heart failure: Secondary | ICD-10-CM | POA: Diagnosis not present

## 2021-04-14 DIAGNOSIS — Z79891 Long term (current) use of opiate analgesic: Secondary | ICD-10-CM | POA: Diagnosis not present

## 2021-04-16 DIAGNOSIS — E119 Type 2 diabetes mellitus without complications: Secondary | ICD-10-CM | POA: Diagnosis not present

## 2021-04-16 DIAGNOSIS — I119 Hypertensive heart disease without heart failure: Secondary | ICD-10-CM | POA: Diagnosis not present

## 2021-04-16 DIAGNOSIS — Z7984 Long term (current) use of oral hypoglycemic drugs: Secondary | ICD-10-CM | POA: Diagnosis not present

## 2021-04-16 DIAGNOSIS — Z7901 Long term (current) use of anticoagulants: Secondary | ICD-10-CM | POA: Diagnosis not present

## 2021-04-16 DIAGNOSIS — Z79891 Long term (current) use of opiate analgesic: Secondary | ICD-10-CM | POA: Diagnosis not present

## 2021-04-16 DIAGNOSIS — Z96651 Presence of right artificial knee joint: Secondary | ICD-10-CM | POA: Diagnosis not present

## 2021-04-16 DIAGNOSIS — Z471 Aftercare following joint replacement surgery: Secondary | ICD-10-CM | POA: Diagnosis not present

## 2021-04-18 DIAGNOSIS — Z7901 Long term (current) use of anticoagulants: Secondary | ICD-10-CM | POA: Diagnosis not present

## 2021-04-18 DIAGNOSIS — Z79891 Long term (current) use of opiate analgesic: Secondary | ICD-10-CM | POA: Diagnosis not present

## 2021-04-18 DIAGNOSIS — Z7984 Long term (current) use of oral hypoglycemic drugs: Secondary | ICD-10-CM | POA: Diagnosis not present

## 2021-04-18 DIAGNOSIS — Z96651 Presence of right artificial knee joint: Secondary | ICD-10-CM | POA: Diagnosis not present

## 2021-04-18 DIAGNOSIS — E119 Type 2 diabetes mellitus without complications: Secondary | ICD-10-CM | POA: Diagnosis not present

## 2021-04-18 DIAGNOSIS — I119 Hypertensive heart disease without heart failure: Secondary | ICD-10-CM | POA: Diagnosis not present

## 2021-04-18 DIAGNOSIS — Z471 Aftercare following joint replacement surgery: Secondary | ICD-10-CM | POA: Diagnosis not present

## 2021-04-19 DIAGNOSIS — Z471 Aftercare following joint replacement surgery: Secondary | ICD-10-CM | POA: Diagnosis not present

## 2021-04-19 DIAGNOSIS — Z96651 Presence of right artificial knee joint: Secondary | ICD-10-CM | POA: Diagnosis not present

## 2021-04-23 DIAGNOSIS — Z96651 Presence of right artificial knee joint: Secondary | ICD-10-CM | POA: Diagnosis not present

## 2021-04-23 DIAGNOSIS — M25661 Stiffness of right knee, not elsewhere classified: Secondary | ICD-10-CM | POA: Diagnosis not present

## 2021-04-25 DIAGNOSIS — Z96651 Presence of right artificial knee joint: Secondary | ICD-10-CM | POA: Diagnosis not present

## 2021-05-01 DIAGNOSIS — Z96651 Presence of right artificial knee joint: Secondary | ICD-10-CM | POA: Diagnosis not present

## 2021-05-02 DIAGNOSIS — L821 Other seborrheic keratosis: Secondary | ICD-10-CM | POA: Diagnosis not present

## 2021-05-02 DIAGNOSIS — Z85828 Personal history of other malignant neoplasm of skin: Secondary | ICD-10-CM | POA: Diagnosis not present

## 2021-05-02 DIAGNOSIS — L739 Follicular disorder, unspecified: Secondary | ICD-10-CM | POA: Diagnosis not present

## 2021-05-02 DIAGNOSIS — L814 Other melanin hyperpigmentation: Secondary | ICD-10-CM | POA: Diagnosis not present

## 2021-05-02 DIAGNOSIS — D229 Melanocytic nevi, unspecified: Secondary | ICD-10-CM | POA: Diagnosis not present

## 2021-05-02 DIAGNOSIS — L570 Actinic keratosis: Secondary | ICD-10-CM | POA: Diagnosis not present

## 2021-05-03 DIAGNOSIS — Z96651 Presence of right artificial knee joint: Secondary | ICD-10-CM | POA: Diagnosis not present

## 2021-05-07 DIAGNOSIS — Z96651 Presence of right artificial knee joint: Secondary | ICD-10-CM | POA: Diagnosis not present

## 2021-05-09 DIAGNOSIS — Z96651 Presence of right artificial knee joint: Secondary | ICD-10-CM | POA: Diagnosis not present

## 2021-05-14 DIAGNOSIS — Z96651 Presence of right artificial knee joint: Secondary | ICD-10-CM | POA: Diagnosis not present

## 2021-05-16 DIAGNOSIS — Z96651 Presence of right artificial knee joint: Secondary | ICD-10-CM | POA: Diagnosis not present

## 2021-05-21 DIAGNOSIS — Z96651 Presence of right artificial knee joint: Secondary | ICD-10-CM | POA: Diagnosis not present

## 2021-05-23 DIAGNOSIS — Z96651 Presence of right artificial knee joint: Secondary | ICD-10-CM | POA: Diagnosis not present

## 2021-06-17 NOTE — Progress Notes (Signed)
Name: Michelle Montgomery   MRN: [id number]    DOB: 1950/10/27   Date:06/18/2021 ? ?     Progress Note ? ?Subjective ? ?Chief Complaint ? ?Follow Up ? ?HPI ? ?DMII with nephropathy: A1C spiked to  9.3 % April 2021 , she was able to changed her diet, was more compliant with metformin and pioglitazone and low dose Glipizide. A1C was 7.9% down to 6.9 % after that  7.9 % and down to 7.1 %  prior to knee surgery. She states glucose fasting has been around 130. She is on statin therapy  for dyslipidemia and also on ACE for microalbuminuria, HTN is under control.  ?   ?HTN: BP is at goal, taking Tenoretic, Norvasc and Benazepril ,  she denies chest pain, palpitation, edema, dizziness or SOB.  ?  ?Hyperlipidemia: taking Atorvastatin, she denies myalgia. Reviewed last labs, LDL has been at goal down to 46  ?  ?Insomnia: she is now off Ambien, taking melatonin and Tylenol Pm- discussed risk of Tylenol pm , and sleeps well most of the time. She tried Trazodone but did not help.  ?  ?Lack of sense of smell and taste: symptoms started a couple of years ago She still can't smell dirty diapers, skunk smell or her dogs bad breath. She saw ENT and he suggested MRI brain but she decided to hold off  . She states mother, maternal grandmother, maternal aunt and cousin have the same problems Unchanged  ?  ?GERD: taking Omeprazole, and no heartburn or regurgitation noticed.  She is doing well, she states when she skips medication for 3 days symptoms returns. Unchanged  ? ?OA: she has a total knee replacement 04/05/2021 and is going for the left knee in July. Doing well, happy with results.  ?  ?Osteopenia: FRAX 9.3% of major fracture and 1.2% of hip fracture, continue vitamin D also calcium. Advised to get another study done since it was done in 2019 , it was ordered but still not done, reminded her again to schedule it  ? ?Obesity: she is trying to keep A1C at goal. She wants to have knee replacement in the Summer.  ? ?Patient Active Problem  List  ? Diagnosis Date Noted  ? 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 Primary osteoarthritis of both knees 03/11/2021  ? Varicose veins of both lower extremities 06/12/2016  ? BPV (benign positional vertigo), right 08/24/2015  ? Smell disturbance 01/16/2015  ? Osteoarthritis 11/03/2014  ? Cramps of lower extremity 11/03/2014  ? Neuralgia neuritis, sciatic nerve 11/03/2014  ? Insomnia, persistent 11/03/2014  ? Dyslipidemia 11/03/2014  ? Type 2 diabetes with nephropathy ([place]) 11/03/2014  ? Essential (primary) hypertension 11/03/2014  ? History of colon polyps 11/03/2014  ? H/O malignant neoplasm of skin 11/03/2014  ? Microalbuminuria 11/03/2014  ? Osteopenia 11/03/2014  ? Plantar fasciitis 11/03/2014  ? Gastro-esophageal reflux disease without esophagitis 11/03/2014  ? Bundle branch block, right 11/03/2014  ? ? ?Past Surgical History:  ?Procedure Laterality Date  ? achiles tendon Left   ? BREAST BIOPSY Left 2015  ? benign  ? FOOT SURGERY Left 08/29/2011  ? Spur Removal , and Achilles Tendon Tendolysis  ? KNEE ARTHROPLASTY Right 04/05/2021  ? Procedure: COMPUTER ASSISTED TOTAL KNEE ARTHROPLASTY;  Surgeon: Dereck Leep, MD;  Location: [facility] ORS;  Service: Orthopedics;  Laterality: Right;  ? VARICOSE VEIN SURGERY Bilateral   ? ? ?Family History  ?Problem Relation Age of Onset  ? Cancer Mother   ?  Colon and breast  ? Anemia Mother   ? Hypertension Mother   ? Breast cancer Mother   ? Cancer Father   ?     Esophageal  ? Heart disease Father   ? Diabetes Brother   ? Cancer Maternal Uncle   ?     Colon  ? Cancer Maternal Grandmother   ?     Colon  ? Healthy Brother   ? Melanoma Brother   ? ? ?Social History  ? ?Tobacco Use  ? Smoking status: Former  ?  Packs/day: 0.25  ?  Years: 2.00  ?  Pack years: 0.50  ?  Types: Cigarettes  ?  Quit date: 02/10/1970  ?  Years since quitting: 51.3  ? Smokeless tobacco: Never  ? Tobacco comments:  ?  smoking cessation materials not required  ?Substance Use Topics  ? Alcohol use: No   ?  Alcohol/week: 0.0 standard drinks  ? ? ? ?Current Outpatient Medications:  ?  Ascorbic Acid (VITAMIN C WITH ROSE HIPS) 1000 MG tablet, Take 1,000 mg by mouth daily., Disp: , Rfl:  ?  benazepril (LOTENSIN) 40 MG tablet, Take 1 tablet (40 mg total) by mouth daily., Disp: 90 tablet, Rfl: 1 ?  blood glucose meter kit and supplies, Dispense based on patient and insurance preference. Use up to four times daily as directed. (FOR ICD-10 E10.9, E11.9)., Disp: 1 each, Rfl: 0 ?  Calcium Carb-Cholecalciferol (CALCIUM 600 + D PO), Take 1 tablet by mouth 2 (two) times daily., Disp: , Rfl:  ?  Calcium Polycarbophil (FIBER-CAPS PO), Take 1 capsule by mouth daily., Disp: , Rfl:  ?  cetirizine (ZYRTEC) 10 MG tablet, Take 10 mg by mouth daily as needed for allergies., Disp: , Rfl:  ?  Cholecalciferol (VITAMIN D) 2000 UNITS CAPS, Take 2,000 Units by mouth daily., Disp: , Rfl:  ?  Coenzyme Q10 300 MG CAPS, Take 300 mg by mouth every evening., Disp: , Rfl:  ?  diclofenac Sodium (VOLTAREN) 1 % GEL, Apply 1 application topically daily as needed (pain)., Disp: , Rfl:  ?  diphenhydramine-acetaminophen (TYLENOL PM) 25-500 MG TABS tablet, Take 1 tablet by mouth at bedtime as needed., Disp: , Rfl:  ?  glucose blood (ONETOUCH VERIO) test strip, 1 each by Other route in the morning and at bedtime. Use as instructed, Disp: 200 each, Rfl: 5 ?  Liniments (DEEP BLUE RELIEF) GEL, Apply 1 application topically daily as needed (pain)., Disp: , Rfl:  ?  Melatonin 5 MG CHEW, Chew by mouth., Disp: , Rfl:  ?  Misc Natural Products (GLUCOSAMINE CHONDROITIN TRIPLE) TABS, Take 1 tablet by mouth daily., Disp: , Rfl:  ?  Multiple Vitamin (MULTIVITAMIN ADULT PO), Take 1 tablet by mouth daily., Disp: , Rfl:  ?  Omega-3 Fatty Acids (FISH OIL CONCENTRATE) 1000 MG CAPS, Take 1,000 mg by mouth daily., Disp: , Rfl:  ?  omeprazole (PRILOSEC) 20 MG capsule, Take 1 capsule (20 mg total) by mouth daily., Disp: 90 capsule, Rfl: 1 ?  OVER THE COUNTER MEDICATION, Take 1  capsule by mouth daily. Cinsulin otc supplement, Disp: , Rfl:  ?  OVER THE COUNTER MEDICATION, Take 1 capsule by mouth daily. Feel good vita fruits and veggies, Disp: , Rfl:  ?  psyllium (REGULOID) 0.52 g capsule, Take 0.52 g by mouth daily., Disp: , Rfl:  ?  UNABLE TO FIND, Take 1 capsule by mouth daily. Doterra Copaiba capsules for immunity and joint pain, Disp: , Rfl:  ?  vitamin E 180 MG (400 UNITS) capsule, Take 400 Units by mouth daily., Disp: , Rfl:  ?  amLODipine (NORVASC) 5 MG tablet, Take 1 tablet (5 mg total) by mouth daily., Disp: 90 tablet, Rfl: 1 ?  atenolol-chlorthalidone (TENORETIC) 50-25 MG tablet, Take 1 tablet by mouth daily., Disp: 90 tablet, Rfl: 1 ?  atorvastatin (LIPITOR) 40 MG tablet, Take 1 tablet (40 mg total) by mouth daily., Disp: 90 tablet, Rfl: 1 ?  glipiZIDE (GLUCOTROL XL) 2.5 MG 24 hr tablet, Take 1 tablet (2.5 mg total) by mouth daily with breakfast., Disp: 90 tablet, Rfl: 1 ?  metFORMIN (GLUCOPHAGE-XR) 750 MG 24 hr tablet, Take 1 tablet (750 mg total) by mouth 2 (two) times daily., Disp: 180 tablet, Rfl: 1 ?  pioglitazone (ACTOS) 30 MG tablet, Take 1 tablet (30 mg total) by mouth daily., Disp: 90 tablet, Rfl: 1 ? ?Allergies  ?Allergen Reactions  ? Codeine Nausea Only  ?  Passed out  ? Celecoxib Hives  ? Penicillins Rash  ?  IgE = 17 (WNL) on 04/02/2021  ? ? ?I personally reviewed active problem list, medication list, allergies, family history, social history, health maintenance with the patient/caregiver today. ? ? ?ROS ? ?Constitutional: Negative for fever or weight change.  ?Respiratory: Negative for cough and shortness of breath.   ?Cardiovascular: Negative for chest pain or palpitations.  ?Gastrointestinal: Negative for abdominal pain, no bowel changes.  ?Musculoskeletal: Negative for gait problem or joint swelling.  ?Skin: Negative for rash.  ?Neurological: Negative for dizziness or headache.  ?No other specific complaints in a complete review of systems (except as listed in HPI  above).  ? ?Objective ? ?Vitals:  ? 06/18/21 0900  ?BP: 130/70  ?Pulse: 63  ?Resp: 16  ?SpO2: 97%  ?Weight: 242 lb (109.8 kg)  ?Height: 5' 10"  (1.778 m)  ? ? ?Body mass index is 34.72 kg/m?. ? ?Physic

## 2021-06-18 ENCOUNTER — Ambulatory Visit (INDEPENDENT_AMBULATORY_CARE_PROVIDER_SITE_OTHER): Payer: Medicare HMO | Admitting: Family Medicine

## 2021-06-18 ENCOUNTER — Encounter: Payer: Self-pay | Admitting: Family Medicine

## 2021-06-18 VITALS — BP 130/70 | HR 63 | Resp 16 | Ht 70.0 in | Wt 242.0 lb

## 2021-06-18 DIAGNOSIS — E785 Hyperlipidemia, unspecified: Secondary | ICD-10-CM | POA: Diagnosis not present

## 2021-06-18 DIAGNOSIS — I10 Essential (primary) hypertension: Secondary | ICD-10-CM

## 2021-06-18 DIAGNOSIS — R439 Unspecified disturbances of smell and taste: Secondary | ICD-10-CM

## 2021-06-18 DIAGNOSIS — M8580 Other specified disorders of bone density and structure, unspecified site: Secondary | ICD-10-CM

## 2021-06-18 DIAGNOSIS — Z780 Asymptomatic menopausal state: Secondary | ICD-10-CM

## 2021-06-18 DIAGNOSIS — E1121 Type 2 diabetes mellitus with diabetic nephropathy: Secondary | ICD-10-CM | POA: Diagnosis not present

## 2021-06-18 DIAGNOSIS — M170 Bilateral primary osteoarthritis of knee: Secondary | ICD-10-CM | POA: Diagnosis not present

## 2021-06-18 DIAGNOSIS — Z1231 Encounter for screening mammogram for malignant neoplasm of breast: Secondary | ICD-10-CM | POA: Diagnosis not present

## 2021-06-18 MED ORDER — AMLODIPINE BESYLATE 5 MG PO TABS: 5.0000 mg | ORAL_TABLET | Freq: Every day | ORAL | 1 refills | Status: DC

## 2021-06-18 MED ORDER — ATENOLOL-CHLORTHALIDONE 50-25 MG PO TABS: 1.0000 | ORAL_TABLET | Freq: Every day | ORAL | 1 refills | Status: DC

## 2021-06-18 MED ORDER — ONETOUCH VERIO VI STRP: 1.0000 | ORAL_STRIP | Freq: Two times a day (BID) | 5 refills | Status: DC

## 2021-06-18 MED ORDER — GLIPIZIDE ER 2.5 MG PO TB24: 2.5000 mg | ORAL_TABLET | Freq: Every day | ORAL | 1 refills | Status: DC

## 2021-06-18 MED ORDER — PIOGLITAZONE HCL 30 MG PO TABS: 30.0000 mg | ORAL_TABLET | Freq: Every day | ORAL | 1 refills | Status: DC

## 2021-06-18 MED ORDER — ATORVASTATIN CALCIUM 40 MG PO TABS: 40.0000 mg | ORAL_TABLET | Freq: Every day | ORAL | 1 refills | Status: DC

## 2021-06-18 MED ORDER — METFORMIN HCL ER 750 MG PO TB24: 750.0000 mg | ORAL_TABLET | Freq: Two times a day (BID) | ORAL | 1 refills | Status: DC

## 2021-06-18 NOTE — Assessment & Plan Note (Signed)
S/p right knee replacement 03/2021 ?Going back to have left knee done in 07/23  ?

## 2021-06-18 NOTE — Assessment & Plan Note (Signed)
Reminded her to have bone density done  ?

## 2021-06-18 NOTE — Assessment & Plan Note (Signed)
Explained importance of repeating bone density  ?

## 2021-06-18 NOTE — Assessment & Plan Note (Signed)
Chronic and stable, not interested in seeing ENT ?

## 2021-06-18 NOTE — Assessment & Plan Note (Signed)
On statin therapy 

## 2021-06-18 NOTE — Assessment & Plan Note (Signed)
On ARB and oral medication, A1C was 7.1 % Feb 2023  ?

## 2021-08-11 NOTE — Discharge Instructions (Signed)
Instructions after Total Knee Replacement   Michelle Montgomery, Jr., M.D.     Dept. of Orthopaedics & Sports Medicine  [facility]  [address]  [place], [place]  [address]  Phone: [phone]   Fax: [phone]    DIET: Drink plenty of non-alcoholic fluids. Resume your normal diet. Include foods high in fiber.  ACTIVITY:  You may use crutches or a walker with weight-bearing as tolerated, unless instructed otherwise. You may be weaned off of the walker or crutches by your Physical Therapist.  Do NOT place pillows under the knee. Anything placed under the knee could limit your ability to straighten the knee.   Continue doing gentle exercises. Exercising will reduce the pain and swelling, increase motion, and prevent muscle weakness.   Please continue to use the TED compression stockings for 6 weeks. You may remove the stockings at night, but should reapply them in the morning. Do not drive or operate any equipment until instructed.  WOUND CARE:  Continue to use the PolarCare or ice packs periodically to reduce pain and swelling. You may bathe or shower after the staples are removed at the first office visit following surgery.  MEDICATIONS: You may resume your regular medications. Please take the pain medication as prescribed on the medication. Do not take pain medication on an empty stomach. You have been given a prescription for a blood thinner (Lovenox or Coumadin). Please take the medication as instructed. (NOTE: After completing a 2 week course of Lovenox, take one Enteric-coated aspirin once a day. This along with elevation will help reduce the possibility of phlebitis in your operated leg.) Do not drive or drink alcoholic beverages when taking pain medications.  CALL THE OFFICE FOR: Temperature above 101 degrees Excessive bleeding or drainage on the dressing. Excessive swelling, coldness, or paleness of the toes. Persistent nausea and vomiting.  FOLLOW-UP:  You  should have an appointment to return to the office in 10-14 days after surgery. Arrangements have been made for continuation of Physical Therapy (either home therapy or outpatient therapy).   [facility] Department Directory         [email or web address]       [email or web address]          Cardiology  Appointments: [place] - [phone] [place] - [phone]  Endocrinology  Appointments: [place] - [phone] [place] - [phone]  Gastroenterology  Appointments: [place] - [phone] [place] - [phone]        General Surgery   Appointments: [place] - [phone]  Internal Medicine/Family Medicine  Appointments: [place] - [phone] [place] - [phone] [place] - [phone]  Metabolic and Weigh Loss Surgery  Appointments: [place] - [phone]        Neurology  Appointments: [place] - [phone] [place] - [phone]  Neurosurgery  Appointments: [place] - [phone]  Obstetrics & Gynecology  Appointments: [place] - [phone] [place] - [phone]        Pediatrics  Appointments: [place] - [phone] [place] - [phone]  Physiatry  Appointments: [place] -[phone]  Physical Therapy  Appointments: [place] - [phone] [place] - [phone]        Podiatry  Appointments: [place] - [phone] [place] - [phone]  Pulmonology  Appointments: [place] - [phone]  Rheumatology  Appointments: [place] - [phone]        [place] Location: [facility]  [address] , [place]  [address]  [place] Location: [facility] [address] [place], [place]  [address]  [place] Location: [facility] [address] [place],   [address]    

## 2021-08-12 ENCOUNTER — Ambulatory Visit
Admission: RE | Admit: 2021-08-12 | Discharge: 2021-08-12 | Disposition: A | Discharge status: Home / Self Care | Payer: Medicare HMO | Source: Ambulatory Visit | Attending: Family Medicine | Admitting: Family Medicine

## 2021-08-12 DIAGNOSIS — M8580 Other specified disorders of bone density and structure, unspecified site: Secondary | ICD-10-CM | POA: Insufficient documentation

## 2021-08-12 DIAGNOSIS — M8589 Other specified disorders of bone density and structure, multiple sites: Secondary | ICD-10-CM | POA: Diagnosis not present

## 2021-08-12 DIAGNOSIS — Z780 Asymptomatic menopausal state: Secondary | ICD-10-CM | POA: Insufficient documentation

## 2021-08-12 DIAGNOSIS — Z1231 Encounter for screening mammogram for malignant neoplasm of breast: Secondary | ICD-10-CM

## 2021-08-19 ENCOUNTER — Other Ambulatory Visit: Payer: Self-pay

## 2021-08-19 ENCOUNTER — Encounter
Admission: RE | Admit: 2021-08-19 | Discharge: 2021-08-19 | Disposition: A | Discharge status: Home / Self Care | Payer: Medicare HMO | Source: Ambulatory Visit | Attending: Orthopedic Surgery | Admitting: Orthopedic Surgery

## 2021-08-19 VITALS — BP 145/80 | HR 59 | Resp 16 | Ht 70.0 in | Wt 237.2 lb

## 2021-08-19 DIAGNOSIS — Z01812 Encounter for preprocedural laboratory examination: Secondary | ICD-10-CM

## 2021-08-19 DIAGNOSIS — Z01818 Encounter for other preprocedural examination: Secondary | ICD-10-CM | POA: Insufficient documentation

## 2021-08-19 DIAGNOSIS — Z96659 Presence of unspecified artificial knee joint: Secondary | ICD-10-CM

## 2021-08-19 DIAGNOSIS — Z01810 Encounter for preprocedural cardiovascular examination: Secondary | ICD-10-CM | POA: Diagnosis not present

## 2021-08-19 DIAGNOSIS — R8290 Unspecified abnormal findings in urine: Secondary | ICD-10-CM

## 2021-08-19 DIAGNOSIS — M170 Bilateral primary osteoarthritis of knee: Secondary | ICD-10-CM | POA: Diagnosis not present

## 2021-08-19 DIAGNOSIS — R809 Proteinuria, unspecified: Secondary | ICD-10-CM | POA: Diagnosis not present

## 2021-08-19 DIAGNOSIS — E1121 Type 2 diabetes mellitus with diabetic nephropathy: Secondary | ICD-10-CM

## 2021-08-19 HISTORY — DX: Family history of other specified conditions: Z84.89

## 2021-08-19 HISTORY — DX: Gastro-esophageal reflux disease without esophagitis: K21.9

## 2021-08-19 HISTORY — DX: Pneumonia, unspecified organism: J18.9

## 2021-08-19 HISTORY — DX: Other specified postprocedural states: Z98.890

## 2021-08-19 LAB — COMPREHENSIVE METABOLIC PANEL WITH GFR
ALT: 21 U/L (ref 0–44)
AST: 19 U/L (ref 15–41)
Albumin: 4.8 g/dL (ref 3.5–5.0)
Alkaline Phosphatase: 40 U/L (ref 38–126)
Anion gap: 11 (ref 5–15)
BUN: 18 mg/dL (ref 8–23)
CO2: 29 mmol/L (ref 22–32)
Calcium: 9.8 mg/dL (ref 8.9–10.3)
Chloride: 99 mmol/L (ref 98–111)
Creatinine, Ser: 0.81 mg/dL (ref 0.44–1.00)
GFR, Estimated: >60 mL/min
Glucose, Bld: 139 mg/dL — ABNORMAL HIGH (ref 70–99)
Potassium: 3.6 mmol/L (ref 3.5–5.1)
Sodium: 139 mmol/L (ref 135–145)
Total Bilirubin: 0.9 mg/dL (ref 0.3–1.2)
Total Protein: 7.8 g/dL (ref 6.5–8.1)

## 2021-08-19 LAB — URINALYSIS, ROUTINE W REFLEX MICROSCOPIC
Bilirubin Urine: NEGATIVE
Glucose, UA: NEGATIVE mg/dL
Hgb urine dipstick: NEGATIVE
Ketones, ur: NEGATIVE mg/dL
Nitrite: NEGATIVE
Protein, ur: NEGATIVE mg/dL
Specific Gravity, Urine: 1.017 (ref 1.005–1.030)
pH: 5 (ref 5.0–8.0)

## 2021-08-19 LAB — SURGICAL PCR SCREEN
MRSA, PCR: NEGATIVE
Staphylococcus aureus: NEGATIVE

## 2021-08-19 LAB — CBC
HCT: 40.1 % (ref 36.0–46.0)
Hemoglobin: 13.1 g/dL (ref 12.0–15.0)
MCH: 29 pg (ref 26.0–34.0)
MCHC: 32.7 g/dL (ref 30.0–36.0)
MCV: 88.7 fL (ref 80.0–100.0)
Platelets: 207 10*3/uL (ref 150–400)
RBC: 4.52 MIL/uL (ref 3.87–5.11)
RDW: 13.8 % (ref 11.5–15.5)
WBC: 4.8 10*3/uL (ref 4.0–10.5)
nRBC: 0 % (ref 0.0–0.2)

## 2021-08-19 LAB — HEMOGLOBIN A1C
Hgb A1c MFr Bld: 7.6 % — ABNORMAL HIGH (ref 4.8–5.6)
Mean Plasma Glucose: 171.42 mg/dL

## 2021-08-19 LAB — TYPE AND SCREEN
ABO/RH(D): A POS
Antibody Screen: NEGATIVE

## 2021-08-19 LAB — C-REACTIVE PROTEIN: CRP: <0.5 mg/dL (ref <1.0)

## 2021-08-19 LAB — SEDIMENTATION RATE: Sed Rate: 9 mm/hr (ref 0–22)

## 2021-08-19 NOTE — Patient Instructions (Addendum)
Your procedure is scheduled on:08/28/21 - Wednesday Report to the Registration Desk on the 1st floor of the [facility]. To find out your arrival time, please call [phone] between 1PM - 3PM on: 08/27/21 - Tuesday If your arrival time is 6:00 am, do not arrive prior to that time as the [place] entrance doors do not open until 6:00 am.  REMEMBER: Instructions that are not followed completely may result in serious medical risk, up to and including death; or upon the discretion of your surgeon and anesthesiologist your surgery may need to be rescheduled.  Do not eat food after midnight the night before surgery.  No gum chewing, lozengers or hard candies.  You may however, drink CLEAR liquids up to 2 hours before you are scheduled to arrive for your surgery. Do not drink anything within 2 hours of your scheduled arrival time. Type 1 and Type 2 diabetics should only drink water.  In addition, your doctor has ordered for you to drink the provided  Gatorade G2 Drinking this carbohydrate drink up to two hours before surgery helps to reduce insulin resistance and improve patient outcomes. Please complete drinking 2 hours prior to scheduled arrival time.  TAKE THESE MEDICATIONS THE MORNING OF SURGERY WITH A SIP OF WATER:  - omeprazole (PRILOSEC) 20 MG, take one the night before and one on the morning of surgery - helps to prevent nausea after surgery. - amLODipine (NORVASC)  - atorvastatin (LIPITOR)    Stop taking metFORMIN (GLUCOPHAGE-XR) beginning 08/26/21, may resume the day after your surgery.  Stop taking beginning 08/21/21, One week prior to surgery: Stop Anti-inflammatories (NSAIDS) such as Advil, Aleve, Ibuprofen, Motrin, Naproxen, Naprosyn and Aspirin based products such as Excedrin, Goodys Powder, BC Powder.  STOP ANY OVER THE COUNTER supplements beginning 08/21/21 until after surgery CALCIUM 600 + D ,  GLUCOSAMINE CHONDROITIN TRIPLE) TABS,  Liniments (DEEP BLUE RELIEF)  GEL  You may take Tylenol if needed for pain up until the day of surgery.  No Alcohol for 24 hours before or after surgery.  No Smoking including e-cigarettes for 24 hours prior to surgery.  No chewable tobacco products for at least 6 hours prior to surgery.  No nicotine patches on the day of surgery.  Do not use any "recreational" drugs for at least a week prior to your surgery.  Please be advised that the combination of cocaine and anesthesia may have negative outcomes, up to and including death. If you test positive for cocaine, your surgery will be cancelled.  On the morning of surgery brush your teeth with toothpaste and water, you may rinse your mouth with mouthwash if you wish. Do not swallow any toothpaste or mouthwash.  Use CHG Soap or wipes as directed on instruction sheet.  Do not wear jewelry, make-up, hairpins, clips or nail polish.  Do not wear lotions, powders, or perfumes.   Do not shave body from the neck down 48 hours prior to surgery just in case you cut yourself which could leave a site for infection.  Also, freshly shaved skin may become irritated if using the CHG soap.  Contact lenses, hearing aids and dentures may not be worn into surgery.  Do not bring valuables to the hospital. [facility] is not responsible for any missing/lost belongings or valuables.   Notify your doctor if there is any change in your medical condition (cold, fever, infection).  Wear comfortable clothing (specific to your surgery type) to the hospital.  After surgery, you can help  prevent lung complications by doing breathing exercises.  Take deep breaths and cough every 1-2 hours. Your doctor may order a device called an Incentive Spirometer to help you take deep breaths. When coughing or sneezing, hold a pillow firmly against your incision with both hands. This is called "splinting." Doing this helps protect your incision. It also decreases belly discomfort.  If you are being  admitted to the hospital overnight, leave your suitcase in the car. After surgery it may be brought to your room.  If you are being discharged the day of surgery, you will not be allowed to drive home. You will need a responsible adult (18 years or older) to drive you home and stay with you that night.   If you are taking public transportation, you will need to have a responsible adult (18 years or older) with you. Please confirm with your physician that it is acceptable to use public transportation.   Please call the [facility]. at [phone] if you have any questions about these instructions.  Surgery Visitation Policy:  Patients undergoing a surgery or procedure may have two family members or support persons with them as long as the person is not COVID-19 positive or experiencing its symptoms.   Inpatient Visitation:    Visiting hours are 7 a.m. to 8 p.m. Up to four visitors are allowed at one time in a patient room, including children. The visitors may rotate out with other people during the day. One designated support person (adult) may remain overnight.

## 2021-08-20 DIAGNOSIS — M1712 Unilateral primary osteoarthritis, left knee: Secondary | ICD-10-CM | POA: Diagnosis not present

## 2021-08-20 LAB — URINE CULTURE: Culture: 20000 — AB

## 2021-08-25 NOTE — H&P (Signed)
ORTHOPAEDIC HISTORY & PHYSICAL [name], [place] - 08/20/2021 9:30 AM EDT Formatting of this note is different from the original. [place] Chief Complaint:   Chief Complaint  Patient presents with  Knee Pain  H & P LEFT KNEE   History of Present Illness:   Michelle Montgomery is a 71 y.o. female that presents to clinic today for her preoperative history and evaluation. Patient presents unaccompanied. The patient is scheduled to undergo a left total knee arthroplasty on 08/28/21 by Dr. Marry Guan. Her pain began several years ago. The pain is located primarily along the medial aspect of the knee. She describes her pain as worse with weightbearing. She reports associated swelling with some giving way of the knee. She denies associated numbness or tingling, denies locking.   The patient's symptoms have progressed to the point that they decrease her quality of life. The patient has previously undergone conservative treatment including NSAIDS and injections to the knee without adequate control of her symptoms.  Denies history of lumbar surgery, DVT, or significant cardiac history.  Patient states she was unable to tolerate Celebrex after her R TKA in February.  Reports penicillin allergy but IgE was within normal limits (17) on 03/27/2021.  Past Medical, Surgical, Family, Social History, Allergies, Medications:   Past Medical History:  Past Medical History:  Diagnosis Date  Diabetes type 2, controlled (CMS-HCC)  Hypertension   Past Surgical History:  Past Surgical History:  Procedure Laterality Date  COLONOSCOPY 01/16/2004  Adenomatous Polyps, FHCC (Mother), FH Colon Polyps (Brother)  EGD 01/16/2004  Barrett's Esophagus, FH Esophageal Cancer (Father)  COLONOSCOPY 10/10/2010  PH Adenomatous Polyps, FHCC (Mother), FH Colon Polyps (Brother): [facility] 09/2015; Recall Ltr mailed 08/15/2015 (dw)  EGD 10/10/2010  No Barrett's Seen, FH Esophageal  Cancer (Father): [facility] 09/2015; Recall Ltr mailed 08/15/2015 (dw)  EGD 10/27/2017  PH Barrett's Esophagus: No repeat per RTE  COLONOSCOPY 10/27/2017  Adenomatous Polyp; [place] (Mother) FHCP (Brother) [facility] 10/2022  Right total knee arthroplasty using computer-assisted navigation 04/05/2021  Dr Marry Guan  [facility] TENDON REPAIR   Current Medications:  Current Outpatient Medications  Medication Sig Dispense Refill  acetaminophen (TYLENOL) 500 MG tablet Take 500 mg by mouth every 6 (six) hours as needed  amLODIPine (NORVASC) 5 MG tablet Take 5 mg by mouth once daily  aspirin 81 MG EC tablet Take 81 mg by mouth once daily  aspirin-calcium carbonate 81 mg-300 mg calcium(777 mg) Tab Take 1 tablet by mouth once daily  atenoloL-chlorthalidone (TENORETIC) 50-25 mg tablet Take 1 tablet by mouth once daily  atorvastatin (LIPITOR) 40 MG tablet Take 40 mg by mouth once daily  benazepril (LOTENSIN) 40 MG tablet Take 40 mg by mouth once daily  calcium carbonate (CALCIUM 600 ORAL) Take 1 tablet by mouth 2 (two) times daily  calcium polycarbophiL (FIBERCON) 625 mg tablet Take 1 capsule by mouth once daily  cartilage/collagen/bor/hyalur (JOINT HEALTH ORAL) Take 1 tablet by mouth daily with breakfast  diphenhydrAMINE-acetaminophen (TYLENOL PM) 25-500 mg per tablet Take 1 tablet by mouth at bedtime as needed  glipiZIDE (GLUCOTROL) 2.5 MG XL tablet Take 2.5 mg by mouth once daily  melatonin 10 mg Tab Take 10 mg by mouth at bedtime  melatonin 5 mg Chew Take 1 tablet by mouth at bedtime as needed  metFORMIN (GLUCOPHAGE-XR) 750 MG XR tablet Take 1 tablet by mouth 2 (two) times daily with meals  miscellaneous medical supply Misc Dispense based on patient and insurance preference. Use up to  four times daily as directed. (FOR ICD-10 E10.9, E11.9).  msm-aloe vera-herbal66-emu oil (DEEP BLUE RELIEF) Gel Apply topically 2 (two) times daily as needed  omeprazole (PRILOSEC) 20 MG DR capsule Take 20 mg by mouth once daily  ONETOUCH  DELICA PLUS LANCET 1 each once daily  ONETOUCH VERIO REFLECT METER Misc 1 each as directed  pioglitazone (ACTOS) 30 MG tablet Take 1 tablet by mouth once daily  psyllium (METAMUCIL) 0.52 gram capsule Take 0.52 g by mouth once daily   No current facility-administered medications for this visit.   Allergies:  Allergies  Allergen Reactions  Celecoxib Rash  Codeine Nausea and Vomiting  Passed out  Penicillins Rash  IgE WNL (17) 03/27/21   Social History:  Social History   Socioeconomic History  Marital status: Married  Spouse name: Iona Beard  Number of children: 2  Years of education: 12  Highest education level: High school graduate  Occupational History  Occupation: Retired- Software engineer- now mission work  Tobacco Use  Smoking status: Former  Years: 3.00  Types: Cigarettes  Quit date: 1972  Years since quitting: 51.5  Smokeless tobacco: Never  Vaping Use  Vaping Use: Never used  Substance and Sexual Activity  Alcohol use: Never  Drug use: Never  Sexual activity: Defer  Partners: Male   Family History:  Family History  Problem Relation Age of Onset  Breast cancer Mother  Colon cancer Mother  Diabetes type II Brother  Diabetes type II Brother   Review of Systems:   A 10+ ROS was performed, reviewed, and the pertinent orthopaedic findings are documented in the HPI.   Physical Examination:   BP 124/74 (BP Location: Left upper arm, Patient Position: Sitting, BP Cuff Size: Large Adult)  Ht 177.8 cm ('5\' 10"'$ )  Wt (!) 108 kg (238 lb)  BMI 34.15 kg/m   Patient is a well-developed, well-nourished female in no acute distress. Patient has normal mood and affect. Patient is alert and oriented to person, place, and time.   HEENT: Atraumatic, normocephalic. Pupils equal and reactive to light. Extraocular motion intact. Noninjected sclera.  Cardiovascular: Regular rate and rhythm, with no murmurs, rubs, or gallops. Distal pulses palpable. No carotid  bruits  Respiratory: Lungs clear to auscultation bilaterally.   Left Knee: Soft tissue swelling: mild Effusion: none Erythema: none Crepitance: mild Tenderness: medial Alignment: relative varus Mediolateral laxity: medial pseudolaxity Posterior sag: negative Patellar tracking: Good tracking without evidence of subluxation or tilt Atrophy: No significant atrophy.  Quadriceps tone was fair to good. Range of motion: 0/6/110 degrees  Sensation intact over the saphenous, lateral sural cutaneous, superficial fibular, and deep fibular nerve distributions.  Tests Performed/Reviewed:  X-rays  3 views of the left knee were obtained. Images reveal severe loss of medial compartment joint space with osteophyte formation and bone-on-bone contact. No fractures or dislocations. No other osseous abnormality noted.  I personally ordered and interpreted today's x-rays.  Impression:   ICD-10-CM  1. Primary osteoarthritis of left knee M17.12   Plan:   The patient has end-stage degenerative changes of the left knee. It was explained to the patient that the condition is progressive in nature. Having failed conservative treatment, the patient has elected to proceed with a total joint arthroplasty. The patient will undergo a total joint arthroplasty with Dr. Marry Guan. The risks of surgery, including blood clot and infection, were discussed with the patient. Measures to reduce these risks, including the use of anticoagulation, perioperative antibiotics, and early ambulation were discussed. The  importance of postoperative physical therapy was discussed with the patient. The patient elects to proceed with surgery. The patient is instructed to stop all blood thinners prior to surgery. The patient is instructed to call the hospital the day before surgery to learn of the proper arrival time.   Contact our office with any questions or concerns. Follow up as indicated, or sooner should any new problems arise, if  conditions worsen, or if they are otherwise concerned.   [name], [facility] [place], [place] [address] Phone: [phone]  This note was generated in part with voice recognition software and I apologize for any typographical errors that were not detected and corrected.  Electronically signed by [name], PA at 08/20/2021 12:57 PM EDT

## 2021-08-28 ENCOUNTER — Ambulatory Visit: Payer: Medicare HMO | Admitting: Anesthesiology

## 2021-08-28 ENCOUNTER — Observation Stay
Admission: RE | Admit: 2021-08-28 | Discharge: 2021-08-29 | Disposition: A | Discharge status: Home Health | Payer: Medicare HMO | Attending: Orthopedic Surgery | Admitting: Orthopedic Surgery

## 2021-08-28 ENCOUNTER — Other Ambulatory Visit: Payer: Self-pay

## 2021-08-28 ENCOUNTER — Encounter
Admission: RE | Disposition: A | Discharge status: Home Health | Payer: Self-pay | Source: Home / Self Care | Attending: Orthopedic Surgery

## 2021-08-28 ENCOUNTER — Encounter: Payer: Self-pay | Admitting: Orthopedic Surgery

## 2021-08-28 ENCOUNTER — Ambulatory Visit: Payer: Medicare HMO | Admitting: Urgent Care

## 2021-08-28 ENCOUNTER — Observation Stay: Payer: Medicare HMO

## 2021-08-28 ENCOUNTER — Ambulatory Visit: Payer: Self-pay

## 2021-08-28 DIAGNOSIS — Z01812 Encounter for preprocedural laboratory examination: Secondary | ICD-10-CM

## 2021-08-28 DIAGNOSIS — K219 Gastro-esophageal reflux disease without esophagitis: Secondary | ICD-10-CM | POA: Diagnosis not present

## 2021-08-28 DIAGNOSIS — M25562 Pain in left knee: Secondary | ICD-10-CM

## 2021-08-28 DIAGNOSIS — E1121 Type 2 diabetes mellitus with diabetic nephropathy: Secondary | ICD-10-CM

## 2021-08-28 DIAGNOSIS — Z471 Aftercare following joint replacement surgery: Secondary | ICD-10-CM | POA: Diagnosis not present

## 2021-08-28 DIAGNOSIS — R809 Proteinuria, unspecified: Secondary | ICD-10-CM

## 2021-08-28 DIAGNOSIS — Z7982 Long term (current) use of aspirin: Secondary | ICD-10-CM | POA: Insufficient documentation

## 2021-08-28 DIAGNOSIS — Z79899 Other long term (current) drug therapy: Secondary | ICD-10-CM | POA: Insufficient documentation

## 2021-08-28 DIAGNOSIS — Z96651 Presence of right artificial knee joint: Secondary | ICD-10-CM | POA: Insufficient documentation

## 2021-08-28 DIAGNOSIS — Z96652 Presence of left artificial knee joint: Secondary | ICD-10-CM | POA: Diagnosis not present

## 2021-08-28 DIAGNOSIS — M170 Bilateral primary osteoarthritis of knee: Secondary | ICD-10-CM

## 2021-08-28 DIAGNOSIS — Z96659 Presence of unspecified artificial knee joint: Secondary | ICD-10-CM

## 2021-08-28 DIAGNOSIS — M1712 Unilateral primary osteoarthritis, left knee: Secondary | ICD-10-CM | POA: Diagnosis not present

## 2021-08-28 DIAGNOSIS — I10 Essential (primary) hypertension: Secondary | ICD-10-CM | POA: Diagnosis not present

## 2021-08-28 DIAGNOSIS — Z87891 Personal history of nicotine dependence: Secondary | ICD-10-CM | POA: Insufficient documentation

## 2021-08-28 DIAGNOSIS — Z85828 Personal history of other malignant neoplasm of skin: Secondary | ICD-10-CM | POA: Insufficient documentation

## 2021-08-28 DIAGNOSIS — Z96642 Presence of left artificial hip joint: Secondary | ICD-10-CM | POA: Diagnosis not present

## 2021-08-28 DIAGNOSIS — E119 Type 2 diabetes mellitus without complications: Secondary | ICD-10-CM | POA: Diagnosis not present

## 2021-08-28 DIAGNOSIS — Z7984 Long term (current) use of oral hypoglycemic drugs: Secondary | ICD-10-CM | POA: Diagnosis not present

## 2021-08-28 DIAGNOSIS — M7989 Other specified soft tissue disorders: Secondary | ICD-10-CM | POA: Diagnosis not present

## 2021-08-28 DIAGNOSIS — M1711 Unilateral primary osteoarthritis, right knee: Secondary | ICD-10-CM | POA: Diagnosis not present

## 2021-08-28 HISTORY — PX: KNEE ARTHROPLASTY: SHX992

## 2021-08-28 LAB — POCT I-STAT, CHEM 8
BUN: 20 mg/dL (ref 8–23)
Calcium, Ion: 1.13 mmol/L — ABNORMAL LOW (ref 1.15–1.40)
Chloride: 98 mmol/L (ref 98–111)
Creatinine, Ser: 0.7 mg/dL (ref 0.44–1.00)
Glucose, Bld: 170 mg/dL — ABNORMAL HIGH (ref 70–99)
HCT: 35 % — ABNORMAL LOW (ref 36.0–46.0)
Hemoglobin: 11.9 g/dL — ABNORMAL LOW (ref 12.0–15.0)
Potassium: 3.3 mmol/L — ABNORMAL LOW (ref 3.5–5.1)
Sodium: 139 mmol/L (ref 135–145)
TCO2: 24 mmol/L (ref 22–32)

## 2021-08-28 LAB — GLUCOSE, CAPILLARY
Glucose-Capillary: 173 mg/dL — ABNORMAL HIGH (ref 70–99)
Glucose-Capillary: 220 mg/dL — ABNORMAL HIGH (ref 70–99)
Glucose-Capillary: 199 mg/dL — ABNORMAL HIGH (ref 70–99)
Glucose-Capillary: 246 mg/dL — ABNORMAL HIGH (ref 70–99)
Glucose-Capillary: 221 mg/dL — ABNORMAL HIGH (ref 70–99)

## 2021-08-28 SURGERY — ARTHROPLASTY, KNEE, TOTAL, USING IMAGELESS COMPUTER-ASSISTED NAVIGATION
Anesthesia: Spinal | Site: Knee | Laterality: Left

## 2021-08-28 MED ORDER — ONDANSETRON HCL 4 MG/2ML IJ SOLN: 4.0000 mg | Freq: Four times a day (QID) | INTRAMUSCULAR | Status: DC | PRN

## 2021-08-28 MED ORDER — BENAZEPRIL HCL 20 MG PO TABS
40.0000 mg | ORAL_TABLET | Freq: Every day | ORAL | Status: DC
Start: 2021-08-28 — End: 2021-08-29
  Administered 2021-08-28 – 2021-08-29 (×2): 40 mg via ORAL
  Filled 2021-08-28 (×2): qty 2

## 2021-08-28 MED ORDER — BUPIVACAINE HCL (PF) 0.5 % IJ SOLN
INTRAMUSCULAR | Status: AC
  Filled 2021-08-28: qty 10

## 2021-08-28 MED ORDER — DEXAMETHASONE SODIUM PHOSPHATE 10 MG/ML IJ SOLN: 8.0000 mg | Freq: Once | INTRAMUSCULAR | Status: AC

## 2021-08-28 MED ORDER — DIPHENHYDRAMINE HCL 12.5 MG/5ML PO ELIX: 12.5000 mg | ORAL_SOLUTION | ORAL | Status: DC | PRN

## 2021-08-28 MED ORDER — PROPOFOL 500 MG/50ML IV EMUL
INTRAVENOUS | Status: DC | PRN
  Administered 2021-08-28: 100 ug/kg/min via INTRAVENOUS

## 2021-08-28 MED ORDER — FENTANYL CITRATE (PF) 100 MCG/2ML IJ SOLN
INTRAMUSCULAR | Status: AC
  Filled 2021-08-28: qty 2

## 2021-08-28 MED ORDER — CEFAZOLIN SODIUM-DEXTROSE 2-4 GM/100ML-% IV SOLN
INTRAVENOUS | Status: AC
  Filled 2021-08-28: qty 100

## 2021-08-28 MED ORDER — SODIUM CHLORIDE 0.9 % IV SOLN: INTRAVENOUS | Status: DC

## 2021-08-28 MED ORDER — DEXAMETHASONE SODIUM PHOSPHATE 10 MG/ML IJ SOLN
INTRAMUSCULAR | Status: AC
  Administered 2021-08-28: 8 mg via INTRAVENOUS
  Filled 2021-08-28: qty 1

## 2021-08-28 MED ORDER — FERROUS SULFATE 325 (65 FE) MG PO TABS
325.0000 mg | ORAL_TABLET | Freq: Two times a day (BID) | ORAL | Status: DC
  Administered 2021-08-28 – 2021-08-29 (×2): 325 mg via ORAL
  Filled 2021-08-28 (×2): qty 1

## 2021-08-28 MED ORDER — CHLORHEXIDINE GLUCONATE 0.12 % MT SOLN: 15.0000 mL | Freq: Once | OROMUCOSAL | Status: AC

## 2021-08-28 MED ORDER — TRANEXAMIC ACID-NACL 1000-0.7 MG/100ML-% IV SOLN
INTRAVENOUS | Status: AC
  Administered 2021-08-28: 1000 mg via INTRAVENOUS
  Filled 2021-08-28: qty 100

## 2021-08-28 MED ORDER — MAGNESIUM HYDROXIDE 400 MG/5ML PO SUSP
30.0000 mL | Freq: Every day | ORAL | Status: DC
  Administered 2021-08-28 – 2021-08-29 (×2): 30 mL via ORAL
  Filled 2021-08-28 (×2): qty 30

## 2021-08-28 MED ORDER — PANTOPRAZOLE SODIUM 40 MG PO TBEC
40.0000 mg | DELAYED_RELEASE_TABLET | Freq: Two times a day (BID) | ORAL | Status: DC
  Administered 2021-08-28 – 2021-08-29 (×3): 40 mg via ORAL
  Filled 2021-08-28 (×3): qty 1

## 2021-08-28 MED ORDER — PROPOFOL 10 MG/ML IV BOLUS
INTRAVENOUS | Status: DC | PRN
  Administered 2021-08-28: 40 mg via INTRAVENOUS
  Administered 2021-08-28: 50 mg via INTRAVENOUS
  Administered 2021-08-28: 40 mg via INTRAVENOUS

## 2021-08-28 MED ORDER — MIDAZOLAM HCL 2 MG/2ML IJ SOLN
INTRAMUSCULAR | Status: AC
  Filled 2021-08-28: qty 2

## 2021-08-28 MED ORDER — DEXMEDETOMIDINE HCL IN NACL 80 MCG/20ML IV SOLN
INTRAVENOUS | Status: AC
  Filled 2021-08-28: qty 20

## 2021-08-28 MED ORDER — ACETAMINOPHEN 10 MG/ML IV SOLN
INTRAVENOUS | Status: AC
  Filled 2021-08-28: qty 100

## 2021-08-28 MED ORDER — ORAL CARE MOUTH RINSE: 15.0000 mL | Freq: Once | OROMUCOSAL | Status: AC

## 2021-08-28 MED ORDER — FENTANYL CITRATE (PF) 100 MCG/2ML IJ SOLN: 25.0000 ug | INTRAMUSCULAR | Status: DC | PRN

## 2021-08-28 MED ORDER — OXYCODONE HCL 5 MG/5ML PO SOLN: 5.0000 mg | Freq: Once | ORAL | Status: AC | PRN

## 2021-08-28 MED ORDER — INSULIN ASPART 100 UNIT/ML IJ SOLN
0.0000 [IU] | Freq: Three times a day (TID) | INTRAMUSCULAR | Status: DC
  Administered 2021-08-28: 3 [IU] via SUBCUTANEOUS
  Administered 2021-08-28: 5 [IU] via SUBCUTANEOUS
  Administered 2021-08-29 (×2): 3 [IU] via SUBCUTANEOUS
  Filled 2021-08-28 (×4): qty 1

## 2021-08-28 MED ORDER — ONDANSETRON HCL 4 MG/2ML IJ SOLN
INTRAMUSCULAR | Status: DC | PRN
  Administered 2021-08-28: 4 mg via INTRAVENOUS

## 2021-08-28 MED ORDER — TRANEXAMIC ACID-NACL 1000-0.7 MG/100ML-% IV SOLN
INTRAVENOUS | Status: AC
  Filled 2021-08-28: qty 100

## 2021-08-28 MED ORDER — INSULIN ASPART 100 UNIT/ML IJ SOLN
0.0000 [IU] | Freq: Every day | INTRAMUSCULAR | Status: DC
  Administered 2021-08-28: 2 [IU] via SUBCUTANEOUS
  Filled 2021-08-28: qty 1

## 2021-08-28 MED ORDER — METFORMIN HCL ER 750 MG PO TB24
750.0000 mg | ORAL_TABLET | Freq: Two times a day (BID) | ORAL | Status: DC
  Administered 2021-08-28 – 2021-08-29 (×2): 750 mg via ORAL
  Filled 2021-08-28 (×3): qty 1

## 2021-08-28 MED ORDER — GABAPENTIN 300 MG PO CAPS: 300.0000 mg | ORAL_CAPSULE | Freq: Once | ORAL | Status: AC

## 2021-08-28 MED ORDER — OXYCODONE HCL 5 MG PO TABS
5.0000 mg | ORAL_TABLET | Freq: Once | ORAL | Status: AC | PRN
  Administered 2021-08-28: 5 mg via ORAL

## 2021-08-28 MED ORDER — ATORVASTATIN CALCIUM 20 MG PO TABS: 40.0000 mg | ORAL_TABLET | Freq: Every evening | ORAL | Status: DC

## 2021-08-28 MED ORDER — PHENOL 1.4 % MT LIQD: 1.0000 | OROMUCOSAL | Status: DC | PRN

## 2021-08-28 MED ORDER — KETAMINE HCL 50 MG/5ML IJ SOSY
PREFILLED_SYRINGE | INTRAMUSCULAR | Status: AC
  Filled 2021-08-28: qty 5

## 2021-08-28 MED ORDER — PHENYLEPHRINE HCL-NACL 20-0.9 MG/250ML-% IV SOLN
INTRAVENOUS | Status: AC
  Filled 2021-08-28: qty 250

## 2021-08-28 MED ORDER — GLYCOPYRROLATE 0.2 MG/ML IJ SOLN
INTRAMUSCULAR | Status: DC | PRN
  Administered 2021-08-28 (×2): .1 mg via INTRAVENOUS

## 2021-08-28 MED ORDER — GABAPENTIN 300 MG PO CAPS
ORAL_CAPSULE | ORAL | Status: AC
  Administered 2021-08-28: 300 mg via ORAL
  Filled 2021-08-28: qty 1

## 2021-08-28 MED ORDER — PSYLLIUM 95 % PO PACK
1.0000 | PACK | Freq: Every day | ORAL | Status: DC
  Administered 2021-08-28 – 2021-08-29 (×2): 1 via ORAL
  Filled 2021-08-28 (×2): qty 1

## 2021-08-28 MED ORDER — OXYCODONE HCL 5 MG PO TABS: 5.0000 mg | ORAL_TABLET | ORAL | Status: DC | PRN

## 2021-08-28 MED ORDER — CHLORTHALIDONE 25 MG PO TABS
25.0000 mg | ORAL_TABLET | Freq: Every day | ORAL | Status: DC
  Administered 2021-08-28 – 2021-08-29 (×2): 25 mg via ORAL
  Filled 2021-08-28 (×2): qty 1

## 2021-08-28 MED ORDER — KETAMINE HCL 10 MG/ML IJ SOLN
INTRAMUSCULAR | Status: DC | PRN
  Administered 2021-08-28: 20 mg via INTRAVENOUS

## 2021-08-28 MED ORDER — AMLODIPINE BESYLATE 5 MG PO TABS
5.0000 mg | ORAL_TABLET | Freq: Every day | ORAL | Status: DC
  Administered 2021-08-29: 5 mg via ORAL
  Filled 2021-08-28: qty 1

## 2021-08-28 MED ORDER — PROPOFOL 1000 MG/100ML IV EMUL
INTRAVENOUS | Status: AC
  Filled 2021-08-28: qty 100

## 2021-08-28 MED ORDER — SENNOSIDES-DOCUSATE SODIUM 8.6-50 MG PO TABS
1.0000 | ORAL_TABLET | Freq: Two times a day (BID) | ORAL | Status: DC
  Administered 2021-08-28 – 2021-08-29 (×3): 1 via ORAL
  Filled 2021-08-28 (×3): qty 1

## 2021-08-28 MED ORDER — FENTANYL CITRATE (PF) 100 MCG/2ML IJ SOLN
INTRAMUSCULAR | Status: DC | PRN
  Administered 2021-08-28: 25 ug via INTRAVENOUS
  Administered 2021-08-28 (×2): 12.5 ug via INTRAVENOUS
  Administered 2021-08-28: 25 ug via INTRAVENOUS

## 2021-08-28 MED ORDER — ATENOLOL 25 MG PO TABS
50.0000 mg | ORAL_TABLET | Freq: Every day | ORAL | Status: DC
Start: 2021-08-28 — End: 2021-08-29
  Administered 2021-08-28 – 2021-08-29 (×2): 50 mg via ORAL
  Filled 2021-08-28 (×2): qty 2

## 2021-08-28 MED ORDER — PIOGLITAZONE HCL 30 MG PO TABS
30.0000 mg | ORAL_TABLET | Freq: Every day | ORAL | Status: DC
  Administered 2021-08-28 – 2021-08-29 (×2): 30 mg via ORAL
  Filled 2021-08-28 (×2): qty 1

## 2021-08-28 MED ORDER — CHLORHEXIDINE GLUCONATE 0.12 % MT SOLN
OROMUCOSAL | Status: AC
  Administered 2021-08-28: 15 mL via OROMUCOSAL
  Filled 2021-08-28: qty 15

## 2021-08-28 MED ORDER — TRAMADOL HCL 50 MG PO TABS
50.0000 mg | ORAL_TABLET | ORAL | Status: DC | PRN
  Administered 2021-08-29: 50 mg via ORAL
  Filled 2021-08-28: qty 1

## 2021-08-28 MED ORDER — ATENOLOL-CHLORTHALIDONE 50-25 MG PO TABS: 1.0000 | ORAL_TABLET | Freq: Every day | ORAL | Status: DC

## 2021-08-28 MED ORDER — TRANEXAMIC ACID-NACL 1000-0.7 MG/100ML-% IV SOLN: 1000.0000 mg | Freq: Once | INTRAVENOUS | Status: AC

## 2021-08-28 MED ORDER — CHLORHEXIDINE GLUCONATE 4 % EX LIQD
60.0000 mL | Freq: Once | CUTANEOUS | Status: AC
  Administered 2021-08-28: 4 via TOPICAL

## 2021-08-28 MED ORDER — MIDAZOLAM HCL 5 MG/5ML IJ SOLN
INTRAMUSCULAR | Status: DC | PRN
  Administered 2021-08-28: 2 mg via INTRAVENOUS

## 2021-08-28 MED ORDER — BUPIVACAINE LIPOSOME 1.3 % IJ SUSP
INTRAMUSCULAR | Status: AC
  Filled 2021-08-28: qty 40

## 2021-08-28 MED ORDER — CEFAZOLIN SODIUM-DEXTROSE 2-4 GM/100ML-% IV SOLN
2.0000 g | Freq: Four times a day (QID) | INTRAVENOUS | Status: AC
  Administered 2021-08-28 (×2): 2 g via INTRAVENOUS
  Filled 2021-08-28 (×2): qty 100

## 2021-08-28 MED ORDER — BISACODYL 10 MG RE SUPP: 10.0000 mg | Freq: Every day | RECTAL | Status: DC | PRN

## 2021-08-28 MED ORDER — OXYCODONE HCL 5 MG PO TABS
ORAL_TABLET | ORAL | Status: AC
  Filled 2021-08-28: qty 1

## 2021-08-28 MED ORDER — SODIUM CHLORIDE FLUSH 0.9 % IV SOLN
INTRAVENOUS | Status: AC
  Filled 2021-08-28: qty 80

## 2021-08-28 MED ORDER — 0.9 % SODIUM CHLORIDE (POUR BTL) OPTIME
TOPICAL | Status: DC | PRN
  Administered 2021-08-28: 500 mL

## 2021-08-28 MED ORDER — OXYCODONE HCL 5 MG PO TABS: 10.0000 mg | ORAL_TABLET | ORAL | Status: DC | PRN

## 2021-08-28 MED ORDER — SODIUM CHLORIDE (PF) 0.9 % IJ SOLN
INTRAMUSCULAR | Status: DC | PRN
  Administered 2021-08-28: 120 mL via INTRAMUSCULAR

## 2021-08-28 MED ORDER — MELOXICAM 7.5 MG PO TABS
ORAL_TABLET | ORAL | Status: AC
  Administered 2021-08-28: 15 mg via ORAL
  Filled 2021-08-28: qty 2

## 2021-08-28 MED ORDER — SODIUM CHLORIDE 0.9 % IR SOLN
Status: DC | PRN
  Administered 2021-08-28: 3000 mL

## 2021-08-28 MED ORDER — BUPIVACAINE HCL (PF) 0.25 % IJ SOLN
INTRAMUSCULAR | Status: AC
  Filled 2021-08-28: qty 120

## 2021-08-28 MED ORDER — ACETAMINOPHEN 10 MG/ML IV SOLN
INTRAVENOUS | Status: DC | PRN
  Administered 2021-08-28: 1000 mg via INTRAVENOUS

## 2021-08-28 MED ORDER — BUPIVACAINE HCL (PF) 0.5 % IJ SOLN
INTRAMUSCULAR | Status: DC | PRN
  Administered 2021-08-28: 3 mL

## 2021-08-28 MED ORDER — ENOXAPARIN SODIUM 30 MG/0.3ML IJ SOSY
30.0000 mg | PREFILLED_SYRINGE | Freq: Two times a day (BID) | INTRAMUSCULAR | Status: DC
Start: 2021-08-29 — End: 2021-08-29
  Administered 2021-08-29: 30 mg via SUBCUTANEOUS
  Filled 2021-08-28: qty 0.3

## 2021-08-28 MED ORDER — CEFAZOLIN SODIUM-DEXTROSE 2-4 GM/100ML-% IV SOLN
2.0000 g | INTRAVENOUS | Status: AC
  Administered 2021-08-28: 2 g via INTRAVENOUS

## 2021-08-28 MED ORDER — ACETAMINOPHEN 10 MG/ML IV SOLN
1000.0000 mg | Freq: Four times a day (QID) | INTRAVENOUS | Status: DC
  Administered 2021-08-28 – 2021-08-29 (×3): 1000 mg via INTRAVENOUS
  Filled 2021-08-28 (×3): qty 100

## 2021-08-28 MED ORDER — OYSTER SHELL CALCIUM/D3 500-5 MG-MCG PO TABS
1.0000 | ORAL_TABLET | Freq: Every day | ORAL | Status: DC
  Administered 2021-08-28 – 2021-08-29 (×2): 1 via ORAL
  Filled 2021-08-28 (×2): qty 1

## 2021-08-28 MED ORDER — BUPIVACAINE HCL (PF) 0.5 % IJ SOLN: INTRAMUSCULAR | Status: DC | PRN

## 2021-08-28 MED ORDER — HYDROMORPHONE HCL 1 MG/ML IJ SOLN: 0.5000 mg | INTRAMUSCULAR | Status: DC | PRN

## 2021-08-28 MED ORDER — METOCLOPRAMIDE HCL 5 MG PO TABS
10.0000 mg | ORAL_TABLET | Freq: Three times a day (TID) | ORAL | Status: DC
  Administered 2021-08-28 – 2021-08-29 (×3): 10 mg via ORAL
  Filled 2021-08-28 (×4): qty 2

## 2021-08-28 MED ORDER — MENTHOL 3 MG MT LOZG: 1.0000 | LOZENGE | OROMUCOSAL | Status: DC | PRN

## 2021-08-28 MED ORDER — TRANEXAMIC ACID-NACL 1000-0.7 MG/100ML-% IV SOLN
1000.0000 mg | INTRAVENOUS | Status: AC
  Administered 2021-08-28: 1000 mg via INTRAVENOUS

## 2021-08-28 MED ORDER — ACETAMINOPHEN 325 MG PO TABS: 325.0000 mg | ORAL_TABLET | Freq: Four times a day (QID) | ORAL | Status: DC | PRN

## 2021-08-28 MED ORDER — SURGIPHOR WOUND IRRIGATION SYSTEM - OPTIME
TOPICAL | Status: DC | PRN
  Administered 2021-08-28: 1

## 2021-08-28 MED ORDER — GLIPIZIDE ER 2.5 MG PO TB24
2.5000 mg | ORAL_TABLET | Freq: Every day | ORAL | Status: DC
  Administered 2021-08-28 – 2021-08-29 (×2): 2.5 mg via ORAL
  Filled 2021-08-28 (×2): qty 1

## 2021-08-28 MED ORDER — FLEET ENEMA 7-19 GM/118ML RE ENEM: 1.0000 | ENEMA | Freq: Once | RECTAL | Status: DC | PRN

## 2021-08-28 MED ORDER — ONDANSETRON HCL 4 MG PO TABS: 4.0000 mg | ORAL_TABLET | Freq: Four times a day (QID) | ORAL | Status: DC | PRN

## 2021-08-28 MED ORDER — PHENYLEPHRINE HCL-NACL 20-0.9 MG/250ML-% IV SOLN
INTRAVENOUS | Status: DC | PRN
  Administered 2021-08-28: 20 ug/min via INTRAVENOUS

## 2021-08-28 MED ORDER — ALUM & MAG HYDROXIDE-SIMETH 200-200-20 MG/5ML PO SUSP: 30.0000 mL | ORAL | Status: DC | PRN

## 2021-08-28 MED ORDER — MELOXICAM 7.5 MG PO TABS
15.0000 mg | ORAL_TABLET | Freq: Once | ORAL | Status: AC
Start: 2021-08-28 — End: 2021-08-28

## 2021-08-28 MED ORDER — LORATADINE 10 MG PO TABS
10.0000 mg | ORAL_TABLET | Freq: Every day | ORAL | Status: DC
  Administered 2021-08-28 – 2021-08-29 (×2): 10 mg via ORAL
  Filled 2021-08-28 (×2): qty 1

## 2021-08-28 SURGICAL SUPPLY — 74 items
ATTUNE MED DOME PAT 41 KNEE (Knees) ×1 IMPLANT
ATTUNE PS FEM LT SZ 6 CEM KNEE (Femur) ×1 IMPLANT
ATTUNE PSRP INSR SZ6 8 KNEE (Insert) ×1 IMPLANT
BASE TIBIA ATTUNE KNEE SYS SZ6 (Knees) IMPLANT
BATTERY INSTRU NAVIGATION (MISCELLANEOUS) ×8 IMPLANT
BLADE CLIPPER SURG (BLADE) ×1 IMPLANT
BLADE SAW 70X12.5 (BLADE) ×2 IMPLANT
BLADE SAW 90X13X1.19 OSCILLAT (BLADE) ×2 IMPLANT
BLADE SAW 90X25X1.19 OSCILLAT (BLADE) ×2 IMPLANT
BONE CEMENT GENTAMICIN (Cement) ×4 IMPLANT
CEMENT BONE GENTAMICIN 40 (Cement) IMPLANT
COOLER POLAR GLACIER W/PUMP (MISCELLANEOUS) ×2 IMPLANT
CUFF TOURN SGL QUICK 24 (TOURNIQUET CUFF)
CUFF TOURN SGL QUICK 34 (TOURNIQUET CUFF)
CUFF TRNQT CYL 24X4X16.5-23 (TOURNIQUET CUFF) IMPLANT
CUFF TRNQT CYL 34X4.125X (TOURNIQUET CUFF) IMPLANT
DRAPE 3/4 80X56 (DRAPES) ×2 IMPLANT
DRAPE INCISE IOBAN 66X45 STRL (DRAPES) IMPLANT
DRSG DERMACEA NONADH 3X8 (GAUZE/BANDAGES/DRESSINGS) ×2 IMPLANT
DRSG MEPILEX SACRM 8.7X9.8 (GAUZE/BANDAGES/DRESSINGS) ×2 IMPLANT
DRSG OPSITE POSTOP 4X14 (GAUZE/BANDAGES/DRESSINGS) ×2 IMPLANT
DRSG TEGADERM 4X4.75 (GAUZE/BANDAGES/DRESSINGS) ×2 IMPLANT
DURAPREP 26ML APPLICATOR (WOUND CARE) ×4 IMPLANT
ELECT CAUTERY BLADE 6.4 (BLADE) ×2 IMPLANT
ELECT REM PT RETURN 9FT ADLT (ELECTROSURGICAL) ×2
ELECTRODE REM PT RTRN 9FT ADLT (ELECTROSURGICAL) ×1 IMPLANT
EX-PIN ORTHOLOCK NAV 4X150 (PIN) ×4 IMPLANT
GLOVE BIO SURGEON STRL SZ7 (GLOVE) ×4 IMPLANT
GLOVE BIOGEL M STRL SZ7.5 (GLOVE) ×4 IMPLANT
GLOVE BIOGEL PI IND STRL 8 (GLOVE) ×1 IMPLANT
GLOVE BIOGEL PI INDICATOR 8 (GLOVE) ×1
GLOVE SURG UNDER POLY LF SZ7.5 (GLOVE) ×2 IMPLANT
GOWN STRL REUS W/ TWL LRG LVL3 (GOWN DISPOSABLE) ×2 IMPLANT
GOWN STRL REUS W/ TWL XL LVL3 (GOWN DISPOSABLE) ×1 IMPLANT
GOWN STRL REUS W/TWL LRG LVL3 (GOWN DISPOSABLE) ×2
GOWN STRL REUS W/TWL XL LVL3 (GOWN DISPOSABLE) ×1
HEMOVAC 400CC 10FR (MISCELLANEOUS) ×2 IMPLANT
HOLDER FOLEY CATH W/STRAP (MISCELLANEOUS) ×2 IMPLANT
HOLSTER ELECTROSUGICAL PENCIL (MISCELLANEOUS) ×2 IMPLANT
HOOD PEEL AWAY FLYTE STAYCOOL (MISCELLANEOUS) ×4 IMPLANT
IV NS IRRIG 3000ML ARTHROMATIC (IV SOLUTION) ×2 IMPLANT
KIT TURNOVER KIT A (KITS) ×2 IMPLANT
KNIFE SCULPS 14X20 (INSTRUMENTS) ×2 IMPLANT
MANIFOLD NEPTUNE II (INSTRUMENTS) ×4 IMPLANT
NDL SPNL 20GX3.5 QUINCKE YW (NEEDLE) ×2 IMPLANT
NEEDLE SPNL 20GX3.5 QUINCKE YW (NEEDLE) ×4 IMPLANT
NS IRRIG 500ML POUR BTL (IV SOLUTION) ×2 IMPLANT
PACK TOTAL KNEE (MISCELLANEOUS) ×2 IMPLANT
PAD ABD DERMACEA PRESS 5X9 (GAUZE/BANDAGES/DRESSINGS) ×4 IMPLANT
PAD WRAPON POLAR KNEE (MISCELLANEOUS) ×1 IMPLANT
PIN DRILL FIX HALF THREAD (BIT) ×4 IMPLANT
PIN FIXATION 1/8DIA X 3INL (PIN) ×2 IMPLANT
PULSAVAC PLUS IRRIG FAN TIP (DISPOSABLE) ×2
SOL PREP PVP 2OZ (MISCELLANEOUS) ×2
SOLUTION IRRIG SURGIPHOR (IV SOLUTION) ×2 IMPLANT
SOLUTION PREP PVP 2OZ (MISCELLANEOUS) ×1 IMPLANT
SPONGE DRAIN TRACH 4X4 STRL 2S (GAUZE/BANDAGES/DRESSINGS) ×2 IMPLANT
STAPLER SKIN PROX 35W (STAPLE) ×2 IMPLANT
STOCKINETTE IMPERV 14X48 (MISCELLANEOUS) ×1 IMPLANT
STRAP TIBIA SHORT (MISCELLANEOUS) ×2 IMPLANT
SUCTION FRAZIER HANDLE 10FR (MISCELLANEOUS) ×1
SUCTION TUBE FRAZIER 10FR DISP (MISCELLANEOUS) ×1 IMPLANT
SUT VIC AB 0 CT1 36 (SUTURE) ×4 IMPLANT
SUT VIC AB 1 CT1 36 (SUTURE) ×4 IMPLANT
SUT VIC AB 2-0 CT2 27 (SUTURE) ×2 IMPLANT
SYR 30ML LL (SYRINGE) ×4 IMPLANT
TIBIA ATTUNE KNEE SYS BASE SZ6 (Knees) ×2 IMPLANT
TIP FAN IRRIG PULSAVAC PLUS (DISPOSABLE) ×1 IMPLANT
TOWEL OR 17X26 4PK STRL BLUE (TOWEL DISPOSABLE) IMPLANT
TOWER CARTRIDGE SMART MIX (DISPOSABLE) ×2 IMPLANT
TRAY FOLEY MTR SLVR 16FR STAT (SET/KITS/TRAYS/PACK) ×2 IMPLANT
WATER STERILE IRR 1000ML POUR (IV SOLUTION) ×1 IMPLANT
WATER STERILE IRR 500ML POUR (IV SOLUTION) ×1 IMPLANT
WRAPON POLAR PAD KNEE (MISCELLANEOUS) ×2

## 2021-08-28 NOTE — Anesthesia Procedure Notes (Addendum)
Spinal  Patient location during procedure: OR Start time: 08/28/2021 8:27 AM End time: 08/28/2021 8:29 AM Reason for block: surgical anesthesia Staffing Performed: resident/CRNA  Resident/CRNA: Nelda Marseille, CRNA Performed by: Nelda Marseille, CRNA Authorized by: Andria Frames, MD   Preanesthetic Checklist Completed: patient identified, IV checked, site marked, risks and benefits discussed, surgical consent, monitors and equipment checked, pre-op evaluation and timeout performed Spinal Block Patient position: sitting Prep: ChloraPrep Patient monitoring: heart rate, continuous pulse ox, blood pressure and cardiac monitor Approach: midline Location: L3-4 Injection technique: single-shot Needle Needle type: Whitacre and Introducer  Needle gauge: 25 G Needle length: 9 cm Assessment Sensory level: T10 Events: CSF return Additional Notes Sterile aseptic technique used throughout the procedure.  Negative paresthesia. Negative blood return. Positive free-flowing CSF. Expiration date of kit checked and confirmed. Patient tolerated procedure well, without complications.

## 2021-08-28 NOTE — Op Note (Signed)
OPERATIVE NOTE  DATE OF SURGERY:  08/28/2021  PATIENT NAME:  EZMERALDA STEFANICK   DOB: December 23, 1950  MRN: [id number]  PRE-OPERATIVE DIAGNOSIS: Degenerative arthrosis of the left knee, primary  POST-OPERATIVE DIAGNOSIS:  Same  PROCEDURE:  Left total knee arthroplasty using computer-assisted navigation  SURGEON:  Marciano Sequin. M.D.  ASSISTANT: Cassell Smiles, PA-C (present and scrubbed throughout the case, critical for assistance with exposure, retraction, instrumentation, and closure)  ANESTHESIA: spinal  ESTIMATED BLOOD LOSS: 50 mL  FLUIDS REPLACED: 800 mL of crystalloid  TOURNIQUET TIME: 83 minutes  DRAINS: 2 medium Hemovac drains  SOFT TISSUE RELEASES: Anterior cruciate ligament, posterior cruciate ligament, deep medial collateral ligament, patellofemoral ligament  IMPLANTS UTILIZED: DePuy Attune size 6 posterior stabilized femoral component (cemented), size 6 rotating platform tibial component (cemented), 41 mm medialized dome patella (cemented), and an 8 mm stabilized rotating platform polyethylene insert.  INDICATIONS FOR SURGERY: DIANNA EWALD is a 71 y.o. year old female with a long history of progressive knee pain. X-rays demonstrated severe degenerative changes in tricompartmental fashion. The patient had not seen any significant improvement despite conservative nonsurgical intervention. After discussion of the risks and benefits of surgical intervention, the patient expressed understanding of the risks benefits and agree with plans for total knee arthroplasty.   The risks, benefits, and alternatives were discussed at length including but not limited to the risks of infection, bleeding, nerve injury, stiffness, blood clots, the need for revision surgery, cardiopulmonary complications, among others, and they were willing to proceed.  PROCEDURE IN DETAIL: The patient was brought into the operating room and, after adequate spinal anesthesia was achieved, a tourniquet was  placed on the patient's upper thigh. The patient's knee and leg were cleaned and prepped with alcohol and DuraPrep and draped in the usual sterile fashion. A "timeout" was performed as per usual protocol. The lower extremity was exsanguinated using an Esmarch, and the tourniquet was inflated to 300 mmHg. An anterior longitudinal incision was made followed by a standard mid vastus approach. The deep fibers of the medial collateral ligament were elevated in a subperiosteal fashion off of the medial flare of the tibia so as to maintain a continuous soft tissue sleeve. The patella was subluxed laterally and the patellofemoral ligament was incised. Inspection of the knee demonstrated severe degenerative changes with full-thickness loss of articular cartilage. Osteophytes were debrided using a rongeur. Anterior and posterior cruciate ligaments were excised. Two 4.0 mm Schanz pins were inserted in the femur and into the tibia for attachment of the array of trackers used for computer-assisted navigation. Hip center was identified using a circumduction technique. Distal landmarks were mapped using the computer. The distal femur and proximal tibia were mapped using the computer. The distal femoral cutting guide was positioned using computer-assisted navigation so as to achieve a 5 distal valgus cut. The femur was sized and it was felt that a size 6 femoral component was appropriate. A size 6 femoral cutting guide was positioned and the anterior cut was performed and verified using the computer. This was followed by completion of the posterior and chamfer cuts. Femoral cutting guide for the central box was then positioned in the center box cut was performed.  Attention was then directed to the proximal tibia. Medial and lateral menisci were excised. The extramedullary tibial cutting guide was positioned using computer-assisted navigation so as to achieve a 0 varus-valgus alignment and 3 posterior slope. The cut was  performed and verified using the computer. The proximal tibia was  sized and it was felt that a size 6 tibial tray was appropriate. Tibial and femoral trials were inserted followed by insertion of an 8 mm polyethylene insert. This allowed for excellent mediolateral soft tissue balancing both in flexion and in full extension. Finally, the patella was cut and prepared so as to accommodate a 41 mm medialized dome patella. A patella trial was placed and the knee was placed through a range of motion with excellent patellar tracking appreciated. The femoral trial was removed after debridement of posterior osteophytes. The central post-hole for the tibial component was reamed followed by insertion of a keel punch. Tibial trials were then removed. Cut surfaces of bone were irrigated with copious amounts of normal saline using pulsatile lavage and then suctioned dry. Polymethylmethacrylate cement with gentamicin was prepared in the usual fashion using a vacuum mixer. Cement was applied to the cut surface of the proximal tibia as well as along the undersurface of a size 6 rotating platform tibial component. Tibial component was positioned and impacted into place. Excess cement was removed using [employer] fast streamer. Cement was then applied to the cut surfaces of the femur as well as along the posterior flanges of the size 6 femoral component. The femoral component was positioned and impacted into place. Excess cement was removed using [employer] fast streamer. An8 mm polyethylene trial was inserted and the knee was brought into full extension with steady axial compression applied. Finally, cement was applied to the backside of a 41 mm medialized dome patella and the patellar component was positioned and patellar clamp applied. Excess cement was removed using [employer] fast streamer. After adequate curing of the cement, the tourniquet was deflated after a total tourniquet time of 83 minutes. Hemostasis was achieved using electrocautery. The knee was  irrigated with copious amounts of normal saline using pulsatile lavage followed by 450 ml of Surgiphor and then suctioned dry. 20 mL of 1.3% Exparel and 60 mL of 0.25% Marcaine in 40 mL of normal saline was injected along the posterior capsule, medial and lateral gutters, and along the arthrotomy site. An 8 mm stabilized rotating platform polyethylene insert was inserted and the knee was placed through a range of motion with excellent mediolateral soft tissue balancing appreciated and excellent patellar tracking noted. 2 medium drains were placed in the wound bed and brought out through separate stab incisions. The medial parapatellar portion of the incision was reapproximated using interrupted sutures of #1 Vicryl. Subcutaneous tissue was approximated in layers using first #0 Vicryl followed #2-0 Vicryl. The skin was approximated with skin staples. A sterile dressing was applied.  The patient tolerated the procedure well and was transported to the recovery room in stable condition.    Anaiya Wisinski P. Holley Bouche., M.D.

## 2021-08-28 NOTE — TOC Initial Note (Signed)
Transition of Care [facility]) - Initial/Assessment Note    Patient Details  Name: Michelle Montgomery MRN: [id number] Date of Birth: 1950/04/10  Transition of Care [facility]) CM/SW Contact:    Michelle Oms, RN Phone Number: 08/28/2021, 11:30 AM  Clinical Narrative:                 [facility] accepted the patient for [facility] services prior to Surgery        Patient Goals and CMS Choice        Expected Discharge Plan and Services                                                Prior Living Arrangements/Services                       Activities of Daily Living      Permission Sought/Granted                  Emotional Assessment              Admission diagnosis:  Total knee replacement status [Z96.659] Patient Active Problem List   Diagnosis Date Noted   Osteopenia after menopause 06/18/2021   Total knee replacement status 04/05/2021   Primary osteoarthritis of left knee 03/11/2021   Varicose veins of both lower extremities 06/12/2016   BPV (benign positional vertigo), right 08/24/2015   Smell disturbance 01/16/2015   Osteoarthritis 11/03/2014   Cramps of lower extremity 11/03/2014   Neuralgia neuritis, sciatic nerve 11/03/2014   Insomnia, persistent 11/03/2014   Dyslipidemia 11/03/2014   Type 2 diabetes with nephropathy ([place]) 11/03/2014   Essential (primary) hypertension 11/03/2014   History of colon polyps 11/03/2014   H/O malignant neoplasm of skin 11/03/2014   Microalbuminuria 11/03/2014   Plantar fasciitis 11/03/2014   Gastro-esophageal reflux disease without esophagitis 11/03/2014   Bundle branch block, right 11/03/2014   PCP:  Michelle Sizer, MD Pharmacy:   [facility] [place] [place] [facility] [address] [place] [address] Phone: [phone] Fax: [place] [phone] Michelle Montgomery, [place] [address] Phone: [phone] Fax: [phone]     Social Determinants of Health (SDOH) Interventions    Readmission Risk Interventions     No data to display

## 2021-08-28 NOTE — Evaluation (Signed)
Physical Therapy Evaluation Patient Details Name: Michelle Montgomery MRN: [id number] DOB: [birth year] Today's Date: 08/28/2021  History of Present Illness  Pt is 71 y.o. female s/p L TKA.  Pt has PMH including: arthritis, arthrosis, B leg cramps, B sciatica, basil saronoma, GERD, HTN, obesity, R TKA.   Clinical Impression  Pt received in Semi-Fowler's position and agreeable to therapy.  Pt ready to attempt ambulation as she knows what to expect due to having R TKA performed earlier this year.  Pt able to safely navigate with good technique to sitting at EOB.  Pt has limited pain and notes her pain meds have assisted with keeping it under control.  Pt able to perform transfers with min guard for safety with first attempt and requires the bed surface to be elevated due to height of the pt.  Pt ambulated in room well, and returned to the bed.  Anticipate pt able to ambulate around the nursing station and perform stair training tomorrow prior to d/c.  Pt left with all needs met and call bell within reach. Anticipated discharge plans to home with HHPT is appropriate at this time.  Pt will continue to benefit from skilled therapy in order to address deficits listed below.        Recommendations for follow up therapy are one component of a multi-disciplinary discharge planning process, led by the attending physician.  Recommendations may be updated based on patient status, additional functional criteria and insurance authorization.  Follow Up Recommendations Home health PT      Assistance Recommended at Discharge PRN  Patient can return home with the following  A little help with walking and/or transfers;A little help with bathing/dressing/bathroom;Assist for transportation;Help with stairs or ramp for entrance    Equipment Recommendations None recommended by PT  Recommendations for Other Services       Functional Status Assessment Patient has had a recent decline in their functional status and  demonstrates the ability to make significant improvements in function in a reasonable and predictable amount of time.     Precautions / Restrictions Precautions Precautions: Knee Precaution Booklet Issued: No Restrictions Weight Bearing Restrictions: Yes LLE Weight Bearing: Weight bearing as tolerated      Mobility  Bed Mobility Overal bed mobility: Modified Independent             General bed mobility comments: extra time and use of hand-rails utilized.    Transfers Overall transfer level: Needs assistance Equipment used: Rolling walker (2 wheels) Transfers: Sit to/from Stand Sit to Stand: Min guard, From elevated surface           General transfer comment: due to pt's height, pt unable to rise from lowered bed.    Ambulation/Gait Ambulation/Gait assistance: Min guard Gait Distance (Feet): 10 Feet Assistive device: Rolling walker (2 wheels) Gait Pattern/deviations: WFL(Within Functional Limits), Step-through pattern, Decreased step length - right, Decreased stance time - left Gait velocity: slightly decreased     General Gait Details: Pt moves well with use of FWW  Stairs            Wheelchair Mobility    Modified Rankin (Stroke Patients Only)       Balance Overall balance assessment: Needs assistance Sitting-balance support: No upper extremity supported, Feet supported Sitting balance-Leahy Scale: Good     Standing balance support: Bilateral upper extremity supported, During functional activity Standing balance-Leahy Scale: Fair Standing balance comment: Pt uses FWW for support in standing s/p surgical intervention.  Pertinent Vitals/Pain Pain Assessment Pain Assessment: Faces Faces Pain Scale: Hurts a little bit Pain Location: L Knee at surgical site. Pain Descriptors / Indicators: Aching Pain Intervention(s): Limited activity within patient's tolerance, Monitored during session, Premedicated  before session, Repositioned    Home Living Family/patient expects to be discharged to:: Private residence Living Arrangements: Spouse/significant other Available Help at Discharge: Family Type of Home: House Home Access: Other (comment) (can enter through the back, threshold only)       Home Layout: One level Home Equipment: Conservation officer, nature (2 wheels);BSC/3in1      Prior Function Prior Level of Function : Independent/Modified Independent             Mobility Comments: Indep with ADLs, household and community mobiliztion without assist device; denies fall history       Hand Dominance   Dominant Hand: Right    Extremity/Trunk Assessment   Upper Extremity Assessment Upper Extremity Assessment: Overall WFL for tasks assessed    Lower Extremity Assessment Lower Extremity Assessment: Overall WFL for tasks assessed;LLE deficits/detail LLE Deficits / Details: weakness s/p surgical intervention.       Communication   Communication: No difficulties  Cognition Arousal/Alertness: Awake/alert Behavior During Therapy: WFL for tasks assessed/performed Overall Cognitive Status: Within Functional Limits for tasks assessed                                          General Comments      Exercises     Assessment/Plan    PT Assessment Patient needs continued PT services  PT Problem List Decreased strength;Decreased activity tolerance;Decreased balance;Decreased mobility;Pain       PT Treatment Interventions DME instruction;Gait training;Stair training;Functional mobility training;Therapeutic activities;Therapeutic exercise;Balance training;Neuromuscular re-education    PT Goals (Current goals can be found in the Care Plan section)  Acute Rehab PT Goals Patient Stated Goal: to go home tomorrow (08/29/21). PT Goal Formulation: With patient Time For Goal Achievement: 09/11/21 Potential to Achieve Goals: Good    Frequency BID     Co-evaluation                AM-PAC PT "6 Clicks" Mobility  Outcome Measure Help needed turning from your back to your side while in a flat bed without using bedrails?: A Little Help needed moving from lying on your back to sitting on the side of a flat bed without using bedrails?: A Little Help needed moving to and from a bed to a chair (including a wheelchair)?: A Little Help needed standing up from a chair using your arms (e.g., wheelchair or bedside chair)?: A Little Help needed to walk in hospital room?: A Little Help needed climbing 3-5 steps with a railing? : A Lot 6 Click Score: 17    End of Session Equipment Utilized During Treatment: Gait belt Activity Tolerance: Patient tolerated treatment well Patient left: in bed;with call bell/phone within reach;with bed alarm set;with family/visitor present Nurse Communication: Mobility status PT Visit Diagnosis: Other abnormalities of gait and mobility (R26.89);Muscle weakness (generalized) (M62.81);Difficulty in walking, not elsewhere classified (R26.2);Pain Pain - Right/Left: Left Pain - part of body: Knee    Time: 1638-4536 PT Time Calculation (min) (ACUTE ONLY): 29 min   Charges:   PT Evaluation $PT Eval Low Complexity: 1 Low PT Treatments $Gait Training: 8-22 mins        Gwenlyn Saran, PT, DPT 08/28/21, 4:31 PM

## 2021-08-28 NOTE — Anesthesia Preprocedure Evaluation (Signed)
Anesthesia Evaluation  Patient identified by MRN, date of birth, ID band Patient awake    Reviewed: Allergy & Precautions, NPO status , Patient's Chart, lab work & pertinent test results  History of Anesthesia Complications (+) PONV, Family history of anesthesia reaction and history of anesthetic complications  Airway Mallampati: III  TM Distance: <3 FB Neck ROM: full    Dental  (+) Chipped   Pulmonary neg shortness of breath, former smoker,    Pulmonary exam normal        Cardiovascular Exercise Tolerance: Good hypertension, Normal cardiovascular exam+ dysrhythmias      Neuro/Psych  Neuromuscular disease negative psych ROS   GI/Hepatic Neg liver ROS, GERD  Controlled,  Endo/Other  negative endocrine ROSdiabetes  Renal/GU Renal disease  negative genitourinary   Musculoskeletal   Abdominal   Peds  Hematology negative hematology ROS (+)   Anesthesia Other Findings Past Medical History: No date: Arthritis No date: Arthrosis No date: Bilateral leg cramps No date: Bilateral sciatica No date: Cancer (HCC)     Comment:  basil, [place] No date: Chronic insomnia No date: Diabetes mellitus without complication (HCC) No date: Dyslipidemia No date: Family history of adverse reaction to anesthesia     Comment:  mom had PONV No date: GERD (gastroesophageal reflux disease) No date: History of colon polyps No date: History of skin cancer     Comment:  Dr. Ave Filter, history of basal cell and squamous               cells carcinoma No date: Hypertension No date: Microalbuminuria     Comment:  100-DM No date: Obesity No date: Plantar fasciitis, left     Comment:  Dr. Milinda Pointer No date: Pneumonia No date: PONV (postoperative nausea and vomiting) No date: Reflux No date: Right bundle branch block  Past Surgical History: No date: achiles tendon; Left 2015: BREAST BIOPSY; Left     Comment:  benign 08/29/2011: FOOT  SURGERY; Left     Comment:  Spur Removal , and Achilles Tendon Tendolysis 04/05/2021: KNEE ARTHROPLASTY; Right     Comment:  Procedure: COMPUTER ASSISTED TOTAL KNEE ARTHROPLASTY;                Surgeon: Dereck Leep, MD;  Location: [facility] ORS;                Service: Orthopedics;  Laterality: Right; No date: VARICOSE VEIN SURGERY; Bilateral  BMI    Body Mass Index: 34.04 kg/m      Reproductive/Obstetrics negative OB ROS                             Anesthesia Physical Anesthesia Plan  ASA: 3  Anesthesia Plan: Spinal   Post-op Pain Management:    Induction:   PONV Risk Score and Plan: Propofol infusion and TIVA  Airway Management Planned: Natural Airway and Nasal Cannula  Additional Equipment:   Intra-op Plan:   Post-operative Plan:   Informed Consent: I have reviewed the patients History and Physical, chart, labs and discussed the procedure including the risks, benefits and alternatives for the proposed anesthesia with the patient or authorized representative who has indicated his/her understanding and acceptance.     Dental Advisory Given  Plan Discussed with: Anesthesiologist, CRNA and Surgeon  Anesthesia Plan Comments: (Patient reports no bleeding problems and no anticoagulant use.  Plan for spinal with backup GA  Patient consented for risks of anesthesia including but not  limited to:  - adverse reactions to medications - damage to eyes, teeth, lips or other oral mucosa - nerve damage due to positioning  - risk of bleeding, infection and or nerve damage from spinal that could lead to paralysis - risk of headache or failed spinal - damage to teeth, lips or other oral mucosa - sore throat or hoarseness - damage to heart, brain, nerves, lungs, other parts of body or loss of life  Patient voiced understanding.)        Anesthesia Quick Evaluation

## 2021-08-28 NOTE — Progress Notes (Signed)
Patient arrived to pacu awake/alert x4. Received spinal anesthesia for right knee arthroplasty. Upon arrival able to move bil lower ext, sensation intact, pulses +2. Xray completed. Patient tolerated crackers and gingerale. Family updated. Verbalizes understanding of procedure and plan of care. PT updated.

## 2021-08-28 NOTE — H&P (Signed)
The patient has been re-examined, and the chart reviewed, and there have been no interval changes to the documented history and physical.    The risks, benefits, and alternatives have been discussed at length. The patient expressed understanding of the risks benefits and agreed with plans for surgical intervention.  Suttyn Cryder P. Wallace Cogliano, Jr. M.D.    

## 2021-08-28 NOTE — Transfer of Care (Signed)
Immediate Anesthesia Transfer of Care Note  Patient: Michelle Montgomery  Procedure(s) Performed: COMPUTER ASSISTED TOTAL KNEE ARTHROPLASTY (Left: Knee)  Patient Location: PACU  Anesthesia Type:Spinal  Level of Consciousness: drowsy  Airway & Oxygen Therapy: Patient Spontanous Breathing and Patient connected to face mask oxygen  Post-op Assessment: Report given to RN and Post -op Vital signs reviewed and stable  Post vital signs: Reviewed and stable  Last Vitals:  Vitals Value Taken Time  BP 100/76 08/28/21 1205  Temp    Pulse 79 08/28/21 1208  Resp 15 08/28/21 1208  SpO2 92 % 08/28/21 1208  Vitals shown include unvalidated device data.  Last Pain:  Vitals:   08/28/21 0618  TempSrc: Temporal  PainSc: 0-No pain         Complications: No notable events documented.

## 2021-08-29 ENCOUNTER — Encounter: Payer: Self-pay | Admitting: Orthopedic Surgery

## 2021-08-29 DIAGNOSIS — Z7984 Long term (current) use of oral hypoglycemic drugs: Secondary | ICD-10-CM | POA: Diagnosis not present

## 2021-08-29 DIAGNOSIS — E119 Type 2 diabetes mellitus without complications: Secondary | ICD-10-CM | POA: Diagnosis not present

## 2021-08-29 DIAGNOSIS — Z7982 Long term (current) use of aspirin: Secondary | ICD-10-CM | POA: Diagnosis not present

## 2021-08-29 DIAGNOSIS — Z96651 Presence of right artificial knee joint: Secondary | ICD-10-CM | POA: Diagnosis not present

## 2021-08-29 DIAGNOSIS — I10 Essential (primary) hypertension: Secondary | ICD-10-CM | POA: Diagnosis not present

## 2021-08-29 DIAGNOSIS — Z87891 Personal history of nicotine dependence: Secondary | ICD-10-CM | POA: Diagnosis not present

## 2021-08-29 DIAGNOSIS — Z85828 Personal history of other malignant neoplasm of skin: Secondary | ICD-10-CM | POA: Diagnosis not present

## 2021-08-29 DIAGNOSIS — M1712 Unilateral primary osteoarthritis, left knee: Secondary | ICD-10-CM | POA: Diagnosis not present

## 2021-08-29 DIAGNOSIS — Z79899 Other long term (current) drug therapy: Secondary | ICD-10-CM | POA: Diagnosis not present

## 2021-08-29 LAB — GLUCOSE, CAPILLARY
Glucose-Capillary: 153 mg/dL — ABNORMAL HIGH (ref 70–99)
Glucose-Capillary: 154 mg/dL — ABNORMAL HIGH (ref 70–99)

## 2021-08-29 MED ORDER — OXYCODONE HCL 5 MG PO TABS: 5.0000 mg | ORAL_TABLET | ORAL | 0 refills | Status: DC | PRN

## 2021-08-29 MED ORDER — TRAMADOL HCL 50 MG PO TABS: 50.0000 mg | ORAL_TABLET | ORAL | 0 refills | Status: DC | PRN

## 2021-08-29 MED ORDER — ENOXAPARIN SODIUM 40 MG/0.4ML IJ SOSY: 40.0000 mg | PREFILLED_SYRINGE | INTRAMUSCULAR | 0 refills | Status: DC

## 2021-08-29 NOTE — Evaluation (Signed)
Occupational Therapy Evaluation Patient Details Name: Michelle Montgomery MRN: [id number] DOB: [birth year] Today's Date: 08/29/2021   History of Present Illness Pt is 71 y.o. female s/p L TKA.  Pt has PMH including: arthritis, arthrosis, B leg cramps, B sciatica, basil saronoma, GERD, HTN, obesity, R TKA.   Clinical Impression   Pt was seen for OT evaluation this date. Prior to hospital admission, pt was driving, independent with mobility and ADLs. Pt lives with spouse in a one level house and is able to enter through the back with a low threshold. Pt reported spouse can provide 24/7 assistance if needed. Pt presents to acute OT demonstrating impaired ADL performance and functional mobility 2/2 decreased activity tolerance and functional strength/ROM/balance deficits.   Pt currently requires MOD I for bed mobility and SUPERVISION from elevated surface for STS. MAX A for donning compression stockings while seated. MOD A for donning/doffing socks while seated. SUPERVISION with min vcs for safe hand placement and extending L LE forward for toilet t/f and pericare while seated. SBA for grooming tasks at the sink with no UE support. Pt educated on safe ADL participation, polar care, and compression stockings. All education complete, will sign off. Upon hospital discharge, recommend no OT follow up.     Recommendations for follow up therapy are one component of a multi-disciplinary discharge planning process, led by the attending physician.  Recommendations may be updated based on patient status, additional functional criteria and insurance authorization.   Follow Up Recommendations  No OT follow up    Assistance Recommended at Discharge Intermittent Supervision/Assistance  Patient can return home with the following A little help with walking and/or transfers;A lot of help with bathing/dressing/bathroom;Assistance with cooking/housework;Assist for transportation    Functional Status Assessment   Patient has had a recent decline in their functional status and demonstrates the ability to make significant improvements in function in a reasonable and predictable amount of time.  Equipment Recommendations  None recommended by OT    Recommendations for Other Services       Precautions / Restrictions Precautions Precautions: Knee;Fall Precaution Booklet Issued: No Restrictions Weight Bearing Restrictions: Yes LLE Weight Bearing: Weight bearing as tolerated      Mobility Bed Mobility Overal bed mobility: Modified Independent                  Transfers Overall transfer level: Needs assistance Equipment used: Rolling walker (2 wheels) Transfers: Sit to/from Stand Sit to Stand: Supervision, From elevated surface                  Balance Overall balance assessment: Needs assistance Sitting-balance support: No upper extremity supported, Feet supported Sitting balance-Leahy Scale: Good     Standing balance support: No upper extremity supported, During functional activity Standing balance-Leahy Scale: Good Standing balance comment: No UE support to wash hands in standing at the sink.                           ADL either performed or assessed with clinical judgement   ADL Overall ADL's : Needs assistance/impaired                                       General ADL Comments: MAX A for donning compression stockings while seated. MOD A for donning/doffing socks while seated. SUPERVISION with min vcs for safe hand placement for  toilet t/f and pericare while seated. SBA for grooming tasks at the sink with no UE support.      Pertinent Vitals/Pain Pain Assessment Pain Assessment: Faces Faces Pain Scale: Hurts a little bit Pain Location: L knee Pain Descriptors / Indicators: Discomfort, Grimacing, Sore Pain Intervention(s): Limited activity within patient's tolerance, Monitored during session, Repositioned, Ice applied     Hand  Dominance     Extremity/Trunk Assessment Upper Extremity Assessment Upper Extremity Assessment: Overall WFL for tasks assessed   Lower Extremity Assessment Lower Extremity Assessment: Generalized weakness (L TKA)       Communication Communication Communication: No difficulties   Cognition Arousal/Alertness: Awake/alert Behavior During Therapy: WFL for tasks assessed/performed Overall Cognitive Status: Within Functional Limits for tasks assessed                                       General Comments  Author issued HEP and pt demonstrated ability to I'ly perform    Exercises     Shoulder Instructions      Home Living Family/patient expects to be discharged to:: Private residence Living Arrangements: Spouse/significant other Available Help at Discharge: Family;Available 24 hours/day Type of Home: House Home Access: Other (comment) (can enter through the back, threshold only)     Home Layout: One level     Bathroom Shower/Tub: Occupational psychologist: Handicapped height     Home Equipment: Sawyer Agricultural consultant (2 wheels);Cane - single point;Grab bars - tub/shower          Prior Functioning/Environment Prior Level of Function : Independent/Modified Independent;Driving             Mobility Comments: No AD for mobility ADLs Comments: Independent with ADLs        OT Problem List: Decreased strength;Decreased range of motion;Decreased activity tolerance      OT Treatment/Interventions:      OT Goals(Current goals can be found in the care plan section) Acute Rehab OT Goals Patient Stated Goal: to go home OT Goal Formulation: With patient Time For Goal Achievement: 09/12/21 Potential to Achieve Goals: Good  OT Frequency:         AM-PAC OT "6 Clicks" Daily Activity     Outcome Measure Help from another person eating meals?: None Help from another person taking care of personal grooming?: A Little Help  from another person toileting, which includes using toliet, bedpan, or urinal?: None Help from another person bathing (including washing, rinsing, drying)?: A Lot Help from another person to put on and taking off regular upper body clothing?: None Help from another person to put on and taking off regular lower body clothing?: A Lot 6 Click Score: 19   End of Session Equipment Utilized During Treatment: Gait belt;Rolling walker (2 wheels)  Activity Tolerance: Patient tolerated treatment well Patient left: in bed;with call bell/phone within reach;with bed alarm set;with SCD's reapplied;Other (comment) (polar care reapplied)  OT Visit Diagnosis: Muscle weakness (generalized) (M62.81)                Time: 7902-4097 OT Time Calculation (min): 29 min Charges:  OT General Charges $OT Visit: 1 Visit OT Evaluation $OT Eval Low Complexity: 1 Low OT Treatments $Self Care/Home Management : 8-22 mins  [employer], OTDS  [employer] 08/29/2021, 12:18 PM

## 2021-08-29 NOTE — Anesthesia Postprocedure Evaluation (Signed)
Anesthesia Post Note  Patient: Michelle Montgomery  Procedure(s) Performed: COMPUTER ASSISTED TOTAL KNEE ARTHROPLASTY (Left: Knee)  Patient location during evaluation: Nursing Unit Anesthesia Type: Spinal Level of consciousness: awake, oriented and awake and alert Pain management: pain level controlled Vital Signs Assessment: post-procedure vital signs reviewed and stable Respiratory status: spontaneous breathing, nonlabored ventilation and respiratory function stable Cardiovascular status: blood pressure returned to baseline and stable Postop Assessment: no headache and no backache Anesthetic complications: no   No notable events documented.   Last Vitals:  Vitals:   08/28/21 2032 08/29/21 0332  BP: (!) 106/58 110/65  Pulse: 62 (!) 56  Resp: 18 18  Temp: 36.6 C 36.9 C  SpO2: 95% 94%    Last Pain:  Vitals:   08/28/21 2034  TempSrc:   PainSc: 3                  Proofreader

## 2021-08-29 NOTE — Plan of Care (Signed)
Problem: Education: Goal: Ability to describe self-care measures that may prevent or decrease complications (Diabetes Survival Skills Education) will improve 08/29/2021 1244 by Alferd Apa, RN Outcome: Adequate for Discharge 08/29/2021 [phone] by Alferd Apa, RN Outcome: Progressing Goal: Individualized Educational Video(s) 08/29/2021 1244 by Alferd Apa, RN Outcome: Adequate for Discharge 08/29/2021 [phone] by Alferd Apa, RN Outcome: Progressing   Problem: Coping: Goal: Ability to adjust to condition or change in health will improve 08/29/2021 1244 by Alferd Apa, RN Outcome: Adequate for Discharge 08/29/2021 [phone] by Alferd Apa, RN Outcome: Progressing   Problem: Fluid Volume: Goal: Ability to maintain a balanced intake and output will improve 08/29/2021 1244 by Alferd Apa, RN Outcome: Adequate for Discharge 08/29/2021 [phone] by Alferd Apa, RN Outcome: Progressing   Problem: Health Behavior/Discharge Planning: Goal: Ability to identify and utilize available resources and services will improve 08/29/2021 1244 by Alferd Apa, RN Outcome: Adequate for Discharge 08/29/[phone] by Alferd Apa, RN Outcome: Progressing Goal: Ability to manage health-related needs will improve 08/29/2021 1244 by Alferd Apa, RN Outcome: Adequate for Discharge 08/29/2021 [phone] by Alferd Apa, RN Outcome: Progressing   Problem: Metabolic: Goal: Ability to maintain appropriate glucose levels will improve 08/29/2021 1244 by Alferd Apa, RN Outcome: Adequate for Discharge 08/29/2021 [phone] by Alferd Apa, RN Outcome: Progressing   Problem: Nutritional: Goal: Maintenance of adequate nutrition will improve 08/29/2021 1244 by Alferd Apa, RN Outcome: Adequate for Discharge 08/29/2021 [phone] by Alferd Apa, RN Outcome: Progressing Goal: Progress toward achieving an optimal weight will improve 08/29/2021 1244 by Alferd Apa, RN Outcome: Adequate for Discharge 08/29/2021 [phone] by Alferd Apa, RN Outcome: Progressing   Problem: Skin Integrity: Goal: Risk for impaired skin integrity will decrease 08/29/2021 1244 by Alferd Apa, RN Outcome: Adequate for Discharge 08/29/2021 [phone] by Alferd Apa, RN Outcome: Progressing   Problem: Tissue Perfusion: Goal: Adequacy of tissue perfusion will improve 08/29/2021 1244 by Alferd Apa, RN Outcome: Adequate for Discharge 08/29/2021 [phone] by Alferd Apa, RN Outcome: Progressing   Problem: Education: Goal: Knowledge of General Education information will improve Description: Including pain rating scale, medication(s)/side effects and non-pharmacologic comfort measures 08/29/2021 1244 by Alferd Apa, RN Outcome: Adequate for Discharge 08/29/2021 [phone] by Alferd Apa, RN Outcome: Progressing   Problem: Health Behavior/Discharge Planning: Goal: Ability to manage health-related needs will improve 08/29/2021 1244 by Alferd Apa, RN Outcome: Adequate for Discharge 08/29/2021 [phone] by Alferd Apa, RN Outcome: Progressing   Problem: Clinical Measurements: Goal: Ability to maintain clinical measurements within normal limits will improve 08/29/2021 1244 by Alferd Apa, RN Outcome: Adequate for Discharge 08/29/2021 [phone] by Alferd Apa, RN Outcome: Progressing Goal: Will remain free from infection 08/29/2021 1244 by Alferd Apa, RN Outcome: Adequate for Discharge 08/29/2021 [phone] by Alferd Apa, RN Outcome: Progressing Goal: Diagnostic test results will improve 08/29/2021 1244 by Alferd Apa, RN Outcome: Adequate for Discharge 08/29/2021 [phone] by Alferd Apa, RN Outcome: Progressing Goal: Respiratory complications will improve 08/29/2021 1244 by Alferd Apa, RN Outcome: Adequate for Discharge 08/29/2021 [phone] by Alferd Apa, RN Outcome: Progressing Goal: Cardiovascular complication will be avoided 08/29/2021 1244 by Alferd Apa, RN Outcome: Adequate for Discharge 08/29/2021 [phone] by Alferd Apa, RN Outcome:  Progressing   Problem: Activity: Goal: Risk for activity intolerance will decrease 08/29/2021 1244 by Kashmere Daywalt, Alfredo Batty, RN Outcome: Adequate for Discharge  08/29/[phone] by Alferd Apa, RN Outcome: Progressing   Problem: Nutrition: Goal: Adequate nutrition will be maintained 08/29/2021 1244 by Alferd Apa, RN Outcome: Adequate for Discharge 08/29/2021 [phone] by Alferd Apa, RN Outcome: Progressing   Problem: Coping: Goal: Level of anxiety will decrease 08/29/2021 1244 by Alferd Apa, RN Outcome: Adequate for Discharge 08/29/2021 [phone] by Alferd Apa, RN Outcome: Progressing   Problem: Elimination: Goal: Will not experience complications related to bowel motility 08/29/2021 1244 by Alferd Apa, RN Outcome: Adequate for Discharge 08/29/2021 [phone] by Alferd Apa, RN Outcome: Progressing Goal: Will not experience complications related to urinary retention 08/29/2021 1244 by Alferd Apa, RN Outcome: Adequate for Discharge 08/29/2021 [phone] by Alferd Apa, RN Outcome: Progressing   Problem: Pain Managment: Goal: General experience of comfort will improve 08/29/2021 1244 by Alferd Apa, RN Outcome: Adequate for Discharge 08/29/2021 [phone] by Alferd Apa, RN Outcome: Progressing   Problem: Safety: Goal: Ability to remain free from injury will improve 08/29/2021 1244 by Alferd Apa, RN Outcome: Adequate for Discharge 08/29/2021 [phone] by Alferd Apa, RN Outcome: Progressing   Problem: Skin Integrity: Goal: Risk for impaired skin integrity will decrease 08/29/2021 1244 by Alferd Apa, RN Outcome: Adequate for Discharge 08/29/2021 [phone] by Alferd Apa, RN Outcome: Progressing

## 2021-08-29 NOTE — Progress Notes (Signed)
  Subjective: 1 Day Post-Op Procedure(s) (LRB): COMPUTER ASSISTED TOTAL KNEE ARTHROPLASTY (Left) Husband at bedside.  Patient reports pain as well-controlled.   Patient is well, and has had no acute complaints or problems Plan is to go Home after hospital stay. Negative for chest pain and shortness of breath Fever: no Gastrointestinal: negative for nausea and vomiting.    Objective: Vital signs in last 24 hours: Temp:  [97.6 F (36.4 C)-98.4 F (36.9 C)] 98.4 F (36.9 C) (07/20 0749) Pulse Rate:  [56-72] 59 (07/20 0749) Resp:  [15-18] 16 (07/20 0749) BP: (106-128)/(58-75) 128/61 (07/20 0749) SpO2:  [94 %-97 %] 97 % (07/20 0749)  Intake/Output from previous day:  Intake/Output Summary (Last 24 hours) at 08/29/2021 1213 Last data filed at 08/29/2021 4650 Gross per 24 hour  Intake 2086.76 ml  Output 195 ml  Net 1891.76 ml    Intake/Output this shift: No intake/output data recorded.  Labs: Recent Labs    08/28/21 0713  HGB 11.9*   Recent Labs    08/28/21 0713  HCT 35.0*   Recent Labs    08/28/21 0713  NA 139  K 3.3*  CL 98  BUN 20  CREATININE 0.70  GLUCOSE 170*   No results for input(s): "LABPT", "INR" in the last 72 hours.   EXAM General - Patient is Alert, Appropriate, and Oriented Extremity - Neurovascular intact Dorsiflexion/Plantar flexion intact Compartment soft Dressing/Incision -Postoperative dressing remains in place., Polar Care in place and working. , Hemovac in place. , Following removal of post-op dressing, mild bloody drainage noted on dressing Motor Function - intact, moving foot and toes well on exam. Able to perform independent SLR.  Cardiovascular- Regular rate and rhythm, no murmurs/rubs/gallops Respiratory- Lungs clear to auscultation bilaterally Gastrointestinal- soft, nontender, and active bowel sounds   Assessment/Plan: 1 Day Post-Op Procedure(s) (LRB): COMPUTER ASSISTED TOTAL KNEE ARTHROPLASTY (Left) Principal Problem:    Total knee replacement status  Estimated body mass index is 34.04 kg/m as calculated from the following:   Height as of this encounter: '5\' 10"'$  (1.778 m).   Weight as of this encounter: 107.6 kg. Advance diet Up with therapy  Anticipate d/c this PM Pending completion of therapy goals.   Post-op dressing removed. , Hemovac removed., Mini compression dressing applied. , and Fresh honeycomb dressing applied.   DVT Prophylaxis - Lovenox, Ted hose, and SCDs Weight-Bearing as tolerated to left leg  Cassell Smiles, PA-C [facility] Orthopaedic Surgery 08/29/2021, 12:13 PM

## 2021-08-29 NOTE — Discharge Summary (Cosign Needed Addendum)
Physician Discharge Summary  Patient ID: Michelle Montgomery MRN: [id number] DOB/AGE: 05-25-1950 71 y.o.  Admit date: 08/28/2021 Discharge date: 08/29/2021  Admission Diagnoses:  Total knee replacement status [Z96.659]  Surgeries:Procedure(s):  Left total knee arthroplasty using computer-assisted navigation   SURGEON:  Marciano Sequin. M.D.   ASSISTANT: Cassell Smiles, PA-C (present and scrubbed throughout the case, critical for assistance with exposure, retraction, instrumentation, and closure)   ANESTHESIA: spinal   ESTIMATED BLOOD LOSS: 50 mL   FLUIDS REPLACED: 800 mL of crystalloid   TOURNIQUET TIME: 83 minutes   DRAINS: 2 medium Hemovac drains   SOFT TISSUE RELEASES: Anterior cruciate ligament, posterior cruciate ligament, deep medial collateral ligament, patellofemoral ligament   IMPLANTS UTILIZED: DePuy Attune size 6 posterior stabilized femoral component (cemented), size 6 rotating platform tibial component (cemented), 41 mm medialized dome patella (cemented), and an 8 mm stabilized rotating platform polyethylene insert.  Discharge Diagnoses: Patient Active Problem List   Diagnosis Date Noted   Osteopenia after menopause 06/18/2021   Total knee replacement status 04/05/2021   Primary osteoarthritis of left knee 03/11/2021   Varicose veins of both lower extremities 06/12/2016   BPV (benign positional vertigo), right 08/24/2015   Smell disturbance 01/16/2015   Osteoarthritis 11/03/2014   Cramps of lower extremity 11/03/2014   Neuralgia neuritis, sciatic nerve 11/03/2014   Insomnia, persistent 11/03/2014   Dyslipidemia 11/03/2014   Type 2 diabetes with nephropathy ([place]) 11/03/2014   Essential (primary) hypertension 11/03/2014   History of colon polyps 11/03/2014   H/O malignant neoplasm of skin 11/03/2014   Microalbuminuria 11/03/2014   Plantar fasciitis 11/03/2014   Gastro-esophageal reflux disease without esophagitis 11/03/2014   Bundle branch block, right  11/03/2014    Past Medical History:  Diagnosis Date   Arthritis    Arthrosis    Bilateral leg cramps    Bilateral sciatica    Cancer (HCC)    basil, [place]a   Chronic insomnia    Diabetes mellitus without complication (HCC)    Dyslipidemia    Family history of adverse reaction to anesthesia    mom had PONV   GERD (gastroesophageal reflux disease)    History of colon polyps    History of skin cancer    Dr. Ave Filter, history of basal cell and squamous cells carcinoma   Hypertension    Microalbuminuria    100-DM   Obesity    Plantar fasciitis, left    Dr. Milinda Pointer   Pneumonia    PONV (postoperative nausea and vomiting)    Reflux    Right bundle branch block      Transfusion:    Consultants (if any):   Discharged Condition: Improved  Hospital Course: Michelle Montgomery is an 71 y.o. female who was admitted 08/28/2021 with a diagnosis of left knee osteoarthritis and went to the operating room on 08/28/2021 and underwent left total knee arthoplasty. The patient received perioperative antibiotics for prophylaxis (see below). The patient tolerated the procedure well and was transported to PACU in stable condition. After meeting PACU criteria, the patient was subsequently transferred to the Orthopaedics/Rehabilitation unit.   The patient received DVT prophylaxis in the form of early mobilization, Lovenox, TED hose, and SCDs . A sacral pad had been placed and heels were elevated off of the bed with rolled towels in order to protect skin integrity. Foley catheter was discontinued on postoperative day #0. Wound drains were discontinued on postoperative day #1. The surgical incision was healing well without signs of infection.  Physical therapy was initiated postoperatively for transfers, gait training, and strengthening. Occupational therapy was initiated for activities of daily living and evaluation for assisted devices. Rehabilitation goals were reviewed in detail with the patient.  The patient made steady progress with physical therapy and physical therapy recommended discharge to Home.   The patient achieved the preliminary goals of this hospitalization and was felt to be medically and orthopaedically appropriate for discharge.  She was given perioperative antibiotics:  Anti-infectives (From admission, onward)    Start     Dose/Rate Route Frequency Ordered Stop   08/28/21 1430  ceFAZolin (ANCEF) IVPB 2g/100 mL premix        2 g 200 mL/hr over 30 Minutes Intravenous Every 6 hours 08/28/21 1258 08/28/21 2100   08/28/21 0700  ceFAZolin (ANCEF) IVPB 2g/100 mL premix        2 g 200 mL/hr over 30 Minutes Intravenous On call to O.R. 08/28/21 2694 08/28/21 0845   08/28/21 0624  ceFAZolin (ANCEF) 2-4 GM/100ML-% IVPB       Note to Pharmacy: [name] J: cabinet override      08/28/21 0624 08/28/21 0836     .  Recent vital signs:  Vitals:   08/29/21 0332 08/29/21 0749  BP: 110/65 128/61  Pulse: (!) 56 (!) 59  Resp: 18 16  Temp: 98.4 F (36.9 C) 98.4 F (36.9 C)  SpO2: 94% 97%    Recent laboratory studies:  Recent Labs    08/28/21 0713  HGB 11.9*  HCT 35.0*  K 3.3*  CL 98  BUN 20  CREATININE 0.70  GLUCOSE 170*    Diagnostic Studies: DG Knee Left Port  Result Date: 08/28/2021 CLINICAL DATA:  Postop knee replacement EXAM: PORTABLE LEFT KNEE - 1-2 VIEW COMPARISON:  None Available. FINDINGS: Postsurgical changes reflecting left knee arthroplasty are noted without evidence of complication. Hardware alignment is within expected limits. There is expected surrounding postoperative soft tissue swelling. IMPRESSION: Status post left knee arthroplasty without evidence of complication. Electronically Signed   By: Valetta Mole M.D.   On: 08/28/2021 12:35   DG Outside Films Extremity  Result Date: 08/28/2021 This examination belongs to an outside facility and is stored here for comparison purposes only.  Contact the originating outside institution for any  associated report or interpretation.  MM 3D SCREEN BREAST BILATERAL  Result Date: 08/14/2021 CLINICAL DATA:  Screening. EXAM: DIGITAL SCREENING BILATERAL MAMMOGRAM WITH TOMOSYNTHESIS AND CAD TECHNIQUE: Bilateral screening digital craniocaudal and mediolateral oblique mammograms were obtained. Bilateral screening digital breast tomosynthesis was performed. The images were evaluated with computer-aided detection. COMPARISON:  Previous exam(s). ACR Breast Density Category c: The breast tissue is heterogeneously dense, which may obscure small masses. FINDINGS: There are no findings suspicious for malignancy. IMPRESSION: No mammographic evidence of malignancy. A result letter of this screening mammogram will be mailed directly to the patient. RECOMMENDATION: Screening mammogram in one year. (Code:SM-B-01Y) BI-RADS CATEGORY  1: Negative. Electronically Signed   By: Kristopher Oppenheim M.D.   On: 08/14/2021 11:07   DG Bone Density  Result Date: 08/12/2021 EXAM: DUAL X-RAY ABSORPTIOMETRY (DXA) FOR BONE MINERAL DENSITY IMPRESSION: Dear Dr. Ancil Boozer, Your patient APPHIA CROPLEY completed a FRAX assessment on 08/12/2021 using the [place] (analysis version: 14.10) manufactured by [employer]. The following summarizes the results of our evaluation. PATIENT BIOGRAPHICAL: Name: Lowana, Hable Patient ID: [id number] Birth Date: 29-Jun-1950 Height:    70.0 in. Gender:     Female    [age]       Weight:    237.6 lbs. Ethnicity:  White                            Exam Date: 08/12/2021 FRAX* RESULTS:  (version: 3.5) 10-year Probability of Fracture1 Major Osteoporotic Fracture2 Hip Fracture 9.7% 1.5% Population: [place] (Caucasian) Risk Factors: None Based on Femur (Left) Neck BMD 1 -The 10-year probability of fracture may be lower than reported if the patient has received treatment. 2 -Major Osteoporotic Fracture: Clinical Spine, Forearm, Hip or Shoulder *FRAX is a Materials engineer of the [employer] for Metabolic Bone Disease, a Rankin (WHO) [facility]. ASSESSMENT: The probability of a major osteoporotic fracture is 9.7% within the next ten years. The probability of a hip fracture is 1.5% within the next ten years. . Your patient Atalia Litzinger completed a BMD test on 08/12/2021 using the [place] (software version: 14.10) manufactured by [employer]. The following summarizes the results of our evaluation. Technologist: [facility] PATIENT BIOGRAPHICAL: Name: Callaway, Hardigree Patient ID: [id number] Birth Date: [birth year] Height: 70.0 in. Gender: Female Exam Date: 08/12/2021 Weight: 237.6 lbs. Indications: Caucasian, Diabetic, Height Loss, Postmenopausal, Right knee replacement Fractures: Treatments: Calcium, Glipizide, Metformin, Vitamin D DENSITOMETRY RESULTS: Site      Region     Measured Date Measured Age WHO Classification Young Adult T-score BMD         %Change vs. Previous Significant Change (*) AP Spine L1-L4 08/12/2021 72.6 Osteopenia -1.3 1.023 g/cm2 -0.4% - AP Spine L1-L4 09/15/2017 68.7 Osteopenia -1.3 1.027 g/cm2 -1.7% - AP Spine L1-L4 03/02/2014 65.2 Osteopenia -1.1 1.045 g/cm2 -0.6% - AP Spine L1-L4 11/21/2008 59.9 Osteopenia -1.2 1.051 g/cm2 - - DualFemur Neck Left 08/12/2021 72.6 Osteopenia -1.4 0.844 g/cm2 1.4% - DualFemur Neck Left 09/15/2017 68.7 Osteopenia -1.5 0.832 g/cm2 3.7% - DualFemur Neck Left 03/02/2014 65.2 Osteopenia -1.7 0.802 g/cm2 -6.4% Yes DualFemur Neck Left 11/21/2008 59.9 Osteopenia -1.3 0.857 g/cm2 - - DualFemur Total Mean 08/12/2021 72.6 Normal -0.8 0.910 g/cm2 -0.5% - DualFemur Total Mean 09/15/2017 68.7 Normal -0.7 0.915 g/cm2 -2.5% Yes DualFemur Total Mean 03/02/2014 65.2 Normal -0.6 0.938 g/cm2 -6.9% Yes DualFemur Total Mean 11/21/2008 59.9 Normal 0.0 1.007 g/cm2 - - ASSESSMENT: The BMD measured at Femur Neck Left is 0.844 g/cm2 with a T-score of -1.4. This patient is considered osteopenic  according to [facility]) criteria. The scan quality is good. Compared with prior study, there has been no significant change in the spine. Compared with prior study, there has been no significant change in the total hip. World Pharmacologist [facility]) criteria for post-menopausal, Caucasian Women: Normal:                   T-score at or above -1 SD Osteopenia/low bone mass: T-score between -1 and -2.5 SD Osteoporosis:             T-score at or below -2.5 SD RECOMMENDATIONS: 1. All patients should optimize calcium and vitamin D intake. 2. Consider FDA-approved medical therapies in postmenopausal women and men aged 48 years and older, based on the following: a. A hip or vertebral(clinical or morphometric) fracture b. T-score < -2.5 at the femoral neck or spine after appropriate evaluation to exclude secondary causes c. Low bone mass (T-score between -1.0 and -2.5 at the femoral neck or spine) and a 10-year probability of a hip fracture > 3%  or a 10-year probability of a major osteoporosis-related fracture > 20% based on the [place]-adapted WHO algorithm 3. Clinician judgment and/or patient preferences may indicate treatment for people with 10-year fracture probabilities above or below these levels FOLLOW-UP: People with diagnosed cases of osteoporosis or at high risk for fracture should have regular bone mineral density tests. For patients eligible for Medicare, routine testing is allowed once every 2 years. The testing frequency can be increased to one year for patients who have rapidly progressing disease, those who are receiving or discontinuing medical therapy to restore bone mass, or have additional risk factors. I have reviewed this report, and agree with the above findings. [facility] Electronically Signed   By: Zerita Boers M.D.   On: 08/12/2021 11:38    Discharge Medications:   Allergies as of 08/29/2021       Reactions   Codeine Nausea Only   Passed out   Celecoxib  Hives   Penicillins Rash   IgE = 17 (WNL) on 04/02/2021        Medication List     STOP taking these medications    aspirin EC 81 MG tablet   Coenzyme Q10 300 MG Caps   [employer] Its Time To Sleep Size 3 Misc   Deep Blue Relief Gel   diclofenac Sodium 1 % Gel Commonly known as: VOLTAREN   Fish Oil Concentrate 1000 MG Caps   Glucosamine Chondroitin Triple Tabs   Melatonin 5 MG Chew   MULTIVITAMIN ADULT PO   OVER THE COUNTER MEDICATION   OVER THE COUNTER MEDICATION   UNABLE TO FIND   vitamin C with rose hips 1000 MG tablet   Vitamin D 50 MCG (2000 UT) Caps   vitamin E 180 MG (400 UNITS) capsule       TAKE these medications    acetaminophen 500 MG tablet Commonly known as: TYLENOL Take 500 mg by mouth every 6 (six) hours as needed.   amLODipine 5 MG tablet Commonly known as: NORVASC Take 1 tablet (5 mg total) by mouth daily.   atenolol-chlorthalidone 50-25 MG tablet Commonly known as: TENORETIC Take 1 tablet by mouth daily.   atorvastatin 40 MG tablet Commonly known as: LIPITOR Take 1 tablet (40 mg total) by mouth daily.   benazepril 40 MG tablet Commonly known as: LOTENSIN Take 1 tablet (40 mg total) by mouth daily.   blood glucose meter kit and supplies Dispense based on patient and insurance preference. Use up to four times daily as directed. (FOR ICD-10 E10.9, E11.9).   CALCIUM 600 + D PO Take 1 tablet by mouth daily.   cetirizine 10 MG tablet Commonly known as: ZYRTEC Take 10 mg by mouth daily as needed for allergies.   diphenhydramine-acetaminophen 25-500 MG Tabs tablet Commonly known as: TYLENOL PM Take 1 tablet by mouth at bedtime as needed.   enoxaparin 40 MG/0.4ML injection Commonly known as: LOVENOX Inject 0.4 mLs (40 mg total) into the skin daily for 14 days.   FIBER-CAPS PO Take 1 capsule by mouth daily.   glipiZIDE 2.5 MG 24 hr tablet Commonly known as: GLUCOTROL XL Take 1 tablet (2.5 mg total) by mouth daily with  breakfast.   metFORMIN 750 MG 24 hr tablet Commonly known as: GLUCOPHAGE-XR Take 1 tablet (750 mg total) by mouth 2 (two) times daily.   omeprazole 20 MG capsule Commonly known as: PRILOSEC Take 1 capsule (20 mg total) by mouth daily.   OneTouch Verio test strip Generic drug: glucose blood  1 each by Other route in the morning and at bedtime. Use as instructed   oxyCODONE 5 MG immediate release tablet Commonly known as: Oxy IR/ROXICODONE Take 1 tablet (5 mg total) by mouth every 4 (four) hours as needed for severe pain.   pioglitazone 30 MG tablet Commonly known as: ACTOS Take 1 tablet (30 mg total) by mouth daily.   psyllium 0.52 g capsule Commonly known as: REGULOID Take 0.52 g by mouth daily.   traMADol 50 MG tablet Commonly known as: ULTRAM Take 1 tablet (50 mg total) by mouth every 4 (four) hours as needed for moderate pain.               Durable Medical Equipment  (From admission, onward)           Start     Ordered   08/28/21 1259  DME Walker rolling  Once       Question:  Patient needs a walker to treat with the following condition  Answer:  Total knee replacement status   08/28/21 1258   08/28/21 1259  DME Bedside commode  Once       Question:  Patient needs a bedside commode to treat with the following condition  Answer:  Total knee replacement status   08/28/21 1258            Disposition: Home with home health PT     Follow-up Information     Fausto Skillern, PA-C Follow up on 09/12/2021.   Specialty: Orthopedic Surgery Why: at 1:15pm Contact information: [place] [address] [phone]         Dereck Leep, MD Follow up on 10/10/2021.   Specialty: Orthopedic Surgery Why: at 9:30am Contact information: [place] [address] [place], PA-C 08/29/2021, 1:02  PM

## 2021-08-29 NOTE — Progress Notes (Signed)
Spoke with the patient in the room  The patient lives at Home with her husband The patient  currently has DME including a rolling walker and a 3 in 1 The patient will not  need additional DME They have transportation with her husband They can afford their medication  They are set up with [facility] for Home health services

## 2021-08-29 NOTE — Plan of Care (Signed)

## 2021-08-29 NOTE — Progress Notes (Signed)
Physical Therapy Treatment Patient Details Name: Michelle Montgomery MRN: [id number] DOB: 04-16-1950 Today's Date: 08/29/2021   History of Present Illness Pt is 71 y.o. female s/p L TKA.  Pt has PMH including: arthritis, arthrosis, B leg cramps, B sciatica, basil saronoma, GERD, HTN, obesity, R TKA.    PT Comments    Pt had just finished OT upon author arriving. She is endorsing feeling "great" and is eager to DC home. Pt was safely able to exit bed, stand, and ambulate with RW. She was also able to demonstrate correct performance of stairs and tolerate ROM exercises. AROM 4-89 degrees. Did educate pt on importance of continued ROM and strengthening. Pt is able to independently perform HEP. Overall pt is doing extremely well. Recommend DC home with HHPT to follow. Will benefit from continued skilled PT to address deficits with ROM, strength, and balance while being assisted to PLOF.    Recommendations for follow up therapy are one component of a multi-disciplinary discharge planning process, led by the attending physician.  Recommendations may be updated based on patient status, additional functional criteria and insurance authorization.  Follow Up Recommendations  Home health PT     Assistance Recommended at Discharge PRN  Patient can return home with the following A little help with walking and/or transfers;A little help with bathing/dressing/bathroom;Assist for transportation;Help with stairs or ramp for entrance   Equipment Recommendations  None recommended by PT       Precautions / Restrictions Precautions Precautions: Knee Precaution Booklet Issued: No Restrictions Weight Bearing Restrictions: Yes LLE Weight Bearing: Weight bearing as tolerated     Mobility  Bed Mobility Overal bed mobility: Modified Independent    Transfers Overall transfer level: Needs assistance Equipment used: Rolling walker (2 wheels) Transfers: Sit to/from Stand Sit to Stand: Supervision, From  elevated surface    Ambulation/Gait Ambulation/Gait assistance: Supervision Gait Distance (Feet): 200 Feet Assistive device: Rolling walker (2 wheels) Gait Pattern/deviations: WFL(Within Functional Limits), Step-through pattern, Decreased step length - right, Decreased stance time - left Gait velocity: WNL     General Gait Details: no LOB or safety concern with ambulation   Stairs Stairs: Yes Stairs assistance: Supervision Stair Management: No rails, Forwards, Backwards, With walker Number of Stairs: 1 General stair comments: pt demonstrated safe ability to ascend/descend step to simulate home entry     Balance Overall balance assessment: Needs assistance Sitting-balance support: No upper extremity supported, Feet supported Sitting balance-Leahy Scale: Good     Standing balance support: Bilateral upper extremity supported, During functional activity Standing balance-Leahy Scale: Good       Cognition Arousal/Alertness: Awake/alert Behavior During Therapy: WFL for tasks assessed/performed Overall Cognitive Status: Within Functional Limits for tasks assessed    General Comments: Pt is A and O x 4        Exercises Total Joint Exercises Goniometric ROM: 4-89 AROM    General Comments General comments (skin integrity, edema, etc.): Chief Strategy Officer issued HEP and pt demonstrated ability to I'ly perform      Pertinent Vitals/Pain Pain Assessment Pain Assessment: 0-10 Pain Score: 2  Faces Pain Scale: Hurts a little bit Pain Location: L Knee at surgical site. Pain Descriptors / Indicators: Aching Pain Intervention(s): Limited activity within patient's tolerance, Monitored during session, Premedicated before session, Repositioned, Ice applied     PT Goals (current goals can now be found in the care plan section) Acute Rehab PT Goals Patient Stated Goal: home Progress towards PT goals: Progressing toward goals    Frequency  BID      PT Plan Current plan remains  appropriate       AM-PAC PT "6 Clicks" Mobility   Outcome Measure  Help needed turning from your back to your side while in a flat bed without using bedrails?: A Little Help needed moving from lying on your back to sitting on the side of a flat bed without using bedrails?: A Little Help needed moving to and from a bed to a chair (including a wheelchair)?: A Little Help needed standing up from a chair using your arms (e.g., wheelchair or bedside chair)?: A Little Help needed to walk in hospital room?: A Little Help needed climbing 3-5 steps with a railing? : A Little 6 Click Score: 18    End of Session   Activity Tolerance: Patient tolerated treatment well Patient left: in bed;with call bell/phone within reach;with bed alarm set;with family/visitor present Nurse Communication: Mobility status PT Visit Diagnosis: Other abnormalities of gait and mobility (R26.89);Muscle weakness (generalized) (M62.81);Difficulty in walking, not elsewhere classified (R26.2);Pain Pain - Right/Left: Left Pain - part of body: Knee     Time: 0926-0950 PT Time Calculation (min) (ACUTE ONLY): 24 min  Charges:  $Gait Training: 8-22 mins $Therapeutic Exercise: 8-22 mins                     Julaine Fusi PTA 08/29/21, 10:00 AM

## 2021-08-30 DIAGNOSIS — E1151 Type 2 diabetes mellitus with diabetic peripheral angiopathy without gangrene: Secondary | ICD-10-CM | POA: Diagnosis not present

## 2021-08-30 DIAGNOSIS — M8580 Other specified disorders of bone density and structure, unspecified site: Secondary | ICD-10-CM | POA: Diagnosis not present

## 2021-08-30 DIAGNOSIS — I4510 Unspecified right bundle-branch block: Secondary | ICD-10-CM | POA: Diagnosis not present

## 2021-08-30 DIAGNOSIS — Z96653 Presence of artificial knee joint, bilateral: Secondary | ICD-10-CM | POA: Diagnosis not present

## 2021-08-30 DIAGNOSIS — I8393 Asymptomatic varicose veins of bilateral lower extremities: Secondary | ICD-10-CM | POA: Diagnosis not present

## 2021-08-30 DIAGNOSIS — E1121 Type 2 diabetes mellitus with diabetic nephropathy: Secondary | ICD-10-CM | POA: Diagnosis not present

## 2021-08-30 DIAGNOSIS — E785 Hyperlipidemia, unspecified: Secondary | ICD-10-CM | POA: Diagnosis not present

## 2021-08-30 DIAGNOSIS — Z85828 Personal history of other malignant neoplasm of skin: Secondary | ICD-10-CM | POA: Diagnosis not present

## 2021-08-30 DIAGNOSIS — E669 Obesity, unspecified: Secondary | ICD-10-CM | POA: Diagnosis not present

## 2021-08-30 DIAGNOSIS — M792 Neuralgia and neuritis, unspecified: Secondary | ICD-10-CM | POA: Diagnosis not present

## 2021-08-30 DIAGNOSIS — H8111 Benign paroxysmal vertigo, right ear: Secondary | ICD-10-CM | POA: Diagnosis not present

## 2021-08-30 DIAGNOSIS — Z4789 Encounter for other orthopedic aftercare: Secondary | ICD-10-CM | POA: Diagnosis not present

## 2021-08-30 DIAGNOSIS — Z7982 Long term (current) use of aspirin: Secondary | ICD-10-CM | POA: Diagnosis not present

## 2021-08-30 DIAGNOSIS — Z7984 Long term (current) use of oral hypoglycemic drugs: Secondary | ICD-10-CM | POA: Diagnosis not present

## 2021-08-30 DIAGNOSIS — Z471 Aftercare following joint replacement surgery: Secondary | ICD-10-CM | POA: Diagnosis not present

## 2021-08-30 DIAGNOSIS — M722 Plantar fascial fibromatosis: Secondary | ICD-10-CM | POA: Diagnosis not present

## 2021-08-30 DIAGNOSIS — I10 Essential (primary) hypertension: Secondary | ICD-10-CM | POA: Diagnosis not present

## 2021-08-30 DIAGNOSIS — Z86010 Personal history of colonic polyps: Secondary | ICD-10-CM | POA: Diagnosis not present

## 2021-08-30 DIAGNOSIS — Z87891 Personal history of nicotine dependence: Secondary | ICD-10-CM | POA: Diagnosis not present

## 2021-08-30 DIAGNOSIS — Z6834 Body mass index (BMI) 34.0-34.9, adult: Secondary | ICD-10-CM | POA: Diagnosis not present

## 2021-08-30 DIAGNOSIS — F5104 Psychophysiologic insomnia: Secondary | ICD-10-CM | POA: Diagnosis not present

## 2021-08-30 DIAGNOSIS — M5432 Sciatica, left side: Secondary | ICD-10-CM | POA: Diagnosis not present

## 2021-08-30 DIAGNOSIS — K219 Gastro-esophageal reflux disease without esophagitis: Secondary | ICD-10-CM | POA: Diagnosis not present

## 2021-08-30 DIAGNOSIS — M5431 Sciatica, right side: Secondary | ICD-10-CM | POA: Diagnosis not present

## 2021-09-01 DIAGNOSIS — Z471 Aftercare following joint replacement surgery: Secondary | ICD-10-CM | POA: Diagnosis not present

## 2021-09-01 DIAGNOSIS — K219 Gastro-esophageal reflux disease without esophagitis: Secondary | ICD-10-CM | POA: Diagnosis not present

## 2021-09-01 DIAGNOSIS — E1121 Type 2 diabetes mellitus with diabetic nephropathy: Secondary | ICD-10-CM | POA: Diagnosis not present

## 2021-09-01 DIAGNOSIS — F5104 Psychophysiologic insomnia: Secondary | ICD-10-CM | POA: Diagnosis not present

## 2021-09-01 DIAGNOSIS — Z6834 Body mass index (BMI) 34.0-34.9, adult: Secondary | ICD-10-CM | POA: Diagnosis not present

## 2021-09-01 DIAGNOSIS — Z96653 Presence of artificial knee joint, bilateral: Secondary | ICD-10-CM | POA: Diagnosis not present

## 2021-09-01 DIAGNOSIS — Z85828 Personal history of other malignant neoplasm of skin: Secondary | ICD-10-CM | POA: Diagnosis not present

## 2021-09-01 DIAGNOSIS — E669 Obesity, unspecified: Secondary | ICD-10-CM | POA: Diagnosis not present

## 2021-09-01 DIAGNOSIS — H8111 Benign paroxysmal vertigo, right ear: Secondary | ICD-10-CM | POA: Diagnosis not present

## 2021-09-01 DIAGNOSIS — Z7984 Long term (current) use of oral hypoglycemic drugs: Secondary | ICD-10-CM | POA: Diagnosis not present

## 2021-09-01 DIAGNOSIS — E1151 Type 2 diabetes mellitus with diabetic peripheral angiopathy without gangrene: Secondary | ICD-10-CM | POA: Diagnosis not present

## 2021-09-01 DIAGNOSIS — M5431 Sciatica, right side: Secondary | ICD-10-CM | POA: Diagnosis not present

## 2021-09-01 DIAGNOSIS — M5432 Sciatica, left side: Secondary | ICD-10-CM | POA: Diagnosis not present

## 2021-09-01 DIAGNOSIS — E785 Hyperlipidemia, unspecified: Secondary | ICD-10-CM | POA: Diagnosis not present

## 2021-09-01 DIAGNOSIS — I8393 Asymptomatic varicose veins of bilateral lower extremities: Secondary | ICD-10-CM | POA: Diagnosis not present

## 2021-09-01 DIAGNOSIS — M722 Plantar fascial fibromatosis: Secondary | ICD-10-CM | POA: Diagnosis not present

## 2021-09-01 DIAGNOSIS — Z87891 Personal history of nicotine dependence: Secondary | ICD-10-CM | POA: Diagnosis not present

## 2021-09-01 DIAGNOSIS — Z7982 Long term (current) use of aspirin: Secondary | ICD-10-CM | POA: Diagnosis not present

## 2021-09-01 DIAGNOSIS — M792 Neuralgia and neuritis, unspecified: Secondary | ICD-10-CM | POA: Diagnosis not present

## 2021-09-01 DIAGNOSIS — M8580 Other specified disorders of bone density and structure, unspecified site: Secondary | ICD-10-CM | POA: Diagnosis not present

## 2021-09-01 DIAGNOSIS — I4510 Unspecified right bundle-branch block: Secondary | ICD-10-CM | POA: Diagnosis not present

## 2021-09-01 DIAGNOSIS — I10 Essential (primary) hypertension: Secondary | ICD-10-CM | POA: Diagnosis not present

## 2021-09-01 DIAGNOSIS — Z86010 Personal history of colonic polyps: Secondary | ICD-10-CM | POA: Diagnosis not present

## 2021-09-03 DIAGNOSIS — Z85828 Personal history of other malignant neoplasm of skin: Secondary | ICD-10-CM | POA: Diagnosis not present

## 2021-09-03 DIAGNOSIS — Z86010 Personal history of colonic polyps: Secondary | ICD-10-CM | POA: Diagnosis not present

## 2021-09-03 DIAGNOSIS — F5104 Psychophysiologic insomnia: Secondary | ICD-10-CM | POA: Diagnosis not present

## 2021-09-03 DIAGNOSIS — E1151 Type 2 diabetes mellitus with diabetic peripheral angiopathy without gangrene: Secondary | ICD-10-CM | POA: Diagnosis not present

## 2021-09-03 DIAGNOSIS — M8580 Other specified disorders of bone density and structure, unspecified site: Secondary | ICD-10-CM | POA: Diagnosis not present

## 2021-09-03 DIAGNOSIS — I4510 Unspecified right bundle-branch block: Secondary | ICD-10-CM | POA: Diagnosis not present

## 2021-09-03 DIAGNOSIS — Z96653 Presence of artificial knee joint, bilateral: Secondary | ICD-10-CM | POA: Diagnosis not present

## 2021-09-03 DIAGNOSIS — E785 Hyperlipidemia, unspecified: Secondary | ICD-10-CM | POA: Diagnosis not present

## 2021-09-03 DIAGNOSIS — M5431 Sciatica, right side: Secondary | ICD-10-CM | POA: Diagnosis not present

## 2021-09-03 DIAGNOSIS — H8111 Benign paroxysmal vertigo, right ear: Secondary | ICD-10-CM | POA: Diagnosis not present

## 2021-09-03 DIAGNOSIS — Z6834 Body mass index (BMI) 34.0-34.9, adult: Secondary | ICD-10-CM | POA: Diagnosis not present

## 2021-09-03 DIAGNOSIS — E669 Obesity, unspecified: Secondary | ICD-10-CM | POA: Diagnosis not present

## 2021-09-03 DIAGNOSIS — Z7984 Long term (current) use of oral hypoglycemic drugs: Secondary | ICD-10-CM | POA: Diagnosis not present

## 2021-09-03 DIAGNOSIS — E1121 Type 2 diabetes mellitus with diabetic nephropathy: Secondary | ICD-10-CM | POA: Diagnosis not present

## 2021-09-03 DIAGNOSIS — M5432 Sciatica, left side: Secondary | ICD-10-CM | POA: Diagnosis not present

## 2021-09-03 DIAGNOSIS — M722 Plantar fascial fibromatosis: Secondary | ICD-10-CM | POA: Diagnosis not present

## 2021-09-03 DIAGNOSIS — Z87891 Personal history of nicotine dependence: Secondary | ICD-10-CM | POA: Diagnosis not present

## 2021-09-03 DIAGNOSIS — Z471 Aftercare following joint replacement surgery: Secondary | ICD-10-CM | POA: Diagnosis not present

## 2021-09-03 DIAGNOSIS — Z7982 Long term (current) use of aspirin: Secondary | ICD-10-CM | POA: Diagnosis not present

## 2021-09-03 DIAGNOSIS — K219 Gastro-esophageal reflux disease without esophagitis: Secondary | ICD-10-CM | POA: Diagnosis not present

## 2021-09-03 DIAGNOSIS — I8393 Asymptomatic varicose veins of bilateral lower extremities: Secondary | ICD-10-CM | POA: Diagnosis not present

## 2021-09-03 DIAGNOSIS — M792 Neuralgia and neuritis, unspecified: Secondary | ICD-10-CM | POA: Diagnosis not present

## 2021-09-03 DIAGNOSIS — I10 Essential (primary) hypertension: Secondary | ICD-10-CM | POA: Diagnosis not present

## 2021-09-05 DIAGNOSIS — F5104 Psychophysiologic insomnia: Secondary | ICD-10-CM | POA: Diagnosis not present

## 2021-09-05 DIAGNOSIS — Z7984 Long term (current) use of oral hypoglycemic drugs: Secondary | ICD-10-CM | POA: Diagnosis not present

## 2021-09-05 DIAGNOSIS — M5431 Sciatica, right side: Secondary | ICD-10-CM | POA: Diagnosis not present

## 2021-09-05 DIAGNOSIS — H8111 Benign paroxysmal vertigo, right ear: Secondary | ICD-10-CM | POA: Diagnosis not present

## 2021-09-05 DIAGNOSIS — M792 Neuralgia and neuritis, unspecified: Secondary | ICD-10-CM | POA: Diagnosis not present

## 2021-09-05 DIAGNOSIS — Z6834 Body mass index (BMI) 34.0-34.9, adult: Secondary | ICD-10-CM | POA: Diagnosis not present

## 2021-09-05 DIAGNOSIS — K219 Gastro-esophageal reflux disease without esophagitis: Secondary | ICD-10-CM | POA: Diagnosis not present

## 2021-09-05 DIAGNOSIS — Z96653 Presence of artificial knee joint, bilateral: Secondary | ICD-10-CM | POA: Diagnosis not present

## 2021-09-05 DIAGNOSIS — Z87891 Personal history of nicotine dependence: Secondary | ICD-10-CM | POA: Diagnosis not present

## 2021-09-05 DIAGNOSIS — Z85828 Personal history of other malignant neoplasm of skin: Secondary | ICD-10-CM | POA: Diagnosis not present

## 2021-09-05 DIAGNOSIS — M8580 Other specified disorders of bone density and structure, unspecified site: Secondary | ICD-10-CM | POA: Diagnosis not present

## 2021-09-05 DIAGNOSIS — E669 Obesity, unspecified: Secondary | ICD-10-CM | POA: Diagnosis not present

## 2021-09-05 DIAGNOSIS — M722 Plantar fascial fibromatosis: Secondary | ICD-10-CM | POA: Diagnosis not present

## 2021-09-05 DIAGNOSIS — Z471 Aftercare following joint replacement surgery: Secondary | ICD-10-CM | POA: Diagnosis not present

## 2021-09-05 DIAGNOSIS — I8393 Asymptomatic varicose veins of bilateral lower extremities: Secondary | ICD-10-CM | POA: Diagnosis not present

## 2021-09-05 DIAGNOSIS — E1151 Type 2 diabetes mellitus with diabetic peripheral angiopathy without gangrene: Secondary | ICD-10-CM | POA: Diagnosis not present

## 2021-09-05 DIAGNOSIS — I10 Essential (primary) hypertension: Secondary | ICD-10-CM | POA: Diagnosis not present

## 2021-09-05 DIAGNOSIS — E1121 Type 2 diabetes mellitus with diabetic nephropathy: Secondary | ICD-10-CM | POA: Diagnosis not present

## 2021-09-05 DIAGNOSIS — Z86010 Personal history of colonic polyps: Secondary | ICD-10-CM | POA: Diagnosis not present

## 2021-09-05 DIAGNOSIS — I4510 Unspecified right bundle-branch block: Secondary | ICD-10-CM | POA: Diagnosis not present

## 2021-09-05 DIAGNOSIS — Z7982 Long term (current) use of aspirin: Secondary | ICD-10-CM | POA: Diagnosis not present

## 2021-09-05 DIAGNOSIS — E785 Hyperlipidemia, unspecified: Secondary | ICD-10-CM | POA: Diagnosis not present

## 2021-09-05 DIAGNOSIS — M5432 Sciatica, left side: Secondary | ICD-10-CM | POA: Diagnosis not present

## 2021-09-09 DIAGNOSIS — Z85828 Personal history of other malignant neoplasm of skin: Secondary | ICD-10-CM | POA: Diagnosis not present

## 2021-09-09 DIAGNOSIS — I10 Essential (primary) hypertension: Secondary | ICD-10-CM | POA: Diagnosis not present

## 2021-09-09 DIAGNOSIS — E1151 Type 2 diabetes mellitus with diabetic peripheral angiopathy without gangrene: Secondary | ICD-10-CM | POA: Diagnosis not present

## 2021-09-09 DIAGNOSIS — E669 Obesity, unspecified: Secondary | ICD-10-CM | POA: Diagnosis not present

## 2021-09-09 DIAGNOSIS — M5432 Sciatica, left side: Secondary | ICD-10-CM | POA: Diagnosis not present

## 2021-09-09 DIAGNOSIS — M792 Neuralgia and neuritis, unspecified: Secondary | ICD-10-CM | POA: Diagnosis not present

## 2021-09-09 DIAGNOSIS — Z86010 Personal history of colonic polyps: Secondary | ICD-10-CM | POA: Diagnosis not present

## 2021-09-09 DIAGNOSIS — H8111 Benign paroxysmal vertigo, right ear: Secondary | ICD-10-CM | POA: Diagnosis not present

## 2021-09-09 DIAGNOSIS — Z87891 Personal history of nicotine dependence: Secondary | ICD-10-CM | POA: Diagnosis not present

## 2021-09-09 DIAGNOSIS — M722 Plantar fascial fibromatosis: Secondary | ICD-10-CM | POA: Diagnosis not present

## 2021-09-09 DIAGNOSIS — Z96653 Presence of artificial knee joint, bilateral: Secondary | ICD-10-CM | POA: Diagnosis not present

## 2021-09-09 DIAGNOSIS — M5431 Sciatica, right side: Secondary | ICD-10-CM | POA: Diagnosis not present

## 2021-09-09 DIAGNOSIS — I8393 Asymptomatic varicose veins of bilateral lower extremities: Secondary | ICD-10-CM | POA: Diagnosis not present

## 2021-09-09 DIAGNOSIS — Z7984 Long term (current) use of oral hypoglycemic drugs: Secondary | ICD-10-CM | POA: Diagnosis not present

## 2021-09-09 DIAGNOSIS — M8580 Other specified disorders of bone density and structure, unspecified site: Secondary | ICD-10-CM | POA: Diagnosis not present

## 2021-09-09 DIAGNOSIS — Z471 Aftercare following joint replacement surgery: Secondary | ICD-10-CM | POA: Diagnosis not present

## 2021-09-09 DIAGNOSIS — I4510 Unspecified right bundle-branch block: Secondary | ICD-10-CM | POA: Diagnosis not present

## 2021-09-09 DIAGNOSIS — K219 Gastro-esophageal reflux disease without esophagitis: Secondary | ICD-10-CM | POA: Diagnosis not present

## 2021-09-09 DIAGNOSIS — Z6834 Body mass index (BMI) 34.0-34.9, adult: Secondary | ICD-10-CM | POA: Diagnosis not present

## 2021-09-09 DIAGNOSIS — E1121 Type 2 diabetes mellitus with diabetic nephropathy: Secondary | ICD-10-CM | POA: Diagnosis not present

## 2021-09-09 DIAGNOSIS — F5104 Psychophysiologic insomnia: Secondary | ICD-10-CM | POA: Diagnosis not present

## 2021-09-09 DIAGNOSIS — E785 Hyperlipidemia, unspecified: Secondary | ICD-10-CM | POA: Diagnosis not present

## 2021-09-09 DIAGNOSIS — Z7982 Long term (current) use of aspirin: Secondary | ICD-10-CM | POA: Diagnosis not present

## 2021-09-11 DIAGNOSIS — Z471 Aftercare following joint replacement surgery: Secondary | ICD-10-CM | POA: Diagnosis not present

## 2021-09-11 DIAGNOSIS — Z86010 Personal history of colonic polyps: Secondary | ICD-10-CM | POA: Diagnosis not present

## 2021-09-11 DIAGNOSIS — Z85828 Personal history of other malignant neoplasm of skin: Secondary | ICD-10-CM | POA: Diagnosis not present

## 2021-09-11 DIAGNOSIS — H8111 Benign paroxysmal vertigo, right ear: Secondary | ICD-10-CM | POA: Diagnosis not present

## 2021-09-11 DIAGNOSIS — Z87891 Personal history of nicotine dependence: Secondary | ICD-10-CM | POA: Diagnosis not present

## 2021-09-11 DIAGNOSIS — M8580 Other specified disorders of bone density and structure, unspecified site: Secondary | ICD-10-CM | POA: Diagnosis not present

## 2021-09-11 DIAGNOSIS — E785 Hyperlipidemia, unspecified: Secondary | ICD-10-CM | POA: Diagnosis not present

## 2021-09-11 DIAGNOSIS — M792 Neuralgia and neuritis, unspecified: Secondary | ICD-10-CM | POA: Diagnosis not present

## 2021-09-11 DIAGNOSIS — K219 Gastro-esophageal reflux disease without esophagitis: Secondary | ICD-10-CM | POA: Diagnosis not present

## 2021-09-11 DIAGNOSIS — Z6834 Body mass index (BMI) 34.0-34.9, adult: Secondary | ICD-10-CM | POA: Diagnosis not present

## 2021-09-11 DIAGNOSIS — I4510 Unspecified right bundle-branch block: Secondary | ICD-10-CM | POA: Diagnosis not present

## 2021-09-11 DIAGNOSIS — I10 Essential (primary) hypertension: Secondary | ICD-10-CM | POA: Diagnosis not present

## 2021-09-11 DIAGNOSIS — Z7984 Long term (current) use of oral hypoglycemic drugs: Secondary | ICD-10-CM | POA: Diagnosis not present

## 2021-09-11 DIAGNOSIS — E669 Obesity, unspecified: Secondary | ICD-10-CM | POA: Diagnosis not present

## 2021-09-11 DIAGNOSIS — M722 Plantar fascial fibromatosis: Secondary | ICD-10-CM | POA: Diagnosis not present

## 2021-09-11 DIAGNOSIS — M5432 Sciatica, left side: Secondary | ICD-10-CM | POA: Diagnosis not present

## 2021-09-11 DIAGNOSIS — I8393 Asymptomatic varicose veins of bilateral lower extremities: Secondary | ICD-10-CM | POA: Diagnosis not present

## 2021-09-11 DIAGNOSIS — E1121 Type 2 diabetes mellitus with diabetic nephropathy: Secondary | ICD-10-CM | POA: Diagnosis not present

## 2021-09-11 DIAGNOSIS — M5431 Sciatica, right side: Secondary | ICD-10-CM | POA: Diagnosis not present

## 2021-09-11 DIAGNOSIS — F5104 Psychophysiologic insomnia: Secondary | ICD-10-CM | POA: Diagnosis not present

## 2021-09-11 DIAGNOSIS — E1151 Type 2 diabetes mellitus with diabetic peripheral angiopathy without gangrene: Secondary | ICD-10-CM | POA: Diagnosis not present

## 2021-09-11 DIAGNOSIS — Z96653 Presence of artificial knee joint, bilateral: Secondary | ICD-10-CM | POA: Diagnosis not present

## 2021-09-11 DIAGNOSIS — Z7982 Long term (current) use of aspirin: Secondary | ICD-10-CM | POA: Diagnosis not present

## 2021-09-12 DIAGNOSIS — M25562 Pain in left knee: Secondary | ICD-10-CM | POA: Diagnosis not present

## 2021-09-12 DIAGNOSIS — Z96652 Presence of left artificial knee joint: Secondary | ICD-10-CM | POA: Diagnosis not present

## 2021-09-17 DIAGNOSIS — Z96652 Presence of left artificial knee joint: Secondary | ICD-10-CM | POA: Diagnosis not present

## 2021-09-17 DIAGNOSIS — M25562 Pain in left knee: Secondary | ICD-10-CM | POA: Diagnosis not present

## 2021-09-20 DIAGNOSIS — M25562 Pain in left knee: Secondary | ICD-10-CM | POA: Diagnosis not present

## 2021-09-20 DIAGNOSIS — Z96652 Presence of left artificial knee joint: Secondary | ICD-10-CM | POA: Diagnosis not present

## 2021-09-23 DIAGNOSIS — M25562 Pain in left knee: Secondary | ICD-10-CM | POA: Diagnosis not present

## 2021-09-23 DIAGNOSIS — Z96652 Presence of left artificial knee joint: Secondary | ICD-10-CM | POA: Diagnosis not present

## 2021-09-25 DIAGNOSIS — M25562 Pain in left knee: Secondary | ICD-10-CM | POA: Diagnosis not present

## 2021-09-25 DIAGNOSIS — Z96652 Presence of left artificial knee joint: Secondary | ICD-10-CM | POA: Diagnosis not present

## 2021-09-30 DIAGNOSIS — Z96652 Presence of left artificial knee joint: Secondary | ICD-10-CM | POA: Diagnosis not present

## 2021-09-30 DIAGNOSIS — M25562 Pain in left knee: Secondary | ICD-10-CM | POA: Diagnosis not present

## 2021-10-02 DIAGNOSIS — M25562 Pain in left knee: Secondary | ICD-10-CM | POA: Diagnosis not present

## 2021-10-02 DIAGNOSIS — Z96652 Presence of left artificial knee joint: Secondary | ICD-10-CM | POA: Diagnosis not present

## 2021-10-03 DIAGNOSIS — I8393 Asymptomatic varicose veins of bilateral lower extremities: Secondary | ICD-10-CM | POA: Diagnosis not present

## 2021-10-03 DIAGNOSIS — M5431 Sciatica, right side: Secondary | ICD-10-CM | POA: Diagnosis not present

## 2021-10-03 DIAGNOSIS — E1151 Type 2 diabetes mellitus with diabetic peripheral angiopathy without gangrene: Secondary | ICD-10-CM | POA: Diagnosis not present

## 2021-10-03 DIAGNOSIS — I4510 Unspecified right bundle-branch block: Secondary | ICD-10-CM | POA: Diagnosis not present

## 2021-10-03 DIAGNOSIS — M5432 Sciatica, left side: Secondary | ICD-10-CM | POA: Diagnosis not present

## 2021-10-03 DIAGNOSIS — E1121 Type 2 diabetes mellitus with diabetic nephropathy: Secondary | ICD-10-CM | POA: Diagnosis not present

## 2021-10-03 DIAGNOSIS — I10 Essential (primary) hypertension: Secondary | ICD-10-CM | POA: Diagnosis not present

## 2021-10-03 DIAGNOSIS — M8580 Other specified disorders of bone density and structure, unspecified site: Secondary | ICD-10-CM | POA: Diagnosis not present

## 2021-10-03 DIAGNOSIS — Z471 Aftercare following joint replacement surgery: Secondary | ICD-10-CM | POA: Diagnosis not present

## 2021-10-03 DIAGNOSIS — M792 Neuralgia and neuritis, unspecified: Secondary | ICD-10-CM | POA: Diagnosis not present

## 2021-10-03 DIAGNOSIS — F5104 Psychophysiologic insomnia: Secondary | ICD-10-CM | POA: Diagnosis not present

## 2021-10-03 DIAGNOSIS — M722 Plantar fascial fibromatosis: Secondary | ICD-10-CM | POA: Diagnosis not present

## 2021-10-09 DIAGNOSIS — Z96652 Presence of left artificial knee joint: Secondary | ICD-10-CM | POA: Diagnosis not present

## 2021-10-10 DIAGNOSIS — Z96652 Presence of left artificial knee joint: Secondary | ICD-10-CM | POA: Diagnosis not present

## 2021-10-16 NOTE — Progress Notes (Signed)
Name: Michelle Montgomery   MRN: [id number]    DOB: 03/09/1950   Date:10/17/2021       Progress Note  Subjective  Chief Complaint  Follow Up  HPI  DMII with nephropathy: A1C spiked to  9.3 % April 2021 , she was able to changed her diet, was more compliant with metformin and pioglitazone and low dose Glipizide. A1C was 7.9% down to 6.9 % after that  7.9 % and down to 7.1 % and last visit in July was 7.6 %   She states glucose fasting has been 130-140  She is on statin therapy for dyslipidemia and also on ACE for microalbuminuria, HTN is under control.     HTN: BP is at goal, taking Tenoretic, Norvasc and Benazepril ,  she denies chest pain, palpitation,dizziness or SOB.    Hyperlipidemia: taking Atorvastatin, she denies myalgia. Reviewed last labs, LDL has been at goal down to 46 , she will have labs before next visit    Insomnia: she is now off Ambien, taking melatonin and Tylenol Pm- discussed risk of Tylenol pm , and sleeps well most of the time. She tried Trazodone but did not help. She is back on benadryl at night - discussed risk of falls.    Lack of sense of smell and taste: symptoms started a couple of years ago She still can't smell dirty diapers, skunk smell or her dogs bad breath. She saw ENT and he suggested MRI brain but she decided to hold off  . She states mother, maternal grandmother, maternal aunt and cousin have the same problems Same    GERD: taking Omeprazole, and no heartburn or regurgitation noticed.  She is doing well, she states when she skips medication for 3 days symptoms returns. Unchanged   OA: she has a total knee replacement 04/05/2021 and left side on 07/03, she is starting PT today    Osteopenia: repeat bone density done 08/2021 showed Frax slightly worse at 9.7 T and risk of hip fracture of 1.5 % , continue high dose calcium and vitamin D , plus physical activity   Recent fall: she was sleeping and fell off her bed, woke up on the floor. It happened two nights  ago. She recalls sleeping and waking on the floor. Husband helped her get up, she used the bathroom and went back to bed and slept until the morning. She has bene sleeping at the recliner since. She has a bruise on left side of the face, left ankle is swollen, she can bear weight. She recently had left knee replacement surgery, just went back for 6 weeks check up. Advised to follow up with Ortho . Denies headaches , no problems chewing or difficulty breathing   Patient Active Problem List   Diagnosis Date Noted   Osteopenia after menopause 06/18/2021   Total knee replacement status 04/05/2021   Primary osteoarthritis of left knee 03/11/2021   Varicose veins of both lower extremities 06/12/2016   BPV (benign positional vertigo), right 08/24/2015   Smell disturbance 01/16/2015   Osteoarthritis 11/03/2014   Cramps of lower extremity 11/03/2014   Neuralgia neuritis, sciatic nerve 11/03/2014   Insomnia, persistent 11/03/2014   Dyslipidemia 11/03/2014   Type 2 diabetes with nephropathy ([place]) 11/03/2014   Essential (primary) hypertension 11/03/2014   History of colon polyps 11/03/2014   H/O malignant neoplasm of skin 11/03/2014   Microalbuminuria 11/03/2014   Plantar fasciitis 11/03/2014   Gastro-esophageal reflux disease without esophagitis 11/03/2014   Bundle branch block, right  11/03/2014    Past Surgical History:  Procedure Laterality Date   achiles tendon Left    BREAST BIOPSY Left 2015   benign   FOOT SURGERY Left 08/29/2011   Spur Removal , and Achilles Tendon Tendolysis   KNEE ARTHROPLASTY Right 04/05/2021   Procedure: COMPUTER ASSISTED TOTAL KNEE ARTHROPLASTY;  Surgeon: Dereck Leep, MD;  Location: [facility] ORS;  Service: Orthopedics;  Laterality: Right;   KNEE ARTHROPLASTY Left 08/28/2021   Procedure: COMPUTER ASSISTED TOTAL KNEE ARTHROPLASTY;  Surgeon: Dereck Leep, MD;  Location: [facility] ORS;  Service: Orthopedics;  Laterality: Left;   VARICOSE VEIN SURGERY Bilateral      Family History  Problem Relation Age of Onset   Cancer Mother        Colon and breast   Anemia Mother    Hypertension Mother    Breast cancer Mother    Cancer Father        Esophageal   Heart disease Father    Diabetes Brother    Cancer Maternal Uncle        Colon   Cancer Maternal Grandmother        Colon   Healthy Brother    Melanoma Brother     Social History   Tobacco Use   Smoking status: Former    Packs/day: 0.25    Years: 2.00    Total pack years: 0.50    Types: Cigarettes    Quit date: 02/10/1970    Years since quitting: 51.7   Smokeless tobacco: Never   Tobacco comments:    smoking cessation materials not required  Substance Use Topics   Alcohol use: No    Alcohol/week: 0.0 standard drinks of alcohol     Current Outpatient Medications:    acetaminophen (TYLENOL) 500 MG tablet, Take 500 mg by mouth every 6 (six) hours as needed., Disp: , Rfl:    amLODipine (NORVASC) 5 MG tablet, Take 1 tablet (5 mg total) by mouth daily., Disp: 90 tablet, Rfl: 1   atenolol-chlorthalidone (TENORETIC) 50-25 MG tablet, Take 1 tablet by mouth daily., Disp: 90 tablet, Rfl: 1   atorvastatin (LIPITOR) 40 MG tablet, Take 1 tablet (40 mg total) by mouth daily., Disp: 90 tablet, Rfl: 1   benazepril (LOTENSIN) 40 MG tablet, Take 1 tablet (40 mg total) by mouth daily., Disp: 90 tablet, Rfl: 1   blood glucose meter kit and supplies, Dispense based on patient and insurance preference. Use up to four times daily as directed. (FOR ICD-10 E10.9, E11.9)., Disp: 1 each, Rfl: 0   Calcium Carb-Cholecalciferol (CALCIUM 600 + D PO), Take 1 tablet by mouth daily., Disp: , Rfl:    Calcium Polycarbophil (FIBER-CAPS PO), Take 1 capsule by mouth daily., Disp: , Rfl:    cetirizine (ZYRTEC) 10 MG tablet, Take 10 mg by mouth daily as needed for allergies., Disp: , Rfl:    diphenhydramine-acetaminophen (TYLENOL PM) 25-500 MG TABS tablet, Take 1 tablet by mouth at bedtime as needed., Disp: , Rfl:     glipiZIDE (GLUCOTROL XL) 2.5 MG 24 hr tablet, Take 1 tablet (2.5 mg total) by mouth daily with breakfast., Disp: 90 tablet, Rfl: 1   glucose blood (ONETOUCH VERIO) test strip, 1 each by Other route in the morning and at bedtime. Use as instructed, Disp: 200 each, Rfl: 5   metFORMIN (GLUCOPHAGE-XR) 750 MG 24 hr tablet, Take 1 tablet (750 mg total) by mouth 2 (two) times daily., Disp: 180 tablet, Rfl: 1   omeprazole (PRILOSEC) 20 MG  capsule, Take 1 capsule (20 mg total) by mouth daily., Disp: 90 capsule, Rfl: 1   oxyCODONE (OXY IR/ROXICODONE) 5 MG immediate release tablet, Take 1 tablet (5 mg total) by mouth every 4 (four) hours as needed for severe pain., Disp: 30 tablet, Rfl: 0   pioglitazone (ACTOS) 30 MG tablet, Take 1 tablet (30 mg total) by mouth daily., Disp: 90 tablet, Rfl: 1   psyllium (REGULOID) 0.52 g capsule, Take 0.52 g by mouth daily., Disp: , Rfl:    traMADol (ULTRAM) 50 MG tablet, Take 1 tablet (50 mg total) by mouth every 4 (four) hours as needed for moderate pain., Disp: 30 tablet, Rfl: 0  Allergies  Allergen Reactions   Codeine Nausea Only    Passed out   Celecoxib Hives   Penicillins Rash    IgE = 17 (WNL) on 04/02/2021    I personally reviewed active problem list, medication list, allergies, family history, social history, health maintenance with the patient/caregiver today.   ROS  Constitutional: Negative for fever or weight change.  Respiratory: Negative for cough and shortness of breath.   Cardiovascular: Negative for chest pain or palpitations.  Gastrointestinal: Negative for abdominal pain, no bowel changes.  Musculoskeletal: positive for gait problem and expected left knee joint swelling.  Skin: Negative for rash.  Neurological: Negative for dizziness or headache.  No other specific complaints in a complete review of systems (except as listed in HPI above).   Objective  Vitals:   10/17/21 0926  BP: 136/70  Pulse: 86  Resp: 16  SpO2: 94%  Weight: 239 lb  (108.4 kg)  Height: _0  (1.778 m)    Body mass index is 34.29 kg/m.  Physical Exam  Constitutional: Patient appears well-developed and well-nourished. Obese  No distress.  HEENT: head atraumatic, normocephalic, pupils equal and reactive to light,, neck supple, throat within normal limits, bruise and swelling on left side of face, no point tenderness during palpation of zigomatic bone  Cardiovascular: Normal rate, regular rhythm and normal heart sounds.  No murmur heard.  Muscular skeletal: antalgic gait, left knee has healing scar from recent surgery, left ankle has edema but normal rom Pulmonary/Chest: Effort normal and breath sounds normal. No respiratory distress. Abdominal: Soft.  There is no tenderness. Muscular skeletal: left knee scar healing well, swollen left ankle  Psychiatric: Patient has a normal mood and affect. behavior is normal. Judgment and thought content normal.   PHQ2/9:    10/17/2021    9:26 AM 06/18/2021    9:00 AM 02/18/2021    9:26 AM 01/10/2021   11:28 AM 10/10/2020    8:47 AM  Depression screen PHQ 2/9  Decreased Interest 0 0 0 0 0  Down, Depressed, Hopeless 0 0 0 0 0  PHQ - 2 Score 0 0 0 0 0  Altered sleeping 0 0 0    Tired, decreased energy 0 0 0    Change in appetite 0 0 0    Feeling bad or failure about yourself  0 0 0    Trouble concentrating 0 0 0    Moving slowly or fidgety/restless 0 0 0    Suicidal thoughts 0 0 0    PHQ-9 Score 0 0 0      phq 9 is negative   Fall Risk:    10/17/2021    9:26 AM 06/18/2021    8:59 AM 02/18/2021    9:25 AM 01/10/2021   11:29 AM 10/10/2020    8:47 AM  Fall  Risk   Falls in the past year? 1 0 0 0 0  Number falls in past yr: 0 0 0 0 0  Injury with Fall? 1 0 0 0 0  Risk for fall due to : Impaired balance/gait No Fall Risks No Fall Risks No Fall Risks No Fall Risks  Follow up _0        Functional Status Survey: Is the patient deaf or have difficulty hearing?: No Does the patient have difficulty seeing, even when wearing glasses/contacts?: No Does the patient have difficulty concentrating, remembering, or making decisions?: No Does the patient have difficulty walking or climbing stairs?: Yes Does the patient have difficulty dressing or bathing?: No Does the patient have difficulty doing errands alone such as visiting a doctor's office or shopping?: No    Assessment & Plan  1. Type 2 diabetes with nephropathy (HCC)  - Microalbumin / creatinine urine ratio - Hemoglobin A1c - glipiZIDE (GLUCOTROL XL) 2.5 MG 24 hr tablet; Take 1 tablet (2.5 mg total) by mouth daily with breakfast.  Dispense: 90 tablet; Refill: 1 - metFORMIN (GLUCOPHAGE-XR) 750 MG 24 hr tablet; Take 1 tablet (750 mg total) by mouth 2 (two) times daily.  Dispense: 180 tablet; Refill: 1 - pioglitazone (ACTOS) 30 MG tablet; Take 1 tablet (30 mg total) by mouth daily.  Dispense: 90 tablet; Refill: 1  2. Osteopenia after menopause  - VITAMIN D 25 Hydroxy (Vit-D Deficiency, Fractures) - TSH  3. Essential (primary) hypertension  - COMPLETE METABOLIC PANEL WITH GFR - CBC with Differential/Platelet - amLODipine (NORVASC) 5 MG tablet; Take 1 tablet (5 mg total) by mouth daily.  Dispense: 90 tablet; Refill: 1 - atenolol-chlorthalidone (TENORETIC) 50-25 MG tablet; Take 1 tablet by mouth daily.  Dispense: 90 tablet; Refill: 1 - benazepril (LOTENSIN) 40 MG tablet; Take 1 tablet (40 mg total) by mouth daily.  Dispense: 90 tablet; Refill: 1  4. Hypertension associated with type 2 diabetes mellitus ([place])   5. Insomnia, persistent   6. Gastro-esophageal reflux disease without esophagitis  - omeprazole (PRILOSEC) 20 MG capsule; Take 1 capsule (20 mg total) by mouth daily.  Dispense: 90 capsule; Refill: 1  7. Dyslipidemia  - Lpid panel - atorvastatin (LIPITOR) 40 MG tablet; Take 1 tablet (40 mg total)  by mouth daily.  Dispense: 90 tablet; Refill: 1  8. Microalbuminuria  - benazepril (LOTENSIN) 40 MG tablet; Take 1 tablet (40 mg total) by mouth daily.  Dispense: 90 tablet; Refill: 1

## 2021-10-17 ENCOUNTER — Ambulatory Visit (INDEPENDENT_AMBULATORY_CARE_PROVIDER_SITE_OTHER): Payer: Medicare HMO | Admitting: Family Medicine

## 2021-10-17 ENCOUNTER — Encounter: Payer: Self-pay | Admitting: Family Medicine

## 2021-10-17 VITALS — BP 136/70 | HR 86 | Resp 16 | Ht 70.0 in | Wt 239.0 lb

## 2021-10-17 DIAGNOSIS — Z96652 Presence of left artificial knee joint: Secondary | ICD-10-CM | POA: Diagnosis not present

## 2021-10-17 DIAGNOSIS — G4700 Insomnia, unspecified: Secondary | ICD-10-CM

## 2021-10-17 DIAGNOSIS — Z780 Asymptomatic menopausal state: Secondary | ICD-10-CM

## 2021-10-17 DIAGNOSIS — M8580 Other specified disorders of bone density and structure, unspecified site: Secondary | ICD-10-CM | POA: Diagnosis not present

## 2021-10-17 DIAGNOSIS — I10 Essential (primary) hypertension: Secondary | ICD-10-CM

## 2021-10-17 DIAGNOSIS — I152 Hypertension secondary to endocrine disorders: Secondary | ICD-10-CM | POA: Diagnosis not present

## 2021-10-17 DIAGNOSIS — R809 Proteinuria, unspecified: Secondary | ICD-10-CM | POA: Diagnosis not present

## 2021-10-17 DIAGNOSIS — M25562 Pain in left knee: Secondary | ICD-10-CM | POA: Diagnosis not present

## 2021-10-17 DIAGNOSIS — Z96653 Presence of artificial knee joint, bilateral: Secondary | ICD-10-CM

## 2021-10-17 DIAGNOSIS — Z9181 History of falling: Secondary | ICD-10-CM

## 2021-10-17 DIAGNOSIS — E1159 Type 2 diabetes mellitus with other circulatory complications: Secondary | ICD-10-CM | POA: Diagnosis not present

## 2021-10-17 DIAGNOSIS — E785 Hyperlipidemia, unspecified: Secondary | ICD-10-CM | POA: Diagnosis not present

## 2021-10-17 DIAGNOSIS — K219 Gastro-esophageal reflux disease without esophagitis: Secondary | ICD-10-CM

## 2021-10-17 DIAGNOSIS — E1121 Type 2 diabetes mellitus with diabetic nephropathy: Secondary | ICD-10-CM | POA: Diagnosis not present

## 2021-10-17 MED ORDER — AMLODIPINE BESYLATE 5 MG PO TABS: 5.0000 mg | ORAL_TABLET | Freq: Every day | ORAL | 1 refills | Status: DC

## 2021-10-17 MED ORDER — BENAZEPRIL HCL 40 MG PO TABS
40.0000 mg | ORAL_TABLET | Freq: Every day | ORAL | 1 refills | Status: DC
Start: 2021-10-17 — End: 2022-01-16

## 2021-10-17 MED ORDER — METFORMIN HCL ER 750 MG PO TB24: 750.0000 mg | ORAL_TABLET | Freq: Two times a day (BID) | ORAL | 1 refills | Status: DC

## 2021-10-17 MED ORDER — OMEPRAZOLE 20 MG PO CPDR: 20.0000 mg | DELAYED_RELEASE_CAPSULE | Freq: Every day | ORAL | 1 refills | Status: DC

## 2021-10-17 MED ORDER — PIOGLITAZONE HCL 30 MG PO TABS
30.0000 mg | ORAL_TABLET | Freq: Every day | ORAL | 1 refills | Status: DC
Start: 2021-10-17 — End: 2022-05-21

## 2021-10-17 MED ORDER — GLIPIZIDE ER 2.5 MG PO TB24: 2.5000 mg | ORAL_TABLET | Freq: Every day | ORAL | 1 refills | Status: DC

## 2021-10-17 MED ORDER — ATENOLOL-CHLORTHALIDONE 50-25 MG PO TABS
1.0000 | ORAL_TABLET | Freq: Every day | ORAL | 1 refills | Status: DC
Start: 2021-10-17 — End: 2022-04-29

## 2021-10-17 MED ORDER — ATORVASTATIN CALCIUM 40 MG PO TABS
40.0000 mg | ORAL_TABLET | Freq: Every day | ORAL | 1 refills | Status: DC
Start: 2021-10-17 — End: 2022-04-29

## 2021-10-18 DIAGNOSIS — Z96652 Presence of left artificial knee joint: Secondary | ICD-10-CM | POA: Diagnosis not present

## 2021-10-18 DIAGNOSIS — R0781 Pleurodynia: Secondary | ICD-10-CM | POA: Diagnosis not present

## 2021-10-18 DIAGNOSIS — M25472 Effusion, left ankle: Secondary | ICD-10-CM | POA: Diagnosis not present

## 2021-10-24 DIAGNOSIS — Z85828 Personal history of other malignant neoplasm of skin: Secondary | ICD-10-CM | POA: Diagnosis not present

## 2021-10-24 DIAGNOSIS — D229 Melanocytic nevi, unspecified: Secondary | ICD-10-CM | POA: Diagnosis not present

## 2021-10-24 DIAGNOSIS — D1801 Hemangioma of skin and subcutaneous tissue: Secondary | ICD-10-CM | POA: Diagnosis not present

## 2021-10-24 DIAGNOSIS — L821 Other seborrheic keratosis: Secondary | ICD-10-CM | POA: Diagnosis not present

## 2021-10-24 DIAGNOSIS — L814 Other melanin hyperpigmentation: Secondary | ICD-10-CM | POA: Diagnosis not present

## 2021-12-03 DIAGNOSIS — Z96652 Presence of left artificial knee joint: Secondary | ICD-10-CM | POA: Diagnosis not present

## 2021-12-03 DIAGNOSIS — M25562 Pain in left knee: Secondary | ICD-10-CM | POA: Diagnosis not present

## 2021-12-06 DIAGNOSIS — Z96652 Presence of left artificial knee joint: Secondary | ICD-10-CM | POA: Diagnosis not present

## 2021-12-10 DIAGNOSIS — M25562 Pain in left knee: Secondary | ICD-10-CM | POA: Diagnosis not present

## 2021-12-10 DIAGNOSIS — Z96652 Presence of left artificial knee joint: Secondary | ICD-10-CM | POA: Diagnosis not present

## 2021-12-13 DIAGNOSIS — M25562 Pain in left knee: Secondary | ICD-10-CM | POA: Diagnosis not present

## 2021-12-13 DIAGNOSIS — Z96652 Presence of left artificial knee joint: Secondary | ICD-10-CM | POA: Diagnosis not present

## 2021-12-16 DIAGNOSIS — M25562 Pain in left knee: Secondary | ICD-10-CM | POA: Diagnosis not present

## 2021-12-16 DIAGNOSIS — Z96652 Presence of left artificial knee joint: Secondary | ICD-10-CM | POA: Diagnosis not present

## 2021-12-18 DIAGNOSIS — Z96652 Presence of left artificial knee joint: Secondary | ICD-10-CM | POA: Diagnosis not present

## 2021-12-18 DIAGNOSIS — M25562 Pain in left knee: Secondary | ICD-10-CM | POA: Diagnosis not present

## 2022-01-13 DIAGNOSIS — M8580 Other specified disorders of bone density and structure, unspecified site: Secondary | ICD-10-CM | POA: Diagnosis not present

## 2022-01-13 DIAGNOSIS — E1121 Type 2 diabetes mellitus with diabetic nephropathy: Secondary | ICD-10-CM | POA: Diagnosis not present

## 2022-01-13 DIAGNOSIS — Z780 Asymptomatic menopausal state: Secondary | ICD-10-CM | POA: Diagnosis not present

## 2022-01-13 DIAGNOSIS — I10 Essential (primary) hypertension: Secondary | ICD-10-CM | POA: Diagnosis not present

## 2022-01-13 DIAGNOSIS — D649 Anemia, unspecified: Secondary | ICD-10-CM | POA: Diagnosis not present

## 2022-01-13 DIAGNOSIS — Z0000 Encounter for general adult medical examination without abnormal findings: Secondary | ICD-10-CM | POA: Diagnosis not present

## 2022-01-13 DIAGNOSIS — E785 Hyperlipidemia, unspecified: Secondary | ICD-10-CM | POA: Diagnosis not present

## 2022-01-13 DIAGNOSIS — Z01419 Encounter for gynecological examination (general) (routine) without abnormal findings: Secondary | ICD-10-CM | POA: Diagnosis not present

## 2022-01-14 ENCOUNTER — Ambulatory Visit (INDEPENDENT_AMBULATORY_CARE_PROVIDER_SITE_OTHER): Payer: Medicare HMO

## 2022-01-14 VITALS — Ht 70.0 in | Wt 239.0 lb

## 2022-01-14 DIAGNOSIS — Z0000 Encounter for general adult medical examination without abnormal findings: Secondary | ICD-10-CM

## 2022-01-14 NOTE — Progress Notes (Addendum)
Subjective:  I connected with  [name] on 01/14/22 by a video and audio enabled telemedicine application and verified that I am speaking with the correct person using two identifiers.  Patient Location: Home  Provider Location: Home Office  I discussed the limitations of evaluation and management by telemedicine. The patient expressed understanding and agreed to proceed.  BRYANNA YIM is a 71 y.o. female who presents for Medicare Annual (Subsequent) preventive examination.  Review of Systems    Defer to PCP       Objective:    There were no vitals filed for this visit. There is no height or weight on file to calculate BMI.     08/28/2021    6:43 AM 08/19/2021    8:33 AM 04/05/2021    6:17 AM 03/27/2021    9:49 AM 01/10/2021   11:29 AM 12/13/2019    2:27 PM 08/24/2018    9:52 AM  Advanced Directives  Does Patient Have a Medical Advance Directive? _0  Yes Yes  Type of Advance Directive Living will  Living will [place];Living will Living will Living will Living will  Does patient want to make changes to medical advance directive? No - Patient declined  No - Patient declined   Yes (MAU/Ambulatory/Procedural Areas - Information given)     Current Medications (verified) Outpatient Encounter Medications as of 01/14/2022  Medication Sig   acetaminophen (TYLENOL) 500 MG tablet Take 500 mg by mouth every 6 (six) hours as needed.   amLODipine (NORVASC) 5 MG tablet Take 1 tablet (5 mg total) by mouth daily.   atenolol-chlorthalidone (TENORETIC) 50-25 MG tablet Take 1 tablet by mouth daily.   atorvastatin (LIPITOR) 40 MG tablet Take 1 tablet (40 mg total) by mouth daily.   benazepril (LOTENSIN) 40 MG tablet Take 1 tablet (40 mg total) by mouth daily.   blood glucose meter kit and supplies Dispense based on patient and insurance preference. Use up to four times daily as directed. (FOR ICD-10 E10.9, E11.9).   Calcium Carb-Cholecalciferol  (CALCIUM 600 + D PO) Take 1 tablet by mouth daily.   Calcium Polycarbophil (FIBER-CAPS PO) Take 1 capsule by mouth daily.   cetirizine (ZYRTEC) 10 MG tablet Take 10 mg by mouth daily as needed for allergies.   diphenhydramine-acetaminophen (TYLENOL PM) 25-500 MG TABS tablet Take 1 tablet by mouth at bedtime as needed.   glipiZIDE (GLUCOTROL XL) 2.5 MG 24 hr tablet Take 1 tablet (2.5 mg total) by mouth daily with breakfast.   glucose blood (ONETOUCH VERIO) test strip 1 each by Other route in the morning and at bedtime. Use as instructed   metFORMIN (GLUCOPHAGE-XR) 750 MG 24 hr tablet Take 1 tablet (750 mg total) by mouth 2 (two) times daily.   omeprazole (PRILOSEC) 20 MG capsule Take 1 capsule (20 mg total) by mouth daily.   oxyCODONE (OXY IR/ROXICODONE) 5 MG immediate release tablet Take 1 tablet (5 mg total) by mouth every 4 (four) hours as needed for severe pain.   pioglitazone (ACTOS) 30 MG tablet Take 1 tablet (30 mg total) by mouth daily.   psyllium (REGULOID) 0.52 g capsule Take 0.52 g by mouth daily.   No facility-administered encounter medications on file as of 01/14/2022.    Allergies (verified) Codeine, Celecoxib, and Penicillins   History: Past Medical History:  Diagnosis Date   Arthritis    Arthrosis    Bilateral leg cramps    Bilateral sciatica  Cancer (HCC)    basil, sarconoma   Chronic insomnia    Diabetes mellitus without complication ([place])    Dyslipidemia    Family history of adverse reaction to anesthesia    mom had PONV   GERD (gastroesophageal reflux disease)    History of colon polyps    History of skin cancer    Dr. Ave Filter, history of basal cell and squamous cells carcinoma   Hypertension    Microalbuminuria    100-DM   Obesity    Plantar fasciitis, left    Dr. Milinda Pointer   Pneumonia    PONV (postoperative nausea and vomiting)    Reflux    Right bundle branch block    Past Surgical History:  Procedure Laterality Date   achiles tendon Left     BREAST BIOPSY Left 2015   benign   FOOT SURGERY Left 08/29/2011   Spur Removal , and Achilles Tendon Tendolysis   KNEE ARTHROPLASTY Right 04/05/2021   Procedure: COMPUTER ASSISTED TOTAL KNEE ARTHROPLASTY;  Surgeon: Dereck Leep, MD;  Location: [facility] ORS;  Service: Orthopedics;  Laterality: Right;   KNEE ARTHROPLASTY Left 08/28/2021   Procedure: COMPUTER ASSISTED TOTAL KNEE ARTHROPLASTY;  Surgeon: Dereck Leep, MD;  Location: [facility] ORS;  Service: Orthopedics;  Laterality: Left;   VARICOSE VEIN SURGERY Bilateral    Family History  Problem Relation Age of Onset   Cancer Mother        Colon and breast   Anemia Mother    Hypertension Mother    Breast cancer Mother    Cancer Father        Esophageal   Heart disease Father    Diabetes Brother    Cancer Maternal Uncle        Colon   Cancer Maternal Grandmother        Colon   Healthy Brother    Melanoma Brother    Social History   Socioeconomic History   Marital status: Married    Spouse name: Iona Beard   Number of children: 2   Years of education: Not on file   Highest education level: 12th grade  Occupational History   Occupation: Retired  Tobacco Use   Smoking status: Former    Packs/day: 0.25    Years: 2.00    Total pack years: 0.50    Types: Cigarettes    Quit date: 02/10/1970    Years since quitting: 51.9   Smokeless tobacco: Never   Tobacco comments:    smoking cessation materials not required  Vaping Use   Vaping Use: Never used  Substance and Sexual Activity   Alcohol use: No    Alcohol/week: 0.0 standard drinks of alcohol   Drug use: No   Sexual activity: Yes  Other Topics Concern   Not on file  Social History Narrative   Not on file   Social Determinants of Health   Financial Resource Strain: Low Risk  (01/10/2021)   Overall Financial Resource Strain (CARDIA)    Difficulty of Paying Living Expenses: Not hard at all  Food Insecurity: No Food Insecurity (01/10/2021)   Hunger Vital Sign    Worried About  Running Out of Food in the Last Year: Never true    Ran Out of Food in the Last Year: Never true  Transportation Needs: No Transportation Needs (01/10/2021)   PRAPARE - Hydrologist (Medical): No    Lack of Transportation (Non-Medical): No  Physical Activity: Inactive (01/10/2021)  Exercise Vital Sign    Days of Exercise per Week: 0 days    Minutes of Exercise per Session: 0 min  Stress: No Stress Concern Present (01/10/2021)   [place]    Feeling of Stress : Not at all  Social Connections: Moderately Integrated (01/10/2021)   Social Connection and Isolation Panel [NHANES]    Frequency of Communication with Friends and Family: More than three times a week    Frequency of Social Gatherings with Friends and Family: More than three times a week    Attends Religious Services: More than 4 times per year    Active Member of [employer] or Organizations: No    Attends Archivist Meetings: Never    Marital Status: Married    Tobacco Counseling Counseling given: Not Answered Tobacco comments: smoking cessation materials not required   Clinical Intake:                 Diabetic?Yes Nutrition Risk Assessment:  Has the patient had any N/V/D within the last 2 months?  No  Does the patient have any non-healing wounds?  No  Has the patient had any unintentional weight loss or weight gain?  No   Diabetes:  Is the patient diabetic?  Yes  If diabetic, was a CBG obtained today?  No  Did the patient bring in their glucometer from home?  N/A How often do you monitor your CBG's? Daily.   Financial Strains and Diabetes Management:  Are you having any financial strains with the device, your supplies or your medication? No .  Does the patient want to be seen by Chronic Care Management for management of their diabetes?  No  Would the patient like to be referred to a Nutritionist or for  Diabetic Management?  No   Diabetic Exams:  Diabetic Eye Exam: Completed April 2023 Diabetic Foot Exam: Overdue, Pt has been advised about the importance in completing this exam. Pt is scheduled for diabetic foot exam on 01/16/2022.          Activities of Daily Living    10/17/2021    9:27 AM 08/28/2021    2:10 PM  In your present state of health, do you have any difficulty performing the following activities:  Hearing? 0 0  Vision? 0 0  Difficulty concentrating or making decisions? 0 0  Walking or climbing stairs? 1 0  Dressing or bathing? 0 0  Doing errands, shopping? 0 0    Patient Care Team: Steele Sizer, MD as PCP - General (Family Medicine) Rubie Maid, MD as Consulting Physician (Obstetrics and Gynecology) Christene Slates, MD as Consulting Physician (Dermatology) Marry Guan, Laurice Record, MD (Orthopedic Surgery)  Indicate any recent Medical Services you may have received from other than [facility] providers in the past year (date may be approximate).     Assessment:   This is a routine wellness examination for Shayla.  Hearing/Vision screen No results found.  Dietary issues and exercise activities discussed:     Goals Addressed   None   Depression Screen    10/17/2021    9:26 AM 06/18/2021    9:00 AM 02/18/2021    9:26 AM 01/10/2021   11:28 AM 10/10/2020    8:47 AM 05/23/2020   11:04 AM 12/13/2019    2:26 PM  PHQ 2/9 Scores  PHQ - 2 Score 0 0 0 0 0 0 0  PHQ- 9 Score 0 0 0  Fall Risk    10/17/2021    9:26 AM 06/18/2021    8:59 AM 02/18/2021    9:25 AM 01/10/2021   11:29 AM 10/10/2020    8:47 AM  Fall Risk   Falls in the past year? 1 0 0 0 0  Number falls in past yr: 0 0 0 0 0  Injury with Fall? 1 0 0 0 0  Risk for fall due to : Impaired balance/gait No Fall Risks No Fall Risks No Fall Risks No Fall Risks  Follow up _0     FALL  RISK PREVENTION PERTAINING TO THE HOME:  Any stairs in or around the home? Yes  If so, are there any without handrails? No  Home free of loose throw rugs in walkways, pet beds, electrical cords, etc? Yes  Adequate lighting in your home to reduce risk of falls? Yes   ASSISTIVE DEVICES UTILIZED TO PREVENT FALLS:  Life alert? No  Use of a cane, walker or w/c? No  Grab bars in the bathroom? Yes  Shower chair or bench in shower? Yes  Elevated toilet seat or a handicapped toilet? Yes   TIMED UP AND GO:  Was the test performed? No .  Length of time to ambulate 10 feet: n/a sec.     Cognitive Function:        08/24/2018   10:01 AM 08/20/2017    9:56 AM  6CIT Screen  What Year? 0 points 0 points  What month? 0 points 0 points  What time? 0 points 0 points  Count back from 20 0 points 0 points  Months in reverse 0 points 0 points  Repeat phrase 0 points 2 points  Total Score 0 points 2 points    Immunizations Immunization History  Administered Date(s) Administered   Influenza, High Dose Seasonal PF 11/06/2014, 10/29/2016, 10/30/2018, 11/02/2019, 12/07/2020   Influenza,inj,Quad PF,6+ Mos 11/06/2014   Influenza-Unspecified 11/09/2013, 12/13/2015, 12/08/2017   PFIZER(Purple Top)SARS-COV-2 Vaccination 03/19/2019, 04/09/2019, 11/08/2019   Pfizer Covid-19 Vaccine Bivalent Booster 69yr & up 11/06/2020   Pneumococcal Conjugate-13 02/28/2014   Pneumococcal Polysaccharide-23 02/21/2010, 04/10/2015   Tdap 01/07/2010, 10/10/2020   Zoster Recombinat (Shingrix) 09/18/2017, 01/19/2018   Zoster, Live 10/27/2011    TDAP status: Up to date  Flu Vaccine status: Up to date  Pneumococcal vaccine status: Up to date  Covid-19 vaccine status: Completed vaccines  Qualifies for Shingles Vaccine? Yes   Zostavax completed No   Shingrix Completed?: Yes  Screening Tests Health Maintenance  Topic Date Due   INFLUENZA VACCINE  09/10/2021   COVID-19 Vaccine (5 - 2023-24 season) 10/11/2021    OPHTHALMOLOGY EXAM  01/10/2022   FOOT EXAM  02/18/2022   HEMOGLOBIN A1C  07/15/2022   MAMMOGRAM  08/13/2022   COLONOSCOPY (Pts 45-468yrInsurance coverage will need to be confirmed)  10/28/2022   Diabetic kidney evaluation - GFR measurement  01/14/2023   Diabetic kidney evaluation - Urine ACR  01/14/2023   Medicare Annual Wellness (AWV)  01/15/2023   DTaP/Tdap/Td (3 - Td or Tdap) 10/11/2030   Pneumonia Vaccine 6574Years old  Completed   DEXA SCAN  Completed   Hepatitis C Screening  Completed   Zoster Vaccines- Shingrix  Completed   HPV VACCINES  Aged Out    Health Maintenance  Health Maintenance Due  Topic Date Due   INFLUENZA VACCINE  09/10/2021   COVID-19 Vaccine (5 - 2023-24 season) 10/11/2021  OPHTHALMOLOGY EXAM  01/10/2022    Colorectal cancer screening: Type of screening: Colonoscopy. Completed 10/27/2017. Repeat every 5 years  Mammogram status: Completed 08/12/2021. Repeat every year  Bone Density status: Completed 08/12/2021. Results reflect: Bone density results: NORMAL. Repeat every 2 years.  Lung Cancer Screening: (Low Dose CT Chest recommended if Age 51-80 years, 30 pack-year currently smoking OR have quit w/in 15years.) does not qualify.   Lung Cancer Screening Referral: N/A  Additional Screening:  Hepatitis C Screening: does not qualify; Completed 10/27/2011  Vision Screening: Recommended annual ophthalmology exams for early detection of glaucoma and other disorders of the eye. Is the patient up to date with their annual eye exam?  Yes  Who is the provider or what is the name of the office in which the patient attends annual eye exams? Dr. Gloriann Loan  If pt is not established with a provider, would they like to be referred to a provider to establish care? No .   Dental Screening: Recommended annual dental exams for proper oral hygiene  Community Resource Referral / Chronic Care Management: CRR required this visit?  No   CCM required this visit?  No       Plan:     I have personally reviewed and noted the following in the patient's chart:   Medical and social history Use of alcohol, tobacco or illicit drugs  Current medications and supplements including opioid prescriptions. Patient is not currently taking opioid prescriptions. Functional ability and status Nutritional status Physical activity Advanced directives List of other physicians Hospitalizations, surgeries, and ER visits in previous 12 months Vitals Screenings to include cognitive, depression, and falls Referrals and appointments  In addition, I have reviewed and discussed with patient certain preventive protocols, quality metrics, and best practice recommendations. A written personalized care plan for preventive services as well as general preventive health recommendations were provided to patient.     Royal Hawthorn, [place]   01/14/2022   Nurse Notes:  Ms. Ivanov , Thank you for taking time to come for your Medicare Wellness Visit. I appreciate your ongoing commitment to your health goals. Please review the following plan we discussed and let me know if I can assist you in the future.   These are the goals we discussed:  Goals      DIET - INCREASE WATER INTAKE     Recommend to drink at least 6-8 8oz glasses of water per day.     Patient Stated     Pt states she would like to be able to get off of ozempic due to cost and maintain lower fasting blood sugars.         This is a list of the screening recommended for you and due dates:  Health Maintenance  Topic Date Due   Flu Shot  09/10/2021   COVID-19 Vaccine (5 - 2023-24 season) 10/11/2021   Eye exam for diabetics  01/10/2022   Complete foot exam   02/18/2022   Hemoglobin A1C  07/15/2022   Mammogram  08/13/2022   Colon Cancer Screening  10/28/2022   Yearly kidney function blood test for diabetes  01/14/2023   Yearly kidney health urinalysis for diabetes  01/14/2023   Medicare Annual Wellness Visit   01/15/2023   DTaP/Tdap/Td vaccine (3 - Td or Tdap) 10/11/2030   Pneumonia Vaccine  Completed   DEXA scan (bone density measurement)  Completed   Hepatitis C Screening: USPSTF Recommendation to screen - Ages 60-79 yo.  Completed   Zoster (Shingles)  Vaccine  Completed   HPV Vaccine  Aged Out     I have reviewed this encounter including the documentation in this note and/or discussed this patient with the provider, Rexford Maus, [place]. I am certifying that I agree with the content of this note as supervising physician.  Steele Sizer, MD [facility] 01/20/2022, 4:44 PM

## 2022-01-15 ENCOUNTER — Other Ambulatory Visit: Payer: Self-pay

## 2022-01-15 DIAGNOSIS — D649 Anemia, unspecified: Secondary | ICD-10-CM

## 2022-01-15 NOTE — Progress Notes (Unsigned)
Name: Michelle Montgomery   MRN: [id number]    DOB: 21-Dec-1950   Date:01/16/2022       Progress Note  Subjective  Chief Complaint  Follow Up  HPI  DMII with nephropathy: A1C spiked to  9.3 % April 2021 , she was able to changed her diet, was more compliant with metformin and pioglitazone and low dose Glipizide. A1C was 7.9% down to 6.9 % after that  7.9 % and down to 7.1 % , 7.6 % and a few days ago it was up to 7.8%  She states glucose fasting has been around 120's   She is on statin therapy for dyslipidemia and also on ACE for microalbuminuria, HTN is under control.     HTN: BP is at goal, taking Tenoretic, Norvasc and Benazepril ,  she denies chest pain, palpitation, dizziness or SOB., she states long history of dry coughing spells, going on for years, we will try changing from ACE to ARB . BP is towards low end of normal but denies dizziness.    Hyperlipidemia: taking Atorvastatin, she denies myalgia. Reviewed last labs, LDL is still at goal, however triglycerides was up and HDL was low    Insomnia: she is now off Ambien, taking melatonin and Tylenol Pm- discussed risk of Tylenol pm , and sleeps well most of the time. She tried Trazodone but did not help. 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 She saw ENT and he suggested MRI brain but she decided to hold off  . She states mother, maternal grandmother, maternal aunt and cousin have the same problems  Unchanged    GERD: taking Omeprazole, and no heartburn or regurgitation noticed.  She is doing well, she states when she skips medication for 3 days symptoms returns. Unchanged   OA: she has a total knee replacement 04/05/2021 and left side on 07/03, she is doing better    Osteopenia: repeat bone density done 08/2021 showed Frax slightly worse at 9.7 T and risk of hip fracture of 1.5 % , continue high dose  calcium and vitamin D , plus physical activity . Stable   Iron deficiency anemia: she is due for colonoscopy in 2024 and we will place a referral now   Patient Active Problem List   Diagnosis Date Noted   Hypertension associated with type 2 diabetes mellitus ([place]) 10/17/2021   Osteopenia after menopause 06/18/2021   Total knee replacement status 04/05/2021   Primary osteoarthritis of left knee 03/11/2021   Varicose veins of both lower extremities 06/12/2016   BPV (benign positional vertigo), right 08/24/2015   Smell disturbance 01/16/2015   Osteoarthritis 11/03/2014   Cramps of lower extremity 11/03/2014   Neuralgia neuritis, sciatic nerve 11/03/2014   Insomnia, persistent 11/03/2014   Dyslipidemia 11/03/2014   Type 2 diabetes with nephropathy ([place]) 11/03/2014   Essential (primary) hypertension 11/03/2014   History of colon polyps 11/03/2014   H/O malignant neoplasm of skin 11/03/2014   Microalbuminuria 11/03/2014   Plantar fasciitis 11/03/2014   Gastro-esophageal reflux disease without esophagitis 11/03/2014   Bundle branch block, right 11/03/2014    Past Surgical History:  Procedure Laterality Date   achiles tendon Left    BREAST BIOPSY Left 2015   benign   FOOT SURGERY Left 08/29/2011   Spur Removal , and Achilles Tendon Tendolysis   KNEE ARTHROPLASTY Right 04/05/2021  Procedure: COMPUTER ASSISTED TOTAL KNEE ARTHROPLASTY;  Surgeon: Dereck Leep, MD;  Location: [facility] ORS;  Service: Orthopedics;  Laterality: Right;   KNEE ARTHROPLASTY Left 08/28/2021   Procedure: COMPUTER ASSISTED TOTAL KNEE ARTHROPLASTY;  Surgeon: Dereck Leep, MD;  Location: [facility] ORS;  Service: Orthopedics;  Laterality: Left;   VARICOSE VEIN SURGERY Bilateral     Family History  Problem Relation Age of Onset   Cancer Mother        Colon and breast   Anemia Mother    Hypertension Mother    Breast cancer Mother    Cancer Father        Esophageal   Heart disease Father    Diabetes Brother     Cancer Maternal Uncle        Colon   Cancer Maternal Grandmother        Colon   Healthy Brother    Melanoma Brother     Social History   Tobacco Use   Smoking status: Former    Packs/day: 0.25    Years: 2.00    Total pack years: 0.50    Types: Cigarettes    Quit date: 02/10/1970    Years since quitting: 51.9   Smokeless tobacco: Never   Tobacco comments:    smoking cessation materials not required  Substance Use Topics   Alcohol use: No    Alcohol/week: 0.0 standard drinks of alcohol     Current Outpatient Medications:    acetaminophen (TYLENOL) 500 MG tablet, Take 500 mg by mouth every 6 (six) hours as needed., Disp: , Rfl:    amLODipine (NORVASC) 5 MG tablet, Take 1 tablet (5 mg total) by mouth daily., Disp: 90 tablet, Rfl: 1   atenolol-chlorthalidone (TENORETIC) 50-25 MG tablet, Take 1 tablet by mouth daily., Disp: 90 tablet, Rfl: 1   atorvastatin (LIPITOR) 40 MG tablet, Take 1 tablet (40 mg total) by mouth daily., Disp: 90 tablet, Rfl: 1   benazepril (LOTENSIN) 40 MG tablet, Take 1 tablet (40 mg total) by mouth daily., Disp: 90 tablet, Rfl: 1   blood glucose meter kit and supplies, Dispense based on patient and insurance preference. Use up to four times daily as directed. (FOR ICD-10 E10.9, E11.9)., Disp: 1 each, Rfl: 0   Calcium Carb-Cholecalciferol (CALCIUM 600 + D PO), Take 1 tablet by mouth daily., Disp: , Rfl:    Calcium Polycarbophil (FIBER-CAPS PO), Take 1 capsule by mouth daily., Disp: , Rfl:    cetirizine (ZYRTEC) 10 MG tablet, Take 10 mg by mouth daily as needed for allergies., Disp: , Rfl:    diphenhydramine-acetaminophen (TYLENOL PM) 25-500 MG TABS tablet, Take 1 tablet by mouth at bedtime as needed., Disp: , Rfl:    FLUAD QUADRIVALENT 0.5 ML injection, , Disp: , Rfl:    glipiZIDE (GLUCOTROL XL) 2.5 MG 24 hr tablet, Take 1 tablet (2.5 mg total) by mouth daily with breakfast., Disp: 90 tablet, Rfl: 1   glucose blood (ONETOUCH VERIO) test strip, 1 each by Other  route in the morning and at bedtime. Use as instructed, Disp: 200 each, Rfl: 5   metFORMIN (GLUCOPHAGE-XR) 750 MG 24 hr tablet, Take 1 tablet (750 mg total) by mouth 2 (two) times daily., Disp: 180 tablet, Rfl: 1   omeprazole (PRILOSEC) 20 MG capsule, Take 1 capsule (20 mg total) by mouth daily., Disp: 90 capsule, Rfl: 1   pioglitazone (ACTOS) 30 MG tablet, Take 1 tablet (30 mg total) by mouth daily., Disp: 90 tablet, Rfl: 1  psyllium (REGULOID) 0.52 g capsule, Take 0.52 g by mouth daily., Disp: , Rfl:    oxyCODONE (OXY IR/ROXICODONE) 5 MG immediate release tablet, Take 1 tablet (5 mg total) by mouth every 4 (four) hours as needed for severe pain. (Patient not taking: Reported on 01/16/2022), Disp: 30 tablet, Rfl: 0  Allergies  Allergen Reactions   Codeine Nausea Only    Passed out   Celecoxib Hives   Penicillins Rash    IgE = 17 (WNL) on 04/02/2021    I personally reviewed active problem list, medication list, allergies, family history, social history, health maintenance with the patient/caregiver today.   ROS  Ten systems reviewed and is negative except as mentioned in HPI   Objective  Vitals:   01/16/22 0931  BP: 118/68  Pulse: 75  Resp: 16  SpO2: 96%  Weight: 247 lb (112 kg)  Height: _0  (1.778 m)    Body mass index is 35.44 kg/m.  Physical Exam  Constitutional: Patient appears well-developed and well-nourished. Obese  No distress.  HEENT: head atraumatic, normocephalic, pupils equal and reactive to light, neck supple Cardiovascular: Normal rate, regular rhythm and normal heart sounds.  No murmur heard. No BLE edema. Pulmonary/Chest: Effort normal and breath sounds normal. No respiratory distress. Abdominal: Soft.  There is no tenderness. Psychiatric: Patient has a normal mood and affect. behavior is normal. Judgment and thought content normal.   Recent Results (from the past 2160 hour(s))  Lipid panel     Status: Abnormal   Collection Time: 01/13/22 10:17 AM   Result Value Ref Range   Cholesterol 123 <200 mg/dL   HDL 35 (L) > OR = 50 mg/dL   Triglycerides 214 (H) <150 mg/dL    Comment: . If a non-fasting specimen was collected, consider repeat triglyceride testing on a fasting specimen if clinically indicated.  Yates Decamp et al. J. of Clin. Lipidol. 2671;2:458-099. Marland Kitchen    LDL Cholesterol (Calc) 60 mg/dL (calc)    Comment: Reference range: <100 . Desirable range <100 mg/dL for primary prevention;   <70 mg/dL for patients with CHD or diabetic patients  with > or = 2 CHD risk factors. Marland Kitchen LDL-C is now calculated using the Martin-Hopkins  calculation, which is a validated novel method providing  better accuracy than the Friedewald equation in the  estimation of LDL-C.  Cresenciano Genre et al. Annamaria Helling. 8338;250(53): 2061-2068  (http://education.[facility].com/faq/FAQ164)    Total CHOL/HDL Ratio 3.5 <5.0 (calc)   Non-HDL Cholesterol (Calc) 88 <130 mg/dL (calc)    Comment: For patients with diabetes plus 1 major ASCVD risk  factor, treating to a non-HDL-C goal of <100 mg/dL  (LDL-C of <70 mg/dL) is considered a therapeutic  option.   Microalbumin / creatinine urine ratio     Status: None   Collection Time: 01/13/22 10:17 AM  Result Value Ref Range   Creatinine, Urine 91 20 - 275 mg/dL   Microalb, Ur 0.9 mg/dL    Comment: Reference Range Not established    Microalb Creat Ratio 10 <30 mcg/mg creat    Comment: . The ADA defines abnormalities in albumin excretion as follows: Marland Kitchen Albuminuria Category        Result (mcg/mg creatinine) . Normal to Mildly increased   <30 Moderately increased         30-299  Severely increased           > OR = 300 . The ADA recommends that at least two of three specimens collected within a 3-6 month period  be abnormal before considering a patient to be within a diagnostic category.   COMPLETE METABOLIC PANEL WITH GFR     Status: Abnormal   Collection Time: 01/13/22 10:17 AM  Result Value Ref Range    Glucose, Bld 128 (H) 65 - 99 mg/dL    Comment: .            Fasting reference interval . For someone without known diabetes, a glucose value >125 mg/dL indicates that they may have diabetes and this should be confirmed with a follow-up test. .    BUN 18 7 - 25 mg/dL   Creat 0.84 0.60 - 1.00 mg/dL   eGFR 73 > OR = 60 mL/min/1.89m   BUN/Creatinine Ratio SEE NOTE: 6 - 22 (calc)    Comment:    Not Reported: BUN and Creatinine are within    reference range. .    Sodium 140 135 - 146 mmol/L   Potassium 4.0 3.5 - 5.3 mmol/L   Chloride 101 98 - 110 mmol/L   CO2 30 20 - 32 mmol/L   Calcium 9.7 8.6 - 10.4 mg/dL   Total Protein 7.1 6.1 - 8.1 g/dL   Albumin 4.4 3.6 - 5.1 g/dL   Globulin 2.7 1.9 - 3.7 g/dL (calc)   AG Ratio 1.6 1.0 - 2.5 (calc)   Total Bilirubin 0.4 0.2 - 1.2 mg/dL   Alkaline phosphatase (APISO) 46 37 - 153 U/L   AST 14 10 - 35 U/L   ALT 14 6 - 29 U/L  Hemoglobin A1c     Status: Abnormal   Collection Time: 01/13/22 10:17 AM  Result Value Ref Range   Hgb A1c MFr Bld 7.8 (H) <5.7 % of total Hgb    Comment: For someone without known diabetes, a hemoglobin A1c value of 6.5% or greater indicates that they may have  diabetes and this should be confirmed with a follow-up  test. . For someone with known diabetes, a value <7% indicates  that their diabetes is well controlled and a value  greater than or equal to 7% indicates suboptimal  control. A1c targets should be individualized based on  duration of diabetes, age, comorbid conditions, and  other considerations. . Currently, no consensus exists regarding use of hemoglobin A1c for diagnosis of diabetes for children. .    Mean Plasma Glucose 177 mg/dL   eAG (mmol/L) 9.8 mmol/L  CBC with Differential/Platelet     Status: Abnormal   Collection Time: 01/13/22 10:17 AM  Result Value Ref Range   WBC 4.9 3.8 - 10.8 Thousand/uL   RBC 4.06 3.80 - 5.10 Million/uL   Hemoglobin 11.4 (L) 11.7 - 15.5 g/dL   HCT 35.0 35.0 -  45.0 %   MCV 86.2 80.0 - 100.0 fL   MCH 28.1 27.0 - 33.0 pg   MCHC 32.6 32.0 - 36.0 g/dL   RDW 13.7 11.0 - 15.0 %   Platelets 219 140 - 400 Thousand/uL   MPV 8.5 7.5 - 12.5 fL   Neutro Abs 2,646 1,500 - 7,800 cells/uL   Lymphs Abs 1,196 850 - 3,900 cells/uL   Absolute Monocytes 519 200 - 950 cells/uL   Eosinophils Absolute 510 (H) 15 - 500 cells/uL   Basophils Absolute 29 0 - 200 cells/uL   Neutrophils Relative % 54 %   Total Lymphocyte 24.4 %   Monocytes Relative 10.6 %   Eosinophils Relative 10.4 %   Basophils Relative 0.6 %  VITAMIN D 25 Hydroxy (Vit-D Deficiency, Fractures)  Status: None   Collection Time: 01/13/22 10:17 AM  Result Value Ref Range   Vit D, 25-Hydroxy 45 30 - 100 ng/mL    Comment: Vitamin D Status         25-OH Vitamin D: . Deficiency:                    <20 ng/mL Insufficiency:             20 - 29 ng/mL Optimal:                 > or = 30 ng/mL . For 25-OH Vitamin D testing on patients on  D2-supplementation and patients for whom quantitation  of D2 and D3 fractions is required, the [facility](TM) 25-OH VIT D, (D2,D3), LC/MS/MS is recommended: order  code [phone] (patients >39yr). . See Note 1 . Note 1 . For additional information, please refer to  http://education.[facility].com/faq/FAQ199  (This link is being provided for informational/ educational purposes only.)   TSH     Status: None   Collection Time: 01/13/22 10:17 AM  Result Value Ref Range   TSH 2.21 0.40 - 4.50 mIU/L  Iron, TIBC and Ferritin Panel     Status: Abnormal   Collection Time: 01/13/22 10:17 AM  Result Value Ref Range   Iron 52 45 - 160 mcg/dL   TIBC 403 250 - 450 mcg/dL (calc)   %SAT 13 (L) 16 - 45 % (calc)   Ferritin 10 (L) 16 - 288 ng/mL  TEST AUTHORIZATION     Status: None   Collection Time: 01/13/22 10:17 AM  Result Value Ref Range   TEST NAME: IRON, TIBC AND FERRITIN PANEL    TEST CODE: [id number]    CLIENT CONTACT: Candus Braud    REPORT ALWAYS MESSAGE  SIGNATURE      Comment: . The laboratory testing on this patient was verbally requested or confirmed by the ordering physician or his or her authorized representative after contact with an employee of [employer] Federal regulations require that we maintain on file written authorization for all laboratory testing.  Accordingly we are asking that the ordering physician or his or her authorized representative sign a copy of this report and promptly return it to the client service representative. . . Signature:____________________________________________________ . Please fax this signed page to [phone] return it via your [employer].     PHQ2/9:    01/16/2022    9:30 AM 01/14/2022   11:42 AM 10/17/2021    9:26 AM 06/18/2021    9:00 AM 02/18/2021    9:26 AM  Depression screen PHQ 2/9  Decreased Interest 0 0 0 0 0  Down, Depressed, Hopeless 0 0 0 0 0  PHQ - 2 Score 0 0 0 0 0  Altered sleeping 0 0 0 0 0  Tired, decreased energy 0 0 0 0 0  Change in appetite 0 0 0 0 0  Feeling bad or failure about yourself  0 0 0 0 0  Trouble concentrating 0 0 0 0 0  Moving slowly or fidgety/restless 0 0 0 0 0  Suicidal thoughts 0 0 0 0 0  PHQ-9 Score 0 0 0 0 0    phq 9 is negative   Fall Risk:    01/16/2022    9:30 AM 01/14/2022   11:43 AM 10/17/2021    9:26 AM 06/18/2021    8:59 AM 02/18/2021    9:25 AM  Fall Risk   Falls in  the past year? _0 0 0  Number falls in past yr: 0 0 0 0 0  Injury with Fall? _1 0 0  Risk for fall due to : No Fall Risks History of fall(s) Impaired balance/gait No Fall Risks No Fall Risks  Follow up Falls prevention discussed Education provided;Falls prevention discussed Falls prevention discussed Falls prevention discussed Falls prevention discussed      Functional Status Survey: Is the patient deaf or have difficulty hearing?: No Does the patient have difficulty seeing, even when wearing glasses/contacts?: No Does the patient have  difficulty concentrating, remembering, or making decisions?: No Does the patient have difficulty walking or climbing stairs?: No Does the patient have difficulty dressing or bathing?: No Does the patient have difficulty doing errands alone such as visiting a doctor's office or shopping?: No    Assessment & Plan  1. Type 2 diabetes with nephropathy ([place])  - Ambulatory referral to diabetic education  2. Hypertension associated with type 2 diabetes mellitus ([facility])  - olmesartan (BENICAR) 20 MG tablet; Take 1 tablet (20 mg total) by mouth daily. In place of benazepril  Dispense: 90 tablet; Refill: 0  3. Morbid obesity ([place])  Discussed with the patient the risk posed by an increased BMI. Discussed importance of portion control, calorie counting and at least 150 minutes of physical activity weekly. Avoid sweet beverages and drink more water. Eat at least 6 servings of fruit and vegetables daily    4. Insomnia, persistent  - QUEtiapine (SEROQUEL) 25 MG tablet; Take 1 tablet (25 mg total) by mouth at bedtime.  Dispense: 90 tablet; Refill: 0  5. Osteopenia after menopause  Reviewed bone density   6. History of bilateral knee replacement   7. Gastro-esophageal reflux disease without esophagitis  Follow up with GI  8. Smell disturbance   9. Other iron deficiency anemia  - Ambulatory referral to Gastroenterology

## 2022-01-16 ENCOUNTER — Ambulatory Visit (INDEPENDENT_AMBULATORY_CARE_PROVIDER_SITE_OTHER): Payer: Medicare HMO | Admitting: Family Medicine

## 2022-01-16 ENCOUNTER — Encounter: Payer: Self-pay | Admitting: Family Medicine

## 2022-01-16 VITALS — BP 118/68 | HR 75 | Resp 16 | Ht 70.0 in | Wt 247.0 lb

## 2022-01-16 DIAGNOSIS — D508 Other iron deficiency anemias: Secondary | ICD-10-CM

## 2022-01-16 DIAGNOSIS — E1121 Type 2 diabetes mellitus with diabetic nephropathy: Secondary | ICD-10-CM | POA: Diagnosis not present

## 2022-01-16 DIAGNOSIS — I152 Hypertension secondary to endocrine disorders: Secondary | ICD-10-CM

## 2022-01-16 DIAGNOSIS — Z780 Asymptomatic menopausal state: Secondary | ICD-10-CM | POA: Diagnosis not present

## 2022-01-16 DIAGNOSIS — G4700 Insomnia, unspecified: Secondary | ICD-10-CM | POA: Diagnosis not present

## 2022-01-16 DIAGNOSIS — R439 Unspecified disturbances of smell and taste: Secondary | ICD-10-CM

## 2022-01-16 DIAGNOSIS — E1159 Type 2 diabetes mellitus with other circulatory complications: Secondary | ICD-10-CM | POA: Diagnosis not present

## 2022-01-16 DIAGNOSIS — M8580 Other specified disorders of bone density and structure, unspecified site: Secondary | ICD-10-CM | POA: Diagnosis not present

## 2022-01-16 DIAGNOSIS — K219 Gastro-esophageal reflux disease without esophagitis: Secondary | ICD-10-CM | POA: Diagnosis not present

## 2022-01-16 DIAGNOSIS — Z96653 Presence of artificial knee joint, bilateral: Secondary | ICD-10-CM

## 2022-01-16 LAB — VITAMIN D 25 HYDROXY (VIT D DEFICIENCY, FRACTURES): Vit D, 25-Hydroxy: 45 ng/mL (ref 30–100)

## 2022-01-16 LAB — COMPLETE METABOLIC PANEL WITH GFR
AG Ratio: 1.6 (calc) (ref 1.0–2.5)
ALT: 14 U/L (ref 6–29)
AST: 14 U/L (ref 10–35)
Albumin: 4.4 g/dL (ref 3.6–5.1)
Alkaline phosphatase (APISO): 46 U/L (ref 37–153)
BUN: 18 mg/dL (ref 7–25)
CO2: 30 mmol/L (ref 20–32)
Calcium: 9.7 mg/dL (ref 8.6–10.4)
Chloride: 101 mmol/L (ref 98–110)
Creat: 0.84 mg/dL (ref 0.60–1.00)
Globulin: 2.7 g/dL (calc) (ref 1.9–3.7)
Glucose, Bld: 128 mg/dL — ABNORMAL HIGH (ref 65–99)
Potassium: 4 mmol/L (ref 3.5–5.3)
Sodium: 140 mmol/L (ref 135–146)
Total Bilirubin: 0.4 mg/dL (ref 0.2–1.2)
Total Protein: 7.1 g/dL (ref 6.1–8.1)
eGFR: 73 mL/min/{1.73_m2} (ref >=60)

## 2022-01-16 LAB — CBC WITH DIFFERENTIAL/PLATELET
Absolute Monocytes: 519 cells/uL (ref 200–950)
Basophils Absolute: 29 cells/uL (ref 0–200)
Basophils Relative: 0.6 %
Eosinophils Absolute: 510 cells/uL — ABNORMAL HIGH (ref 15–500)
Eosinophils Relative: 10.4 %
HCT: 35 % (ref 35.0–45.0)
Hemoglobin: 11.4 g/dL — ABNORMAL LOW (ref 11.7–15.5)
Lymphs Abs: 1196 cells/uL (ref 850–3900)
MCH: 28.1 pg (ref 27.0–33.0)
MCHC: 32.6 g/dL (ref 32.0–36.0)
MCV: 86.2 fL (ref 80.0–100.0)
MPV: 8.5 fL (ref 7.5–12.5)
Monocytes Relative: 10.6 %
Neutro Abs: 2646 cells/uL (ref 1500–7800)
Neutrophils Relative %: 54 %
Platelets: 219 10*3/uL (ref 140–400)
RBC: 4.06 10*6/uL (ref 3.80–5.10)
RDW: 13.7 % (ref 11.0–15.0)
Total Lymphocyte: 24.4 %
WBC: 4.9 10*3/uL (ref 3.8–10.8)

## 2022-01-16 LAB — LIPID PANEL
Cholesterol: 123 mg/dL (ref <200)
HDL: 35 mg/dL — ABNORMAL LOW (ref >=50)
LDL Cholesterol (Calc): 60 mg/dL (calc)
Non-HDL Cholesterol (Calc): 88 mg/dL (calc) (ref <130)
Total CHOL/HDL Ratio: 3.5 (calc) (ref <5.0)
Triglycerides: 214 mg/dL — ABNORMAL HIGH (ref <150)

## 2022-01-16 LAB — TEST AUTHORIZATION

## 2022-01-16 LAB — MICROALBUMIN / CREATININE URINE RATIO
Creatinine, Urine: 91 mg/dL (ref 20–275)
Microalb Creat Ratio: 10 mcg/mg creat (ref <30)
Microalb, Ur: 0.9 mg/dL

## 2022-01-16 LAB — IRON,TIBC AND FERRITIN PANEL
%SAT: 13 % (calc) — ABNORMAL LOW (ref 16–45)
Ferritin: 10 ng/mL — ABNORMAL LOW (ref 16–288)
Iron: 52 ug/dL (ref 45–160)
TIBC: 403 mcg/dL (calc) (ref 250–450)

## 2022-01-16 LAB — TSH: TSH: 2.21 mIU/L (ref 0.40–4.50)

## 2022-01-16 LAB — HEMOGLOBIN A1C
Hgb A1c MFr Bld: 7.8 % of total Hgb — ABNORMAL HIGH (ref <5.7)
Mean Plasma Glucose: 177 mg/dL
eAG (mmol/L): 9.8 mmol/L

## 2022-01-16 MED ORDER — OLMESARTAN MEDOXOMIL 20 MG PO TABS: 20.0000 mg | ORAL_TABLET | Freq: Every day | ORAL | 0 refills | Status: DC

## 2022-01-16 MED ORDER — QUETIAPINE FUMARATE 25 MG PO TABS: 25.0000 mg | ORAL_TABLET | Freq: Every day | ORAL | 0 refills | Status: DC

## 2022-03-03 ENCOUNTER — Encounter: Payer: Medicare HMO | Attending: Family Medicine | Admitting: *Deleted

## 2022-03-03 ENCOUNTER — Encounter: Payer: Self-pay | Admitting: *Deleted

## 2022-03-03 VITALS — BP 128/70 | Ht 70.0 in | Wt 253.0 lb

## 2022-03-03 DIAGNOSIS — Z713 Dietary counseling and surveillance: Secondary | ICD-10-CM | POA: Diagnosis not present

## 2022-03-03 DIAGNOSIS — E119 Type 2 diabetes mellitus without complications: Secondary | ICD-10-CM | POA: Diagnosis not present

## 2022-03-03 NOTE — Progress Notes (Signed)
Diabetes Self-Management Education  Visit Type: First/Initial  Appt. Start Time: 1325 Appt. End Time: 1610  03/03/2022  [name], identified by name and date of birth, is a 72 y.o. female with a diagnosis of Diabetes: Type 2.   ASSESSMENT  Blood pressure 128/70, height '5\' 10"'$  (1.778 m), weight 253 lb (114.8 kg). Body mass index is 36.3 kg/m.   Diabetes Self-Management Education - 03/03/22 1454       Visit Information   Visit Type First/Initial      Initial Visit   Diabetes Type Type 2    Date Diagnosed 15 years    Are you currently following a meal plan? No    Are you taking your medications as prescribed? Yes      Health Coping   How would you rate your overall health? Good      Psychosocial Assessment   Patient Belief/Attitude about Diabetes Other (comment)   "wish I didn't have it"   What is the hardest part about your diabetes right now, causing you the most concern, or is the most worrisome to you about your diabetes?   Making healty food and beverage choices    Self-care barriers None    Self-management support Doctor's office;Family    Patient Concerns Nutrition/Meal planning;Glycemic Control;Medication;Weight Control;Monitoring;Healthy Lifestyle    Special Needs None    Preferred Learning Style Visual;Other (comment)   talking/discussion   How often do you need to have someone help you when you read instructions, pamphlets, or other written materials from your doctor or pharmacy? 1 - Never    What is the last grade level you completed in school? 12th      Pre-Education Assessment   Patient understands the diabetes disease and treatment process. Needs Review    Patient understands incorporating nutritional management into lifestyle. Needs Instruction    Patient undertands incorporating physical activity into lifestyle. Needs Review    Patient understands using medications safely. Needs Review    Patient understands monitoring blood glucose,  interpreting and using results Needs Review    Patient understands prevention, detection, and treatment of acute complications. Needs Instruction    Patient understands prevention, detection, and treatment of chronic complications. Needs Review    Patient understands how to develop strategies to address psychosocial issues. Needs Review    Patient understands how to develop strategies to promote health/change behavior. Needs Review      Complications   Last HgB A1C per patient/outside source 7.8 %   01/13/2022   How often do you check your blood sugar? 1-2 times/day    Fasting Blood glucose range (mg/dL) 130-179   She reports FBG's 130-140's mg/dL.   Have you had a dilated eye exam in the past 12 months? No    Have you had a dental exam in the past 12 months? No    Are you checking your feet? Yes    How many days per week are you checking your feet? 7      Dietary Intake   Breakfast bacon, egg and cheese biscuit, occasional grits    Snack (morning) 0-1 snacks - sweets/desserts    Lunch reports 1 good meal/day - usually brunch or lunch then snack around supper - hamburger with tots, taco salad, pork ribs, fried chicken, catfish, grilled salmon; beans, broccoli, cauliflower, cabbage, pasta, salads, squash, tomatoes, cuccumbers, carrots    Dinner cereal and milk; peanut butter crackers, 1/2 cheese sandwich    Beverage(s) water, unsweetened tea, coffee  Activity / Exercise   Activity / Exercise Type Light (walking / raking leaves)    How many days per week do you exercise? 5.5    How many minutes per day do you exercise? 12.5    Total minutes per week of exercise 68.75      Patient Education   Previous Diabetes Education No    Disease Pathophysiology Explored patient's options for treatment of their diabetes    Healthy Eating Role of diet in the treatment of diabetes and the relationship between the three main macronutrients and blood glucose level;Food label reading, portion sizes and  measuring food.;Reviewed blood glucose goals for pre and post meals and how to evaluate the patients' food intake on their blood glucose level.;Meal timing in regards to the patients' current diabetes medication.    Being Active Role of exercise on diabetes management, blood pressure control and cardiac health.    Medications Reviewed patients medication for diabetes, action, purpose, timing of dose and side effects.    Monitoring Purpose and frequency of SMBG.;Taught/discussed recording of test results and interpretation of SMBG.;Identified appropriate SMBG and/or A1C goals.    Acute complications Other (comment)    Chronic complications Relationship between chronic complications and blood glucose control    Diabetes Stress and Support Identified and addressed patients feelings and concerns about diabetes      Individualized Goals (developed by patient)   Reducing Risk Other (comment)   improve blood sugars, decrease medications, prevent diabetes complications, lose weight, lead a healthier lifestyle, become more fit     Outcomes   Expected Outcomes Demonstrated interest in learning. Expect positive outcomes    Future DMSE 2 wks         Individualized Plan for Diabetes Self-Management Training:   Learning Objective:  Patient will have a greater understanding of diabetes self-management. Patient education plan is to attend individual and/or group sessions per assessed needs and concerns.   Plan:   Patient Instructions  Check blood sugars before breakfast or 2 hrs after one meal every day Bring blood sugar records to the next class  Exercise: Continue walking  for    10-15  minutes   5-6   days a week  Eat 3 meals day,   1  snack a day Space meals 4-6 hours apart Limit desserts/sweets (1-2 x week) Don't skip meals - eat at least 1 protein and 1 carbohydrate serving  Make an eye doctor appointment  Return for classes on:    Expected Outcomes:  Demonstrated interest in learning.  Expect positive outcomes  Education material provided:  General Meal Planning Guidelines Simple Meal Plan  If problems or questions, patient to contact team via:   Johny Drilling, RN, [place] [phone]  Future DSME appointment: 2 wks March 20, 2022 for Diabetes Class 1

## 2022-03-03 NOTE — Patient Instructions (Signed)
Check blood sugars before breakfast or 2 hrs after one meal every day Bring blood sugar records to the next class  Exercise: Continue walking  for    10-15  minutes   5-6   days a week  Eat 3 meals day,   1  snack a day Space meals 4-6 hours apart Limit desserts/sweets (1-2 x week) Don't skip meals - eat at least 1 protein and 1 carbohydrate serving  Make an eye doctor appointment  Return for classes on:

## 2022-03-20 ENCOUNTER — Encounter: Payer: Medicare HMO | Attending: Family Medicine | Admitting: Dietician

## 2022-03-20 ENCOUNTER — Encounter: Payer: Self-pay | Admitting: Dietician

## 2022-03-20 VITALS — Ht 70.0 in | Wt 258.1 lb

## 2022-03-20 DIAGNOSIS — E119 Type 2 diabetes mellitus without complications: Secondary | ICD-10-CM | POA: Diagnosis not present

## 2022-03-20 NOTE — Progress Notes (Signed)

## 2022-03-27 ENCOUNTER — Encounter: Payer: Self-pay | Admitting: *Deleted

## 2022-03-27 ENCOUNTER — Encounter: Payer: Medicare HMO | Admitting: *Deleted

## 2022-03-27 VITALS — Wt 258.4 lb

## 2022-03-27 DIAGNOSIS — E119 Type 2 diabetes mellitus without complications: Secondary | ICD-10-CM

## 2022-03-27 NOTE — Progress Notes (Signed)

## 2022-04-03 ENCOUNTER — Encounter: Payer: Self-pay | Admitting: *Deleted

## 2022-04-03 ENCOUNTER — Encounter: Payer: Medicare HMO | Admitting: *Deleted

## 2022-04-03 VITALS — BP 140/80 | Wt 260.5 lb

## 2022-04-03 DIAGNOSIS — E1121 Type 2 diabetes mellitus with diabetic nephropathy: Secondary | ICD-10-CM | POA: Diagnosis not present

## 2022-04-03 DIAGNOSIS — E119 Type 2 diabetes mellitus without complications: Secondary | ICD-10-CM

## 2022-04-03 DIAGNOSIS — Z96651 Presence of right artificial knee joint: Secondary | ICD-10-CM | POA: Diagnosis not present

## 2022-04-03 NOTE — Progress Notes (Signed)

## 2022-04-09 DIAGNOSIS — Z800 Family history of malignant neoplasm of digestive organs: Secondary | ICD-10-CM | POA: Diagnosis not present

## 2022-04-09 DIAGNOSIS — K219 Gastro-esophageal reflux disease without esophagitis: Secondary | ICD-10-CM | POA: Diagnosis not present

## 2022-04-09 DIAGNOSIS — Z86010 Personal history of colonic polyps: Secondary | ICD-10-CM | POA: Diagnosis not present

## 2022-04-09 DIAGNOSIS — D509 Iron deficiency anemia, unspecified: Secondary | ICD-10-CM | POA: Diagnosis not present

## 2022-04-10 ENCOUNTER — Encounter: Payer: Self-pay | Admitting: *Deleted

## 2022-04-23 ENCOUNTER — Other Ambulatory Visit: Payer: Self-pay | Admitting: Family Medicine

## 2022-04-23 DIAGNOSIS — K219 Gastro-esophageal reflux disease without esophagitis: Secondary | ICD-10-CM

## 2022-04-29 ENCOUNTER — Other Ambulatory Visit: Payer: Self-pay

## 2022-04-29 DIAGNOSIS — E1159 Type 2 diabetes mellitus with other circulatory complications: Secondary | ICD-10-CM

## 2022-04-29 DIAGNOSIS — E1121 Type 2 diabetes mellitus with diabetic nephropathy: Secondary | ICD-10-CM

## 2022-04-29 DIAGNOSIS — E785 Hyperlipidemia, unspecified: Secondary | ICD-10-CM

## 2022-04-29 DIAGNOSIS — I10 Essential (primary) hypertension: Secondary | ICD-10-CM

## 2022-04-29 MED ORDER — ATORVASTATIN CALCIUM 40 MG PO TABS
40.0000 mg | ORAL_TABLET | Freq: Every day | ORAL | 0 refills | Status: DC
Start: 2022-04-29 — End: 2022-05-21

## 2022-04-29 MED ORDER — ATENOLOL-CHLORTHALIDONE 50-25 MG PO TABS: 1.0000 | ORAL_TABLET | Freq: Every day | ORAL | 0 refills | Status: DC

## 2022-04-29 MED ORDER — GLIPIZIDE ER 2.5 MG PO TB24: 2.5000 mg | ORAL_TABLET | Freq: Every day | ORAL | 0 refills | Status: DC

## 2022-04-29 MED ORDER — OLMESARTAN MEDOXOMIL 20 MG PO TABS: 20.0000 mg | ORAL_TABLET | Freq: Every day | ORAL | 0 refills | Status: DC

## 2022-05-01 DIAGNOSIS — L814 Other melanin hyperpigmentation: Secondary | ICD-10-CM | POA: Diagnosis not present

## 2022-05-01 DIAGNOSIS — Z85828 Personal history of other malignant neoplasm of skin: Secondary | ICD-10-CM | POA: Diagnosis not present

## 2022-05-01 DIAGNOSIS — L570 Actinic keratosis: Secondary | ICD-10-CM | POA: Diagnosis not present

## 2022-05-20 NOTE — Progress Notes (Unsigned)
Name: Michelle Montgomery   MRN: [id number]    DOB: 01/04/1951   Date:05/20/2022       Progress Note  Subjective  Chief Complaint  Follow Up  HPI  DMII with nephropathy: A1C spiked to  9.3 % April 2021 , she was able to changed her diet, was more compliant with metformin and pioglitazone and low dose Glipizide. A1C was 7.9% down to 6.9 % after that  7.9 % and down to 7.1 % , 7.6 % and a few days ago it was up to 7.8%  She states glucose fasting has been around 120's   She is on statin therapy for dyslipidemia and also on ACE for microalbuminuria, HTN is under control.     HTN: BP is at goal, taking Tenoretic, Norvasc and Benazepril ,  she denies chest pain, palpitation, dizziness or SOB., she states long history of dry coughing spells, going on for years, we will try changing from ACE to ARB . BP is towards low end of normal but denies dizziness.    Hyperlipidemia: taking Atorvastatin, she denies myalgia. Reviewed last labs, LDL is still at goal, however triglycerides was up and HDL was low    Insomnia: she is now off Ambien, taking melatonin and Tylenol Pm- discussed risk of Tylenol pm , and sleeps well most of the time. She tried Trazodone but did not help. 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 She saw ENT and he suggested MRI brain but she decided to hold off  . She states mother, maternal grandmother, maternal aunt and cousin have the same problems  Unchanged    GERD: taking Omeprazole, and no heartburn or regurgitation noticed.  She is doing well, she states when she skips medication for 3 days symptoms returns. Unchanged   OA: she has a total knee replacement 04/05/2021 and left side on 07/03, she is doing better    Osteopenia: repeat bone density done 08/2021 showed Frax slightly worse at 9.7 T and risk of hip fracture of 1.5 % , continue high dose  calcium and vitamin D , plus physical activity . Stable   Iron deficiency anemia: she is due for colonoscopy in 2024 and we will place a referral now   Patient Active Problem List   Diagnosis Date Noted   Hypertension associated with type 2 diabetes mellitus 10/17/2021   Osteopenia after menopause 06/18/2021   Total knee replacement status 04/05/2021   Primary osteoarthritis of left knee 03/11/2021   Varicose veins of both lower extremities 06/12/2016   BPV (benign positional vertigo), right 08/24/2015   Smell disturbance 01/16/2015   Osteoarthritis 11/03/2014   Cramps of lower extremity 11/03/2014   Neuralgia neuritis, sciatic nerve 11/03/2014   Insomnia, persistent 11/03/2014   Dyslipidemia 11/03/2014   Type 2 diabetes with nephropathy 11/03/2014   Essential (primary) hypertension 11/03/2014   History of colon polyps 11/03/2014   H/O malignant neoplasm of skin 11/03/2014   Microalbuminuria 11/03/2014   Plantar fasciitis 11/03/2014   Gastro-esophageal reflux disease without esophagitis 11/03/2014   Bundle branch block, right 11/03/2014    Past Surgical History:  Procedure Laterality Date   achiles tendon Left    BREAST BIOPSY Left 2015   benign   FOOT SURGERY Left 08/29/2011   Spur Removal , and Achilles Tendon Tendolysis   KNEE ARTHROPLASTY Right 04/05/2021   Procedure:  COMPUTER ASSISTED TOTAL KNEE ARTHROPLASTY;  Surgeon: Donato Heinz, MD;  Location: [facility] OR[facility];  Service: Orthopedics;  Laterality: Right;   KNEE ARTHROPLASTY Left 08/28/2021   Procedure: COMPUTER ASSISTED TOTAL KNEE ARTHROPLASTY;  Surgeon: Donato Heinz, MD;  Location: [facility] OR[facility];  Service: Orthopedics;  Laterality: Left;   VARICOSE VEIN SURGERY Bilateral     Family History  Problem Relation Age of Onset   Cancer Mother        Colon and breast   Anemia Mother    Hypertension Mother    Breast cancer Mother    Cancer Father        Esophageal   Heart disease Father    Diabetes Brother    Cancer  Maternal Uncle        Colon   Cancer Maternal Grandmother        Colon   Healthy Brother    Melanoma Brother     Social History   Tobacco Use   Smoking status: Former    Packs/day: 0.25    Years: 2.00    Additional pack years: 0.00    Total pack years: 0.50    Types: Cigarettes    Quit date: 02/10/1970    Years since quitting: 52.3   Smokeless tobacco: Never   Tobacco comments:    smoking cessation materials not required  Substance Use Topics   Alcohol use: No    Alcohol/week: 0.0 standard drinks of alcohol     Current Outpatient Medications:    acetaminophen (TYLENOL) 500 MG tablet, Take 500 mg by mouth every 6 (six) hours as needed., Disp: , Rfl:    atenolol-chlorthalidone (TENORETIC) 50-25 MG tablet, Take 1 tablet by mouth daily., Disp: 90 tablet, Rfl: 0   atorvastatin (LIPITOR) 40 MG tablet, Take 1 tablet (40 mg total) by mouth daily., Disp: 90 tablet, Rfl: 0   blood glucose meter kit and supplies, Dispense based on patient and insurance preference. Use up to four times daily as directed. (FOR ICD-10 E10.9, E11.9)., Disp: 1 each, Rfl: 0   Calcium Carb-Cholecalciferol (CALCIUM 600 + D PO), Take 1 tablet by mouth daily., Disp: , Rfl:    Calcium Polycarbophil (FIBER-CAPS PO), Take 1 capsule by mouth daily., Disp: , Rfl:    cetirizine (ZYRTEC) 10 MG tablet, Take 10 mg by mouth daily as needed for allergies., Disp: , Rfl:    diphenhydramine-acetaminophen (TYLENOL PM) 25-500 MG TABS tablet, Take 1 tablet by mouth at bedtime as needed., Disp: , Rfl:    glipiZIDE (GLUCOTROL XL) 2.5 MG 24 hr tablet, Take 1 tablet (2.5 mg total) by mouth daily with breakfast., Disp: 90 tablet, Rfl: 0   glucose blood (ONETOUCH VERIO) test strip, 1 each by Other route in the morning and at bedtime. Use as instructed, Disp: 200 each, Rfl: 5   metFORMIN (GLUCOPHAGE-XR) 750 MG 24 hr tablet, Take 1 tablet (750 mg total) by mouth 2 (two) times daily., Disp: 180 tablet, Rfl: 1   olmesartan (BENICAR) 20 MG  tablet, Take 1 tablet (20 mg total) by mouth daily. In place of benazepril, Disp: 90 tablet, Rfl: 0   omeprazole (PRILOSEC) 20 MG capsule, TAKE 1 CAPSULE DAILY, Disp: 90 capsule, Rfl: 0   pioglitazone (ACTOS) 30 MG tablet, Take 1 tablet (30 mg total) by mouth daily., Disp: 90 tablet, Rfl: 1   psyllium (REGULOID) 0.52 g capsule, Take 0.52 g by mouth daily., Disp: , Rfl:    QUEtiapine (SEROQUEL) 25 MG tablet, Take 1 tablet (25 mg  total) by mouth at bedtime. (Patient not taking: Reported on 03/03/2022), Disp: 90 tablet, Rfl: 0  Allergies  Allergen Reactions   Codeine Nausea Only    Passed out   Celecoxib Hives   Penicillins Rash    IgE = 17 (WNL) on 04/02/2021    I personally reviewed active problem list, medication list, allergies, family history, social history, health maintenance with the patient/caregiver today.   ROS  ***  Objective  There were no vitals filed for this visit.  There is no height or weight on file to calculate BMI.  Physical Exam ***  No results found for this or any previous visit (from the past 2160 hour(s)).  Diabetic Foot Exam: Diabetic Foot Exam - Simple   No data filed    ***  PHQ2/9:    03/03/2022    1:45 PM 01/16/2022    9:30 AM 01/14/2022   11:42 AM 10/17/2021    9:26 AM 06/18/2021    9:00 AM  Depression screen PHQ 2/9  Decreased Interest 0 0 0 0 0  Down, Depressed, Hopeless 0 0 0 0 0  PHQ - 2 Score 0 0 0 0 0  Altered sleeping  0 0 0 0  Tired, decreased energy  0 0 0 0  Change in appetite  0 0 0 0  Feeling bad or failure about yourself   0 0 0 0  Trouble concentrating  0 0 0 0  Moving slowly or fidgety/restless  0 0 0 0  Suicidal thoughts  0 0 0 0  PHQ-9 Score  0 0 0 0    phq 9 is {gen pos [id number]}   Fall Risk:    04/03/2022   12:19 PM 03/27/2022   10:23 AM 03/20/2022    9:23 AM 03/03/2022    1:44 PM 01/16/2022    9:30 AM  Fall Risk   Falls in the past year? 0 0 0 1 1  Number falls in past yr:    0 0  Injury with Fall?    1 1   Risk for fall due to :     No Fall Risks  Follow up     Falls prevention discussed      Functional Status Survey:      Assessment & Plan  *** There are no diagnoses linked to this encounter.

## 2022-05-21 ENCOUNTER — Encounter: Payer: Self-pay | Admitting: Family Medicine

## 2022-05-21 ENCOUNTER — Ambulatory Visit (INDEPENDENT_AMBULATORY_CARE_PROVIDER_SITE_OTHER): Payer: Medicare HMO | Admitting: Family Medicine

## 2022-05-21 VITALS — BP 124/68 | HR 66 | Resp 16 | Ht 70.0 in | Wt 258.0 lb

## 2022-05-21 DIAGNOSIS — Z96653 Presence of artificial knee joint, bilateral: Secondary | ICD-10-CM

## 2022-05-21 DIAGNOSIS — K219 Gastro-esophageal reflux disease without esophagitis: Secondary | ICD-10-CM

## 2022-05-21 DIAGNOSIS — E785 Hyperlipidemia, unspecified: Secondary | ICD-10-CM

## 2022-05-21 DIAGNOSIS — I152 Hypertension secondary to endocrine disorders: Secondary | ICD-10-CM

## 2022-05-21 DIAGNOSIS — E1159 Type 2 diabetes mellitus with other circulatory complications: Secondary | ICD-10-CM | POA: Diagnosis not present

## 2022-05-21 DIAGNOSIS — M8580 Other specified disorders of bone density and structure, unspecified site: Secondary | ICD-10-CM | POA: Diagnosis not present

## 2022-05-21 DIAGNOSIS — E1121 Type 2 diabetes mellitus with diabetic nephropathy: Secondary | ICD-10-CM

## 2022-05-21 DIAGNOSIS — I10 Essential (primary) hypertension: Secondary | ICD-10-CM

## 2022-05-21 DIAGNOSIS — Z780 Asymptomatic menopausal state: Secondary | ICD-10-CM

## 2022-05-21 DIAGNOSIS — G4700 Insomnia, unspecified: Secondary | ICD-10-CM

## 2022-05-21 LAB — POCT GLYCOSYLATED HEMOGLOBIN (HGB A1C): Hemoglobin A1C: 7.6 % — AB (ref 4.0–5.6)

## 2022-05-21 MED ORDER — METFORMIN HCL ER 750 MG PO TB24: 750.0000 mg | ORAL_TABLET | Freq: Two times a day (BID) | ORAL | 0 refills | Status: DC

## 2022-05-21 MED ORDER — ATENOLOL-CHLORTHALIDONE 50-25 MG PO TABS
1.0000 | ORAL_TABLET | Freq: Every day | ORAL | 0 refills | Status: DC
Start: 2022-05-21 — End: 2022-09-22

## 2022-05-21 MED ORDER — OMEPRAZOLE 20 MG PO CPDR: 20.0000 mg | DELAYED_RELEASE_CAPSULE | Freq: Every day | ORAL | 1 refills | Status: DC

## 2022-05-21 MED ORDER — GLIPIZIDE ER 2.5 MG PO TB24: 2.5000 mg | ORAL_TABLET | Freq: Every day | ORAL | 0 refills | Status: DC

## 2022-05-21 MED ORDER — OLMESARTAN MEDOXOMIL 20 MG PO TABS
20.0000 mg | ORAL_TABLET | Freq: Every day | ORAL | 0 refills | Status: DC
Start: 2022-05-21 — End: 2022-09-22

## 2022-05-21 MED ORDER — ATORVASTATIN CALCIUM 40 MG PO TABS: 40.0000 mg | ORAL_TABLET | Freq: Every day | ORAL | 0 refills | Status: DC

## 2022-05-21 MED ORDER — PIOGLITAZONE HCL 30 MG PO TABS: 30.0000 mg | ORAL_TABLET | Freq: Every day | ORAL | 1 refills | Status: DC

## 2022-05-27 ENCOUNTER — Ambulatory Visit: Payer: Medicare HMO | Admitting: Family Medicine

## 2022-05-27 ENCOUNTER — Ambulatory Visit (INDEPENDENT_AMBULATORY_CARE_PROVIDER_SITE_OTHER): Payer: Medicare HMO | Admitting: Nurse Practitioner

## 2022-05-27 ENCOUNTER — Ambulatory Visit: Payer: Self-pay | Admitting: *Deleted

## 2022-05-27 ENCOUNTER — Encounter: Payer: Self-pay | Admitting: Nurse Practitioner

## 2022-05-27 VITALS — BP 122/68 | HR 74 | Temp 97.6°F | Resp 18 | Ht 70.0 in | Wt 258.8 lb

## 2022-05-27 DIAGNOSIS — R300 Dysuria: Secondary | ICD-10-CM

## 2022-05-27 DIAGNOSIS — M545 Low back pain, unspecified: Secondary | ICD-10-CM | POA: Diagnosis not present

## 2022-05-27 LAB — POCT URINALYSIS DIPSTICK
Bilirubin, UA: NEGATIVE
Blood, UA: POSITIVE
Glucose, UA: NEGATIVE
Ketones, UA: NEGATIVE
Nitrite, UA: NEGATIVE
Odor: NORMAL
Protein, UA: NEGATIVE
Spec Grav, UA: 1.02 (ref 1.010–1.025)
Urobilinogen, UA: 0.2 E.U./dL
pH, UA: 6 (ref 5.0–8.0)

## 2022-05-27 MED ORDER — NITROFURANTOIN MONOHYD MACRO 100 MG PO CAPS
100.0000 mg | ORAL_CAPSULE | Freq: Two times a day (BID) | ORAL | 0 refills | Status: DC
Start: 2022-05-27 — End: 2022-09-22

## 2022-05-27 NOTE — Telephone Encounter (Signed)
Summary: back pain/burning w/urination   Patient says her back is hurting, she was up all night going to the bathroom but not voiding much. She also says she has a little burning with urination. Patient wants to know if she can come in and give a sample for a medication for possible UTI     Reason for Disposition  Age > 50 years  Answer Assessment - Initial Assessment Questions 1. SEVERITY: "How bad is the pain?"  (e.g., Scale 1-10; mild, moderate, or severe)   - MILD (1-3): complains slightly about urination hurting   - MODERATE (4-7): interferes with normal activities     - SEVERE (8-10): excruciating, unwilling or unable to urinate because of the pain      Burning- moderate 2. FREQUENCY: "How many times have you had painful urination today?"      every time 3. PATTERN: "Is pain present every time you urinate or just sometimes?"      Every time 4. ONSET: "When did the painful urination start?"      Few days- pain last night 5. FEVER: "Do you have a fever?" If Yes, ask: "What is your temperature, how was it measured, and when did it start?"     no 6. PAST UTI: "Have you had a urine infection before?" If Yes, ask: "When was the last time?" and "What happened that time?"      no 7. CAUSE: "What do you think is causing the painful urination?"  (e.g., UTI, scratch, Herpes sore)     UTI 8. OTHER SYMPTOMS: "Do you have any other symptoms?" (e.g., blood in urine, flank pain, genital sores, urgency, vaginal discharge)     Flank pain, feels like not empty- going small amount  Protocols used: Urination Pain - Female-A-AH

## 2022-05-27 NOTE — Progress Notes (Signed)
BP 122/68   Pulse 74   Temp 97.6 F (36.4 C)   Resp 18   Ht  (1.778 m)   Wt 258 lb 12.8 oz (117.4 kg)   SpO2 94%   BMI 37.13 kg/m    Subjective:    Patient ID: Michelle Montgomery, female    DOB: 11-Apr-1950, 72 y.o.   MRN: [id number]  HPI: Michelle Montgomery is a 72 y.o. female  Chief Complaint  Patient presents with   Urinary Tract Infection    Burning and back pain x 1 day   Urinary frequency/dysuria: she reports that she has had urinary frequency and dysuria since yesterday. She reports she has also had some back pain but has been moving many objects and thinks it might be from that.  She denies any fever.  She denies any new sexual contacts or vaginal discharge.  Urine dip shows positive large leukocytes.  Urine sent for culture.  Start patient on Macrobid.   Relevant past medical, surgical, family and social history reviewed and updated as indicated. Interim medical history since our last visit reviewed. Allergies and medications reviewed and updated.  Review of Systems  Constitutional: Negative for fever or weight change.  Respiratory: Negative for cough and shortness of breath.   Cardiovascular: Negative for chest pain or palpitations.  Gastrointestinal: Negative for abdominal pain, no bowel changes.  GU: Positive for dysuria and urinary frequency Musculoskeletal: Negative for gait problem or joint swelling.  Skin: Negative for rash.  Neurological: Negative for dizziness or headache.  No other specific complaints in a complete review of systems (except as listed in HPI above).      Objective:    BP 122/68   Pulse 74   Temp 97.6 F (36.4 C)   Resp 18   Ht  (1.778 m)   Wt 258 lb 12.8 oz (117.4 kg)   SpO2 94%   BMI 37.13 kg/m   Wt Readings from Last 3 Encounters:  05/27/22 258 lb 12.8 oz (117.4 kg)  05/21/22 258 lb (117 kg)  04/03/22 260 lb 8 oz (118.2 kg)    Physical Exam  Constitutional: Patient appears well-developed and well-nourished.  Obese  No distress.  HEENT: head atraumatic, normocephalic, pupils equal and reactive to light, neck supple Cardiovascular: Normal rate, regular rhythm and normal heart sounds.  No murmur heard. No BLE edema. Pulmonary/Chest: Effort normal and breath sounds normal. No respiratory distress. Abdominal: Soft.  There is no tenderness. No CVA tenderness Psychiatric: Patient has a normal mood and affect. behavior is normal. Judgment and thought content normal.  Results for orders placed or performed in visit on 05/27/22  POCT urinalysis dipstick  Result Value Ref Range   Color, UA yellow    Clarity, UA cloudy    Glucose, UA Negative Negative   Bilirubin, UA neg    Ketones, UA neg    Spec Grav, UA 1.020 1.010 - 1.025   Blood, UA pos    pH, UA 6.0 5.0 - 8.0   Protein, UA Negative Negative   Urobilinogen, UA 0.2 0.2 or 1.0 E.U./dL   Nitrite, UA neg    Leukocytes, UA Large (3+) (A) Negative   Appearance cloudy    Odor normal       Assessment & Plan:   Problem List Items Addressed This Visit   None Visit Diagnoses     Burning with urination    -  Primary   Start Macrobid urine sent for culture push  fluids   Relevant Medications   nitrofurantoin, macrocrystal-monohydrate, (MACROBID) 100 MG capsule   Other Relevant Orders   POCT urinalysis dipstick (Completed)   Urine Culture        Follow up plan: Return if symptoms worsen or fail to improve.

## 2022-05-27 NOTE — Telephone Encounter (Signed)
  Chief Complaint: urinary pain, frequency Symptoms: pain, frequency, flank pain Frequency: started a few days ago- pain started last night Pertinent Negatives: Patient denies fever Disposition: ED /[x] Urgent Care (no appt availability in office) / Appointment(In office/virtual)/  [facility]/ Home Care/ Refused Recommended Disposition /[]  Mobile Bus/  Follow-up with PCP Additional Notes: Call to office- no open appointment- advised UC- patient advised UC she will go

## 2022-05-29 LAB — URINE CULTURE
MICRO NUMBER:: 14835288
SPECIMEN QUALITY:: ADEQUATE

## 2022-06-10 ENCOUNTER — Ambulatory Visit: Payer: Self-pay | Admitting: *Deleted

## 2022-06-10 ENCOUNTER — Telehealth: Payer: Self-pay

## 2022-06-10 NOTE — Telephone Encounter (Signed)
Summary: Per agent: "Pt requests call back to advise what she can take to help with the symptoms of possible food poison   Pt reports that she thinks that she has food poisoning and she is still dealing with a kidney infection. Pt declined appt and requests call back to advise what she can take to help with the symptoms." Cb# [phone]         Chief Complaint: Urinary urgency, nausea Symptoms: Pt was on Macrobid 05/27/22 for UTI. States felt better for a while, now with chills, sweating, nausea , "I just stand up and pee, can't make it to the bathroom." Reports thinks she has food poisoning from [employer] dressing  Sat and Sun. "And that's why I have chills and sweating and body aches."  Frequency: Saturday Pertinent Negatives: Patient denies vomiting, diarrhea Disposition: [] ED /[] Urgent Care (no appt availability in office) / [] Appointment(In office/virtual)/ []  [facility]/ [] Home Care/ [x] Refused Recommended Disposition /[] [facility]/ []  Follow-up with PCP Additional Notes: Pt is evasive historian. Subjective fever "I can't read the thermometer, too small of digits."  States husband was sick too after eating,with chills, "Saturday but not Sunday." Pt sounds distressed. Advised will need to be seen, "I can't get out of bed and get dressed." Advised UC, declines. Offered virtual but would need to come in or specimen if needed. Pt states "Just see if Dr. Carlynn Purl can give me something for nausea."  Reason for Disposition  [1] Can't control passage of urine (i.e., urinary incontinence) AND [2] new-onset (< 2 weeks) or worsening  Answer Assessment - Initial Assessment Questions 1. SYMPTOM: "What's the main symptom you're concerned about?" (e.g., frequency, incontinence)     Urgency, incontinence 2. ONSET: "When did the    start?"     05/27/22, felt better 3. PAIN: "Is there any pain?" If Yes, ask: "How bad is it?" (Scale: 1-10; mild, moderate, severe)      4. CAUSE:  "What do you think is causing the symptoms?"     Chills 5. OTHER SYMPTOMS: "Do you have any other symptoms?" (e.g., blood in urine, fever, flank pain, pain with urination)  Protocols used: Urinary Symptoms-A-AH

## 2022-06-10 NOTE — Telephone Encounter (Signed)
Spoke with patient and relayed advice per Dr. Carlynn Purl. Patient stated she was feeling to bad to come in for evaluation. Patient was encouraged to seek emergency treatment to which she declined and stated if she got to feeling better she would call back and make an appointment. Patient was advised to stay hydrated and monitor her blood glucose levels at least. She expressed understanding and implied she would comply. Will call her back tomorrow to check on her.

## 2022-06-10 NOTE — Telephone Encounter (Signed)
Spoke with patient and relayed advice per Dr. Sowles. Patient stated she was feeling to bad to come in for evaluation. Patient was encouraged to seek emergency treatment to which she declined and stated if she got to feeling better she would call back and make an appointment. Patient was advised to stay hydrated and monitor her blood glucose levels at least. She expressed understanding and implied she would comply. Will call her back tomorrow to check on her.  

## 2022-07-15 ENCOUNTER — Other Ambulatory Visit: Payer: Self-pay | Admitting: Family Medicine

## 2022-07-15 DIAGNOSIS — I10 Essential (primary) hypertension: Secondary | ICD-10-CM

## 2022-07-21 NOTE — Progress Notes (Unsigned)
There were no vitals taken for this visit.   Subjective:    Patient ID: Michelle Montgomery, female    DOB: 21-Dec-1950, 72 y.o.   MRN: [id number]  HPI: Michelle Montgomery is a 72 y.o. female  No chief complaint on file.    Relevant past medical, surgical, family and social history reviewed and updated as indicated. Interim medical history since our last visit reviewed. Allergies and medications reviewed and updated.  Review of Systems  Constitutional: Negative for fever or weight change.  Respiratory: Negative for cough and shortness of breath.   Cardiovascular: Negative for chest pain or palpitations.  Gastrointestinal: Negative for abdominal pain, no bowel changes.  Musculoskeletal: Negative for gait problem or joint swelling.  Skin: Negative for rash.  Neurological: Negative for dizziness or headache.  No other specific complaints in a complete review of systems (except as listed in HPI above).      Objective:    There were no vitals taken for this visit.  Wt Readings from Last 3 Encounters:  05/27/22 258 lb 12.8 oz (117.4 kg)  05/21/22 258 lb (117 kg)  04/03/22 260 lb 8 oz (118.2 kg)    Physical Exam  Constitutional: Patient appears well-developed and well-nourished. Obese *** No distress.  HEENT: head atraumatic, normocephalic, pupils equal and reactive to light, ears ***, neck supple, throat within normal limits Cardiovascular: Normal rate, regular rhythm and normal heart sounds.  No murmur heard. No BLE edema. Pulmonary/Chest: Effort normal and breath sounds normal. No respiratory distress. Abdominal: Soft.  There is no tenderness. Psychiatric: Patient has a normal mood and affect. behavior is normal. Judgment and thought content normal.  Results for orders placed or performed in visit on 05/27/22  Urine Culture   Specimen: Urine  Result Value Ref Range   MICRO NUMBER: [id number]    SPECIMEN QUALITY: Adequate    Sample Source URINE    STATUS: FINAL    ISOLATE  1: Escherichia coli (A)       Susceptibility   Escherichia coli - URINE CULTURE, REFLEX    AMOX/CLAVULANIC 8 Sensitive     AMPICILLIN >=32 Resistant     AMPICILLIN/SULBACTAM >=32 Resistant     CEFAZOLIN* <=4 Not Reportable      * For infections other than uncomplicated UTI caused by E. coli, K. pneumoniae or P. mirabilis: Cefazolin is resistant if MIC > or = 8 mcg/mL. (Distinguishing susceptible versus intermediate for isolates with MIC < or = 4 mcg/mL requires additional testing.) For uncomplicated UTI caused by E. coli, K. pneumoniae or P. mirabilis: Cefazolin is susceptible if MIC <32 mcg/mL and predicts susceptible to the oral agents cefaclor, cefdinir, cefpodoxime, cefprozil, cefuroxime, cephalexin and loracarbef.     CEFTAZIDIME <=1 Sensitive     CEFEPIME <=1 Sensitive     CEFTRIAXONE <=1 Sensitive     CIPROFLOXACIN <=0.25 Sensitive     LEVOFLOXACIN <=0.12 Sensitive     GENTAMICIN <=1 Sensitive     IMIPENEM <=0.25 Sensitive     NITROFURANTOIN <=16 Sensitive     PIP/TAZO <=4 Sensitive     TOBRAMYCIN <=1 Sensitive     TRIMETH/SULFA* <=20 Sensitive      * For infections other than uncomplicated UTI caused by E. coli, K. pneumoniae or P. mirabilis: Cefazolin is resistant if MIC > or = 8 mcg/mL. (Distinguishing susceptible versus intermediate for isolates with MIC < or = 4 mcg/mL requires additional testing.) For uncomplicated UTI caused by E. coli, K. pneumoniae or P. mirabilis: Cefazolin  is susceptible if MIC <32 mcg/mL and predicts susceptible to the oral agents cefaclor, cefdinir, cefpodoxime, cefprozil, cefuroxime, cephalexin and loracarbef. Legend: S = Susceptible  I = Intermediate R = Resistant  NS = Not susceptible * = Not tested  NR = Not reported **NN = See antimicrobic comments   POCT urinalysis dipstick  Result Value Ref Range   Color, UA yellow    Clarity, UA cloudy    Glucose, UA Negative Negative   Bilirubin, UA neg    Ketones, UA neg    Spec  Grav, UA 1.020 1.010 - 1.025   Blood, UA pos    pH, UA 6.0 5.0 - 8.0   Protein, UA Negative Negative   Urobilinogen, UA 0.2 0.2 or 1.0 E.U./dL   Nitrite, UA neg    Leukocytes, UA Large (3+) (A) Negative   Appearance cloudy    Odor normal       Assessment & Plan:   Problem List Items Addressed This Visit   None    Follow up plan: No follow-ups on file.

## 2022-07-22 ENCOUNTER — Ambulatory Visit (INDEPENDENT_AMBULATORY_CARE_PROVIDER_SITE_OTHER): Payer: Medicare HMO | Admitting: Nurse Practitioner

## 2022-07-22 ENCOUNTER — Encounter: Payer: Self-pay | Admitting: Nurse Practitioner

## 2022-07-22 ENCOUNTER — Other Ambulatory Visit: Payer: Self-pay

## 2022-07-22 VITALS — BP 128/72 | HR 92 | Temp 98.1°F | Resp 16 | Ht 70.0 in | Wt 255.5 lb

## 2022-07-22 DIAGNOSIS — R300 Dysuria: Secondary | ICD-10-CM

## 2022-07-22 LAB — POCT URINALYSIS DIPSTICK
Bilirubin, UA: NEGATIVE
Glucose, UA: NEGATIVE
Ketones, UA: NEGATIVE
Nitrite, UA: POSITIVE
Protein, UA: POSITIVE — AB
Spec Grav, UA: 1.025 (ref 1.010–1.025)
Urobilinogen, UA: 0.2 E.U./dL
pH, UA: 6 (ref 5.0–8.0)

## 2022-07-22 MED ORDER — CIPROFLOXACIN HCL 250 MG PO TABS
250.0000 mg | ORAL_TABLET | Freq: Two times a day (BID) | ORAL | 0 refills | Status: AC
Start: 2022-07-22 — End: 2022-07-25

## 2022-07-24 LAB — URINE CULTURE
MICRO NUMBER:: 15069461
SPECIMEN QUALITY:: ADEQUATE

## 2022-08-04 ENCOUNTER — Ambulatory Visit (INDEPENDENT_AMBULATORY_CARE_PROVIDER_SITE_OTHER): Payer: Medicare HMO | Admitting: Physician Assistant

## 2022-08-04 VITALS — BP 124/68 | HR 65 | Temp 100.4°F | Resp 18 | Ht 70.0 in | Wt 255.8 lb

## 2022-08-04 DIAGNOSIS — R051 Acute cough: Secondary | ICD-10-CM | POA: Diagnosis not present

## 2022-08-04 DIAGNOSIS — J069 Acute upper respiratory infection, unspecified: Secondary | ICD-10-CM

## 2022-08-04 DIAGNOSIS — J029 Acute pharyngitis, unspecified: Secondary | ICD-10-CM | POA: Diagnosis not present

## 2022-08-04 DIAGNOSIS — R509 Fever, unspecified: Secondary | ICD-10-CM | POA: Diagnosis not present

## 2022-08-04 LAB — POCT RAPID STREP A (OFFICE): Rapid Strep A Screen: NEGATIVE

## 2022-08-04 NOTE — Patient Instructions (Signed)
Based on your described symptoms and the duration of symptoms it is likely that you have a viral upper respiratory infection (often called a "cold")  Symptoms can last for 3-10 days with lingering cough and intermittent symptoms lasting weeks after that.  The goal of treatment at this time is to reduce your symptoms and discomfort   I recommend using Robitussin and Mucinex (regular formulations, nothing with decongestants or DM)  You can also use Tylenol for body aches and fever reduction I also recommend adding an antihistamine to your daily regimen This includes medications like Claritin, Allegra, Zyrtec- the generics of these work very well and are usually less expensive I recommend using Flonase nasal spray - 2 puffs twice per day to help with your nasal congestion The antihistamines and Flonase can take a few weeks to provide significant relief from allergy symptoms but should start to provide some benefit soon. You can use a humidifier at night to help with preventing nasal dryness and irritation   If your symptoms do not improve or become worse in the next 5-7 days please make an apt at the office so we can see you  Go to the ER if you begin to have more serious symptoms such as shortness of breath, trouble breathing, loss of consciousness, swelling around the eyes, high fever, severe lasting headaches, vision changes or neck pain/stiffness.   

## 2022-08-04 NOTE — Progress Notes (Signed)
Acute Office Visit   Patient: Michelle Montgomery   DOB: October 08, 1950   72 y.o. Female  MRN: [id number] Visit Date: 08/04/2022  Today's healthcare provider: Oswaldo Conroy Aarini Slee, PA-C  Introduced myself to the patient as a Secondary school teacher and provided education on APPs in clinical practice.    Chief Complaint  Patient presents with   URI    Sinus w/ sore throat    Subjective    HPI HPI     URI    Additional comments: Sinus w/ sore throat       Last edited by Marcos Eke, CMA on 08/04/2022  2:43 PM.       URI Type symptoms  Onset: sudden  Duration: started Thurs night/ Friday with dry coughing  Associated symptoms: hoarse voice, sore throat, post nasal drainage, productive cough,  Interventions: Mucinex last night, some Tylenol  Recent sick contacts: reports her husband started feeling bad last night with similar symptoms   Recent travel: was at the beach last week with family  COVID testing at home: She has not tested for COVID at home       Medications: Outpatient Medications Prior to Visit  Medication Sig   atenolol-chlorthalidone (TENORETIC) 50-25 MG tablet Take 1 tablet by mouth daily.   atorvastatin (LIPITOR) 40 MG tablet Take 1 tablet (40 mg total) by mouth daily.   blood glucose meter kit and supplies Dispense based on patient and insurance preference. Use up to four times daily as directed. (FOR ICD-10 E10.9, E11.9).   Calcium Carb-Cholecalciferol (CALCIUM 600 + D PO) Take 1 tablet by mouth daily.   Calcium Polycarbophil (FIBER-CAPS PO) Take 1 capsule by mouth daily.   cetirizine (ZYRTEC) 10 MG tablet Take 10 mg by mouth daily as needed for allergies.   ferrous sulfate 325 (65 FE) MG tablet Take 325 mg by mouth daily with breakfast.   glipiZIDE (GLUCOTROL XL) 2.5 MG 24 hr tablet Take 1 tablet (2.5 mg total) by mouth daily with breakfast.   glucose blood (ONETOUCH VERIO) test strip 1 each by Other route in the morning and at bedtime. Use as instructed   metFORMIN  (GLUCOPHAGE-XR) 750 MG 24 hr tablet Take 1 tablet (750 mg total) by mouth 2 (two) times daily.   nitrofurantoin, macrocrystal-monohydrate, (MACROBID) 100 MG capsule Take 1 capsule (100 mg total) by mouth 2 (two) times daily. (Patient not taking: Reported on 07/22/2022)   olmesartan (BENICAR) 20 MG tablet Take 1 tablet (20 mg total) by mouth daily. In place of benazepril   omeprazole (PRILOSEC) 20 MG capsule Take 1 capsule (20 mg total) by mouth daily.   pioglitazone (ACTOS) 30 MG tablet Take 1 tablet (30 mg total) by mouth daily.   psyllium (REGULOID) 0.52 g capsule Take 0.52 g by mouth daily.   No facility-administered medications prior to visit.    Review of Systems  Constitutional:  Positive for fever. Negative for chills and fatigue.  HENT:  Positive for congestion, postnasal drip, rhinorrhea, sinus pressure, sore throat and voice change. Negative for ear pain.   Respiratory:  Positive for cough. Negative for shortness of breath and wheezing.   Gastrointestinal:  Negative for diarrhea, nausea and vomiting.  Musculoskeletal:  Negative for myalgias.  Neurological:  Positive for headaches. Negative for dizziness and light-headedness.       Objective    BP 124/68   Pulse 65   Temp (!) 100.4 F (38 C)   Resp 18  Ht 5\' 10"  (1.778 m)   Wt 255 lb 12.8 oz (116 kg)   SpO2 91%   BMI 36.70 kg/m    Physical Exam Vitals reviewed.  Constitutional:      General: She is awake. She is not in acute distress.    Appearance: Normal appearance. She is well-developed and well-groomed. She is ill-appearing.  HENT:     Head: Normocephalic and atraumatic.     Right Ear: Hearing, tympanic membrane and ear canal normal.     Left Ear: Hearing, tympanic membrane and ear canal normal.     Nose: Nose normal.     Mouth/Throat:     Lips: Pink.     Mouth: Mucous membranes are moist.     Pharynx: Oropharynx is clear. Uvula midline. No oropharyngeal exudate or posterior oropharyngeal erythema.      Tonsils: No tonsillar exudate or tonsillar abscesses.  Eyes:     General: Lids are normal. Gaze aligned appropriately.     Conjunctiva/sclera: Conjunctivae normal.  Cardiovascular:     Rate and Rhythm: Normal rate and regular rhythm.     Pulses: Normal pulses.          Radial pulses are 2+ on the right side and 2+ on the left side.     Heart sounds: Normal heart sounds. No murmur heard.    No friction rub. No gallop.  Pulmonary:     Effort: Pulmonary effort is normal.     Breath sounds: Normal breath sounds. No decreased air movement. No decreased breath sounds, wheezing, rhonchi or rales.  Musculoskeletal:     Cervical back: Normal range of motion and neck supple.  Neurological:     Mental Status: She is alert.  Psychiatric:        Behavior: Behavior is cooperative.       Results for orders placed or performed in visit on 08/04/22  POCT rapid strep A  Result Value Ref Range   Rapid Strep A Screen Negative Negative    Assessment & Plan      No follow-ups on file.     Problem List Items Addressed This Visit   None Visit Diagnoses     Viral upper respiratory tract infection    -  Primary Acute, new concern  Visit with patient indicates symptoms comprised of sore throat, hoarse voice, sinus pressure and pain, congestion, post nasal drainage since Thurs congruent with acute URI that is likely viral in nature  Rapid strep was negative - results reviewed with patient during apt  Due to nature and duration of symptoms recommended treatment regimen is symptomatic relief and follow up if needed Discussed with patient the various viral and bacterial etiologies of current illness and appropriate course of treatment Discussed OTC medication options for multisymptom relief such as Dayquil/Nyquil, Theraflu, AlkaSeltzer, etc. Discussed return precautions if symptoms are not improving or worsen over next 5-7 days.     Acute cough       Relevant Orders   Novel Coronavirus, NAA  ([facility])   Fever, unspecified fever cause       Relevant Orders   POCT rapid strep A (Completed)   Novel Coronavirus, NAA ([facility])   Sore throat       Relevant Orders   POCT rapid strep A (Completed)   Novel Coronavirus, NAA ([facility])        No follow-ups on file.   I, Yanira Tolsma E Rafaella Kole, PA-C, have reviewed all documentation for this visit. The documentation on 08/04/22 for  the exam, diagnosis, procedures, and orders are all accurate and complete.   Jacquelin Hawking, MHS, PA-C [facility]

## 2022-08-06 ENCOUNTER — Ambulatory Visit: Admission: RE | Admit: 2022-08-06 | Payer: Medicare HMO | Source: Home / Self Care | Admitting: Internal Medicine

## 2022-08-06 ENCOUNTER — Encounter: Admission: RE | Payer: Self-pay | Source: Home / Self Care

## 2022-08-06 SURGERY — COLONOSCOPY WITH PROPOFOL
Anesthesia: General

## 2022-08-07 LAB — NOVEL CORONAVIRUS, NAA: SARS-CoV-2, NAA: NOT DETECTED

## 2022-08-08 NOTE — Progress Notes (Signed)
Your COVID and strep testing was negative

## 2022-08-12 ENCOUNTER — Ambulatory Visit: Payer: Self-pay | Admitting: *Deleted

## 2022-08-12 NOTE — Telephone Encounter (Signed)
Summary: medication for possible sinus infection   Patient called stated she was seen last week for sinus issues and she has not gotten any better. She now has green mucus and congestion all in her head. She is requesting an appt but there are no openings for a week or more. Can provider call her in something for the symptoms         Chief Complaint: sinus drainage nasal congestion yellow to green mucus, requesting antibiotics Symptoms: sx not better since OV 08/04/22. Scratchy throat, hoarse voice, nasal congestion blowing nose for dark yellow to green mucus. Difficulty breathing through nose. Has been taking robitussin and mucinex without relief.  Frequency: 2 weeks  Pertinent Negatives: Patient denies chest pain no difficulty breathing. No fever no headache  Disposition: [] ED /[] Urgent Care (no appt availability in office) / [] Appointment(In office/virtual)/ []  [place] [facility]/ [] Home Care/ [x] Refused Recommended Disposition /[] [place] [facility]/ []  Follow-up with PCP Additional Notes:   Recommended  OV or go to UC for worsening sx , none available until July 26. Patient unable to know how to do VV. Last OV 08/04/22 for same issues. Requesting antibiotics and to send to [facility],  [id number] [address] [facility]. Please advise . Last seen by E. Mecum, PA     Reason for Disposition  [1] Nasal discharge AND [2] present > 10 days  Answer Assessment - Initial Assessment Questions 1. LOCATION: "Where does it hurt?"      Nasal congestion 2. ONSET: "When did the sinus pain start?"  (e.g., hours, days)      2 weeks ago  3. SEVERITY: "How bad is the pain?"   (Scale 1-10; mild, moderate or severe)   - MILD (1-3): doesn't interfere with normal activities    - MODERATE (4-7): interferes with normal activities (e.g., work or school) or awakens from sleep   - SEVERE (8-10): excruciating pain and patient unable to do any normal activities        Difficulty breathing through nose.   4. RECURRENT SYMPTOM: "Have you ever had sinus problems before?" If Yes, ask: "When was the last time?" and "What happened that time?"      Yes  5. NASAL CONGESTION: "Is the nose blocked?" If Yes, ask: "Can you open it or must you breathe through your mouth?"     Yes blocked at times and can breath through mouth  6. NASAL DISCHARGE: "Do you have discharge from your nose?" If so ask, "What color?"     Yes dark yellow to green  7. FEVER: "Do you have a fever?" If Yes, ask: "What is it, how was it measured, and when did it start?"      no 8. OTHER SYMPTOMS: "Do you have any other symptoms?" (e.g., sore throat, cough, earache, difficulty breathing)     Scratchy throat , hoarse, sinus drainage green mucus difficulty breathing through nose  9. PREGNANCY: "Is there any chance you are pregnant?" "When was your last menstrual period?"     na  Protocols used: Sinus Pain or Congestion-A-AH

## 2022-08-13 NOTE — Telephone Encounter (Signed)
Called and left voicemail advising patient to seek UC visit or try an E-Visit through MyChart after checking billing/cost information.

## 2022-09-19 NOTE — Progress Notes (Unsigned)
Name: Michelle Montgomery   MRN: [id number]    DOB: 1950-05-29   Date:09/19/2022       Progress Note  Subjective  Chief Complaint  Follow Up  HPI  DMII with nephropathy: A1C spiked to  9.3 % April 2021 , she was able to changed her diet, was more compliant with metformin and pioglitazone and low dose Glipizide. A1C was 7.9% down to 6.9 % after that  7.9 % and down to 7.1 % , 7.6 %, 7.8% and today is down to 7.6 %  She states glucose fasting has been around fasting 130-140  She is on statin therapy for dyslipidemia and also on ACE for microalbuminuria, HTN is under control.     HTN: BP is at goal, taking Tenoretic, Norvasc and Benicar.  She denies chest pain, palpitation, dizziness or SOB. We stopped ACE due to cough but continues to cough.    Hyperlipidemia: taking Atorvastatin, she denies myalgia. Continue current medications    Insomnia: she is now off Ambien, taking melatonin and Tylenol Pm- discussed risk of Tylenol pm , and sleeps well most of the time. She tried Trazodone but did not help. I gave her a rx of Seroquel but she never started due to possible side effects. 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 She saw ENT and he suggested MRI brain but she decided to hold off  . She states mother, maternal grandmother, maternal aunt and cousin have the same problems  Unchanged    GERD: taking Omeprazole, and no heartburn or regurgitation noticed.  She is doing well, she states when she skips medication for 3 days symptoms returns. She continues to have a cough and will have a EGD   OA: she has a total knee replacement 04/05/2021 and left side on 07/03, she is doing better    Osteopenia: repeat bone density done 08/2021 showed Frax slightly worse at 9.7 T and risk of hip fracture of 1.5 % , continue high dose calcium and vitamin D ,  plus physical activity .   Iron deficiency anemia: she is due for colonoscopy in 2024 and already scheduled, she has been taking ferrous sulfate   Patient Active Problem List   Diagnosis Date Noted   Hypertension associated with type 2 diabetes mellitus (HCC) 10/17/2021   Osteopenia after menopause 06/18/2021   Total knee replacement status 04/05/2021   Primary osteoarthritis of left knee 03/11/2021   Varicose veins of both lower extremities 06/12/2016   BPV (benign positional vertigo), right 08/24/2015   Smell disturbance 01/16/2015   Osteoarthritis 11/03/2014   Cramps of lower extremity 11/03/2014   Neuralgia neuritis, sciatic nerve 11/03/2014   Insomnia, persistent 11/03/2014   Dyslipidemia 11/03/2014   Type 2 diabetes with nephropathy (HCC) 11/03/2014   Essential (primary) hypertension 11/03/2014   History of colon polyps 11/03/2014   H/O malignant neoplasm of skin 11/03/2014   Microalbuminuria 11/03/2014   Plantar fasciitis 11/03/2014   Gastro-esophageal reflux disease without esophagitis 11/03/2014   Bundle branch block, right 11/03/2014    Past Surgical History:  Procedure Laterality Date   achiles tendon Left    BREAST BIOPSY Left 2015   benign   FOOT SURGERY Left 08/29/2011   Spur Removal , and Achilles Tendon Tendolysis   KNEE ARTHROPLASTY Right 04/05/2021   Procedure: COMPUTER  ASSISTED TOTAL KNEE ARTHROPLASTY;  Surgeon: Donato Heinz, MD;  Location: [facility] ORS;  Service: Orthopedics;  Laterality: Right;   KNEE ARTHROPLASTY Left 08/28/2021   Procedure: COMPUTER ASSISTED TOTAL KNEE ARTHROPLASTY;  Surgeon: Donato Heinz, MD;  Location: [facility] ORS;  Service: Orthopedics;  Laterality: Left;   VARICOSE VEIN SURGERY Bilateral     Family History  Problem Relation Age of Onset   Cancer Mother        Colon and breast   Anemia Mother    Hypertension Mother    Breast cancer Mother    Cancer Father        Esophageal   Heart disease Father    Diabetes Brother    Cancer  Maternal Uncle        Colon   Cancer Maternal Grandmother        Colon   Healthy Brother    Melanoma Brother     Social History   Tobacco Use   Smoking status: Former    Current packs/day: 0.00    Average packs/day: 0.3 packs/day for 2.0 years (0.5 ttl pk-yrs)    Types: Cigarettes    Start date: 02/11/1968    Quit date: 02/10/1970    Years since quitting: 52.6   Smokeless tobacco: Never   Tobacco comments:    smoking cessation materials not required  Substance Use Topics   Alcohol use: No    Alcohol/week: 0.0 standard drinks of alcohol     Current Outpatient Medications:    atenolol-chlorthalidone (TENORETIC) 50-25 MG tablet, Take 1 tablet by mouth daily., Disp: 90 tablet, Rfl: 0   atorvastatin (LIPITOR) 40 MG tablet, Take 1 tablet (40 mg total) by mouth daily., Disp: 90 tablet, Rfl: 0   blood glucose meter kit and supplies, Dispense based on patient and insurance preference. Use up to four times daily as directed. (FOR ICD-10 E10.9, E11.9)., Disp: 1 each, Rfl: 0   Calcium Carb-Cholecalciferol (CALCIUM 600 + D PO), Take 1 tablet by mouth daily., Disp: , Rfl:    Calcium Polycarbophil (FIBER-CAPS PO), Take 1 capsule by mouth daily., Disp: , Rfl:    cetirizine (ZYRTEC) 10 MG tablet, Take 10 mg by mouth daily as needed for allergies., Disp: , Rfl:    ferrous sulfate 325 (65 FE) MG tablet, Take 325 mg by mouth daily with breakfast., Disp: , Rfl:    glipiZIDE (GLUCOTROL XL) 2.5 MG 24 hr tablet, Take 1 tablet (2.5 mg total) by mouth daily with breakfast., Disp: 90 tablet, Rfl: 0   glucose blood (ONETOUCH VERIO) test strip, 1 each by Other route in the morning and at bedtime. Use as instructed, Disp: 200 each, Rfl: 5   metFORMIN (GLUCOPHAGE-XR) 750 MG 24 hr tablet, Take 1 tablet (750 mg total) by mouth 2 (two) times daily., Disp: 180 tablet, Rfl: 0   nitrofurantoin, macrocrystal-monohydrate, (MACROBID) 100 MG capsule, Take 1 capsule (100 mg total) by mouth 2 (two) times daily. (Patient not  taking: Reported on 07/22/2022), Disp: 10 capsule, Rfl: 0   olmesartan (BENICAR) 20 MG tablet, Take 1 tablet (20 mg total) by mouth daily. In place of benazepril, Disp: 90 tablet, Rfl: 0   omeprazole (PRILOSEC) 20 MG capsule, Take 1 capsule (20 mg total) by mouth daily., Disp: 90 capsule, Rfl: 1   pioglitazone (ACTOS) 30 MG tablet, Take 1 tablet (30 mg total) by mouth daily., Disp: 90 tablet, Rfl: 1   psyllium (REGULOID) 0.52 g capsule, Take 0.52 g by mouth daily., Disp: , Rfl:  Allergies  Allergen Reactions   Codeine Nausea Only    Passed out   Celecoxib Hives   Penicillins Rash    IgE = 17 (WNL) on 04/02/2021    I personally reviewed active problem list, medication list, allergies, family history, social history, health maintenance with the patient/caregiver today.   ROS  ***  Objective  There were no vitals filed for this visit.  There is no height or weight on file to calculate BMI.  Physical Exam ***   PHQ2/9:    08/04/2022    2:43 PM 07/22/2022    1:02 PM 05/27/2022    3:02 PM 05/21/2022    8:55 AM 03/03/2022    1:45 PM  Depression screen PHQ 2/9  Decreased Interest 0 0 0 0 0  Down, Depressed, Hopeless 0  0 0 0  PHQ - 2 Score 0 0 0 0 0  Altered sleeping 0  0 0   Tired, decreased energy 0  0 3   Change in appetite 0  0 0   Feeling bad or failure about yourself  0  0 0   Trouble concentrating 0  0 0   Moving slowly or fidgety/restless 0  0 0   Suicidal thoughts 0  0 0   PHQ-9 Score 0  0 3   Difficult doing work/chores Not difficult at all  Not difficult at all      phq 9 is {gen pos [id number]}   Fall Risk:    08/04/2022    2:43 PM 07/22/2022    1:02 PM 05/27/2022    3:02 PM 05/21/2022    8:55 AM 04/03/2022   12:19 PM  Fall Risk   Falls in the past year? 0 1 1 1  0  Number falls in past yr: 0 1 0 0   Injury with Fall? 0 1 1 1    Risk for fall due to :    No Fall Risks   Follow up    Falls prevention discussed       Functional Status Survey:       Assessment & Plan  *** There are no diagnoses linked to this encounter.

## 2022-09-22 ENCOUNTER — Encounter: Payer: Self-pay | Admitting: Family Medicine

## 2022-09-22 ENCOUNTER — Ambulatory Visit (INDEPENDENT_AMBULATORY_CARE_PROVIDER_SITE_OTHER): Payer: Medicare HMO | Admitting: Family Medicine

## 2022-09-22 VITALS — BP 136/78 | HR 72 | Resp 16 | Ht 70.0 in | Wt 256.0 lb

## 2022-09-22 DIAGNOSIS — E785 Hyperlipidemia, unspecified: Secondary | ICD-10-CM

## 2022-09-22 DIAGNOSIS — G4700 Insomnia, unspecified: Secondary | ICD-10-CM | POA: Diagnosis not present

## 2022-09-22 DIAGNOSIS — Z96653 Presence of artificial knee joint, bilateral: Secondary | ICD-10-CM

## 2022-09-22 DIAGNOSIS — M8580 Other specified disorders of bone density and structure, unspecified site: Secondary | ICD-10-CM | POA: Diagnosis not present

## 2022-09-22 DIAGNOSIS — I152 Hypertension secondary to endocrine disorders: Secondary | ICD-10-CM

## 2022-09-22 DIAGNOSIS — Z7984 Long term (current) use of oral hypoglycemic drugs: Secondary | ICD-10-CM | POA: Diagnosis not present

## 2022-09-22 DIAGNOSIS — Z1231 Encounter for screening mammogram for malignant neoplasm of breast: Secondary | ICD-10-CM

## 2022-09-22 DIAGNOSIS — E1121 Type 2 diabetes mellitus with diabetic nephropathy: Secondary | ICD-10-CM

## 2022-09-22 DIAGNOSIS — Z1211 Encounter for screening for malignant neoplasm of colon: Secondary | ICD-10-CM

## 2022-09-22 DIAGNOSIS — Z780 Asymptomatic menopausal state: Secondary | ICD-10-CM

## 2022-09-22 DIAGNOSIS — E1159 Type 2 diabetes mellitus with other circulatory complications: Secondary | ICD-10-CM

## 2022-09-22 DIAGNOSIS — K219 Gastro-esophageal reflux disease without esophagitis: Secondary | ICD-10-CM

## 2022-09-22 LAB — POCT GLYCOSYLATED HEMOGLOBIN (HGB A1C): Hemoglobin A1C: 8.1 % — AB (ref 4.0–5.6)

## 2022-09-22 MED ORDER — ATORVASTATIN CALCIUM 40 MG PO TABS: 40.0000 mg | ORAL_TABLET | Freq: Every day | ORAL | 2 refills | Status: DC

## 2022-09-22 MED ORDER — METFORMIN HCL ER 750 MG PO TB24
750.0000 mg | ORAL_TABLET | Freq: Two times a day (BID) | ORAL | 2 refills | Status: DC
Start: 2022-09-22 — End: 2023-03-12

## 2022-09-22 MED ORDER — PIOGLITAZONE HCL 30 MG PO TABS
30.0000 mg | ORAL_TABLET | Freq: Every day | ORAL | 2 refills | Status: DC
Start: 2022-09-22 — End: 2023-03-12

## 2022-09-22 MED ORDER — GLIPIZIDE ER 2.5 MG PO TB24
2.5000 mg | ORAL_TABLET | Freq: Every day | ORAL | 2 refills | Status: DC
Start: 2022-09-22 — End: 2023-03-12

## 2022-09-22 MED ORDER — OMEPRAZOLE 20 MG PO CPDR: 20.0000 mg | DELAYED_RELEASE_CAPSULE | Freq: Every day | ORAL | 2 refills | Status: DC

## 2022-09-22 MED ORDER — ATENOLOL-CHLORTHALIDONE 50-25 MG PO TABS
1.0000 | ORAL_TABLET | Freq: Every day | ORAL | 2 refills | Status: DC
Start: 2022-09-22 — End: 2023-03-12

## 2022-10-02 ENCOUNTER — Ambulatory Visit
Admission: RE | Admit: 2022-10-02 | Discharge: 2022-10-02 | Disposition: A | Discharge status: Home / Self Care | Payer: Medicare HMO | Source: Ambulatory Visit | Attending: Family Medicine | Admitting: Family Medicine

## 2022-10-02 DIAGNOSIS — Z1231 Encounter for screening mammogram for malignant neoplasm of breast: Secondary | ICD-10-CM | POA: Insufficient documentation

## 2022-10-29 ENCOUNTER — Encounter: Payer: Self-pay | Admitting: Gastroenterology

## 2022-10-29 NOTE — H&P (Signed)
Pre-Procedure H&P   Patient ID: Michelle Montgomery is a 72 y.o. female.  Gastroenterology Provider: Jaynie Collins, DO  Referring Provider: Tawni Pummel, PA PCP: Alba Cory, MD  Date: 10/30/2022  HPI Michelle Montgomery is a 72 y.o. female who presents today for Esophagogastroduodenoscopy and Colonoscopy for Iron deficiency anemia; personal h/o polyps .  Reflux well controlled on ppi. If misses a few doses, notes inc reflux and dysphagia sx. Otherwise no dysphagia. No odynophagia/ weight or appetite change. No n/v or abdominal pain  Bm regular w/o melena/hematochezia, diarrhea/constipation  01/2004 E/C- Tas, + BE 09/2010 E/C- negative for BE 10/2017 E/C- TA x2, IH; HH, gastritis negative for HP, + FGP, GEJ 40 cm;  Brother- colon polyps Mother- crc Father- esophageal ca  Past Medical History:  Diagnosis Date   Arthritis    Arthrosis    Bilateral leg cramps    Bilateral sciatica    Cancer (HCC)    basil, sarconoma   Chronic insomnia    Diabetes mellitus without complication (HCC)    Dyslipidemia    Family history of adverse reaction to anesthesia    mom had PONV   GERD (gastroesophageal reflux disease)    History of colon polyps    History of skin cancer    Dr. Lowella Bandy, history of basal cell and squamous cells carcinoma   Hypertension    Microalbuminuria    100-DM   Obesity    Plantar fasciitis, left    Dr. Al Corpus   Pneumonia    PONV (postoperative nausea and vomiting)    Reflux    Right bundle branch block     Past Surgical History:  Procedure Laterality Date   achiles tendon Left    BREAST BIOPSY Left 2015   benign   FOOT SURGERY Left 08/29/2011   Spur Removal , and Achilles Tendon Tendolysis   KNEE ARTHROPLASTY Right 04/05/2021   Procedure: COMPUTER ASSISTED TOTAL KNEE ARTHROPLASTY;  Surgeon: Donato Heinz, MD;  Location: [facility] ORS;  Service: Orthopedics;  Laterality: Right;   KNEE ARTHROPLASTY Left 08/28/2021   Procedure:  COMPUTER ASSISTED TOTAL KNEE ARTHROPLASTY;  Surgeon: Donato Heinz, MD;  Location: [facility] ORS;  Service: Orthopedics;  Laterality: Left;   VARICOSE VEIN SURGERY Bilateral     Family History Brother- colon polyps Mother- crc Father- esophageal ca No h/o GI disease or malignancy  Review of Systems  Constitutional:  Negative for activity change, appetite change, chills, diaphoresis, fatigue, fever and unexpected weight change.  HENT:  Positive for trouble swallowing (if doesn't take ppi). Negative for voice change.   Respiratory:  Negative for shortness of breath and wheezing.   Cardiovascular:  Negative for chest pain, palpitations and leg swelling.  Gastrointestinal:  Negative for abdominal distention, abdominal pain, anal bleeding, blood in stool, constipation, diarrhea, nausea, rectal pain and vomiting.  Musculoskeletal:  Negative for arthralgias and myalgias.  Skin:  Negative for color change and pallor.  Neurological:  Negative for dizziness, syncope and weakness.  Psychiatric/Behavioral:  Negative for confusion.   All other systems reviewed and are negative.    Medications No current facility-administered medications on file prior to encounter.   Current Outpatient Medications on File Prior to Encounter  Medication Sig Dispense Refill   ferrous sulfate 325 (65 FE) MG tablet Take 325 mg by mouth daily with breakfast.     blood glucose meter kit and supplies Dispense based on patient and insurance preference. Use up to four times daily as directed. (  FOR ICD-10 E10.9, E11.9). 1 each 0   Calcium Carb-Cholecalciferol (CALCIUM 600 + D PO) Take 1 tablet by mouth daily.     Calcium Polycarbophil (FIBER-CAPS PO) Take 1 capsule by mouth daily.     cetirizine (ZYRTEC) 10 MG tablet Take 10 mg by mouth daily as needed for allergies.     glucose blood (ONETOUCH VERIO) test strip 1 each by Other route in the morning and at bedtime. Use as instructed 200 each 5   psyllium (REGULOID) 0.52 g  capsule Take 0.52 g by mouth daily.      Pertinent medications related to GI and procedure were reviewed by me with the patient prior to the procedure   Current Facility-Administered Medications:    0.9 %  sodium chloride infusion, , Intravenous, Continuous, Jaynie Collins, DO, Last Rate: 20 mL/hr at 10/30/22 0834, New Bag at 10/30/22 0834  sodium chloride 20 mL/hr at 10/30/22 1610       Allergies  Allergen Reactions   Codeine Nausea Only    Passed out   Celecoxib Hives   Penicillins Rash    IgE = 17 (WNL) on 04/02/2021   Allergies were reviewed by me prior to the procedure  Objective   Body mass index is 34.95 kg/m. Vitals:   10/30/22 0812  BP: 129/83  Pulse: 88  Resp: 18  Temp: (!) 96 F (35.6 C)  TempSrc: Temporal  SpO2: 96%  Weight: 110.5 kg  Height: 5\' 10"  (1.778 m)     Physical Exam Vitals and nursing note reviewed.  Constitutional:      General: She is not in acute distress.    Appearance: Normal appearance. She is not ill-appearing, toxic-appearing or diaphoretic.  HENT:     Head: Normocephalic and atraumatic.     Nose: Nose normal.     Mouth/Throat:     Mouth: Mucous membranes are moist.     Pharynx: Oropharynx is clear.  Eyes:     General: No scleral icterus.    Extraocular Movements: Extraocular movements intact.  Cardiovascular:     Rate and Rhythm: Normal rate and regular rhythm.     Heart sounds: Normal heart sounds. No murmur heard.    No friction rub. No gallop.  Pulmonary:     Effort: Pulmonary effort is normal. No respiratory distress.     Breath sounds: Normal breath sounds. No wheezing, rhonchi or rales.  Abdominal:     General: Bowel sounds are normal. There is no distension.     Palpations: Abdomen is soft.     Tenderness: There is no abdominal tenderness. There is no guarding or rebound.  Musculoskeletal:     Cervical back: Neck supple.     Right lower leg: No edema.     Left lower leg: No edema.  Skin:    General:  Skin is warm and dry.     Coloration: Skin is not jaundiced or pale.  Neurological:     General: No focal deficit present.     Mental Status: She is alert and oriented to person, place, and time. Mental status is at baseline.  Psychiatric:        Mood and Affect: Mood normal.        Behavior: Behavior normal.        Thought Content: Thought content normal.        Judgment: Judgment normal.      Assessment:  Ms. KHLOEI PALELLA is a 72 y.o. female  who presents today  for Esophagogastroduodenoscopy and Colonoscopy for Iron deficiency anemia; personal h/o polyps .  Plan:  Esophagogastroduodenoscopy and Colonoscopy with possible intervention today  Esophagogastroduodenoscopy and Colonoscopy with possible biopsy, control of bleeding, polypectomy, and interventions as necessary has been discussed with the patient/patient representative. Informed consent was obtained from the patient/patient representative after explaining the indication, nature, and risks of the procedure including but not limited to death, bleeding, perforation, missed neoplasm/lesions, cardiorespiratory compromise, and reaction to medications. Opportunity for questions was given and appropriate answers were provided. Patient/patient representative has verbalized understanding is amenable to undergoing the procedure.   Jaynie Collins, DO  [facility]  Portions of the record may have been created with voice recognition software. Occasional wrong-word or 'sound-a-like' substitutions may have occurred due to the inherent limitations of voice recognition software.  Read the chart carefully and recognize, using context, where substitutions may have occurred.

## 2022-10-30 ENCOUNTER — Ambulatory Visit
Admission: RE | Admit: 2022-10-30 | Discharge: 2022-10-30 | Disposition: A | Discharge status: Home / Self Care | Payer: Medicare HMO | Attending: Gastroenterology | Admitting: Gastroenterology

## 2022-10-30 ENCOUNTER — Other Ambulatory Visit: Payer: Self-pay

## 2022-10-30 ENCOUNTER — Encounter
Admission: RE | Disposition: A | Discharge status: Home / Self Care | Payer: Self-pay | Source: Home / Self Care | Attending: Gastroenterology

## 2022-10-30 ENCOUNTER — Ambulatory Visit: Payer: Medicare HMO | Admitting: Registered Nurse

## 2022-10-30 ENCOUNTER — Encounter: Payer: Self-pay | Admitting: Gastroenterology

## 2022-10-30 DIAGNOSIS — K2289 Other specified disease of esophagus: Secondary | ICD-10-CM | POA: Insufficient documentation

## 2022-10-30 DIAGNOSIS — K219 Gastro-esophageal reflux disease without esophagitis: Secondary | ICD-10-CM | POA: Insufficient documentation

## 2022-10-30 DIAGNOSIS — R1310 Dysphagia, unspecified: Secondary | ICD-10-CM | POA: Diagnosis not present

## 2022-10-30 DIAGNOSIS — I10 Essential (primary) hypertension: Secondary | ICD-10-CM | POA: Insufficient documentation

## 2022-10-30 DIAGNOSIS — E119 Type 2 diabetes mellitus without complications: Secondary | ICD-10-CM | POA: Diagnosis not present

## 2022-10-30 DIAGNOSIS — K635 Polyp of colon: Secondary | ICD-10-CM | POA: Diagnosis not present

## 2022-10-30 DIAGNOSIS — K317 Polyp of stomach and duodenum: Secondary | ICD-10-CM | POA: Diagnosis not present

## 2022-10-30 DIAGNOSIS — G709 Myoneural disorder, unspecified: Secondary | ICD-10-CM | POA: Insufficient documentation

## 2022-10-30 DIAGNOSIS — Z800 Family history of malignant neoplasm of digestive organs: Secondary | ICD-10-CM | POA: Insufficient documentation

## 2022-10-30 DIAGNOSIS — N289 Disorder of kidney and ureter, unspecified: Secondary | ICD-10-CM | POA: Diagnosis not present

## 2022-10-30 DIAGNOSIS — K3189 Other diseases of stomach and duodenum: Secondary | ICD-10-CM | POA: Insufficient documentation

## 2022-10-30 DIAGNOSIS — Z87891 Personal history of nicotine dependence: Secondary | ICD-10-CM | POA: Insufficient documentation

## 2022-10-30 DIAGNOSIS — D509 Iron deficiency anemia, unspecified: Secondary | ICD-10-CM | POA: Diagnosis not present

## 2022-10-30 DIAGNOSIS — K2950 Unspecified chronic gastritis without bleeding: Secondary | ICD-10-CM | POA: Diagnosis not present

## 2022-10-30 DIAGNOSIS — K640 First degree hemorrhoids: Secondary | ICD-10-CM | POA: Insufficient documentation

## 2022-10-30 DIAGNOSIS — Z83719 Family history of colon polyps, unspecified: Secondary | ICD-10-CM | POA: Insufficient documentation

## 2022-10-30 DIAGNOSIS — K449 Diaphragmatic hernia without obstruction or gangrene: Secondary | ICD-10-CM | POA: Diagnosis not present

## 2022-10-30 DIAGNOSIS — Z1211 Encounter for screening for malignant neoplasm of colon: Secondary | ICD-10-CM | POA: Diagnosis not present

## 2022-10-30 DIAGNOSIS — K649 Unspecified hemorrhoids: Secondary | ICD-10-CM | POA: Diagnosis not present

## 2022-10-30 DIAGNOSIS — K2970 Gastritis, unspecified, without bleeding: Secondary | ICD-10-CM | POA: Diagnosis not present

## 2022-10-30 DIAGNOSIS — Z86010 Personal history of colonic polyps: Secondary | ICD-10-CM | POA: Diagnosis not present

## 2022-10-30 HISTORY — PX: ESOPHAGOGASTRODUODENOSCOPY (EGD) WITH PROPOFOL: SHX5813

## 2022-10-30 HISTORY — PX: BIOPSY: SHX5522

## 2022-10-30 HISTORY — PX: COLONOSCOPY WITH PROPOFOL: SHX5780

## 2022-10-30 HISTORY — PX: ESOPHAGEAL DILATION: SHX303

## 2022-10-30 HISTORY — PX: POLYPECTOMY: SHX5525

## 2022-10-30 LAB — GLUCOSE, CAPILLARY: Glucose-Capillary: 158 mg/dL — ABNORMAL HIGH (ref 70–99)

## 2022-10-30 SURGERY — COLONOSCOPY WITH PROPOFOL
Anesthesia: General

## 2022-10-30 MED ORDER — PHENYLEPHRINE 80 MCG/ML (10ML) SYRINGE FOR IV PUSH (FOR BLOOD PRESSURE SUPPORT)
PREFILLED_SYRINGE | INTRAVENOUS | Status: AC
  Filled 2022-10-30: qty 10

## 2022-10-30 MED ORDER — PROPOFOL 500 MG/50ML IV EMUL
INTRAVENOUS | Status: DC | PRN
  Administered 2022-10-30: 150 ug/kg/min via INTRAVENOUS

## 2022-10-30 MED ORDER — GLYCOPYRROLATE 0.2 MG/ML IJ SOLN
INTRAMUSCULAR | Status: DC | PRN
  Administered 2022-10-30: .2 mg via INTRAVENOUS

## 2022-10-30 MED ORDER — PHENYLEPHRINE 80 MCG/ML (10ML) SYRINGE FOR IV PUSH (FOR BLOOD PRESSURE SUPPORT)
PREFILLED_SYRINGE | INTRAVENOUS | Status: DC | PRN
Start: 2022-10-30 — End: 2022-10-30
  Administered 2022-10-30: 80 ug via INTRAVENOUS
  Administered 2022-10-30: 160 ug via INTRAVENOUS
  Administered 2022-10-30: 80 ug via INTRAVENOUS

## 2022-10-30 MED ORDER — PROPOFOL 1000 MG/100ML IV EMUL
INTRAVENOUS | Status: AC
  Filled 2022-10-30: qty 100

## 2022-10-30 MED ORDER — GLYCOPYRROLATE 0.2 MG/ML IJ SOLN
INTRAMUSCULAR | Status: AC
  Filled 2022-10-30: qty 1

## 2022-10-30 MED ORDER — PHENYLEPHRINE HCL (PRESSORS) 10 MG/ML IV SOLN
INTRAVENOUS | Status: DC | PRN
  Administered 2022-10-30 (×2): 80 ug via INTRAVENOUS

## 2022-10-30 MED ORDER — PROPOFOL 10 MG/ML IV BOLUS
INTRAVENOUS | Status: DC | PRN
  Administered 2022-10-30: 30 mg via INTRAVENOUS
  Administered 2022-10-30: 70 mg via INTRAVENOUS

## 2022-10-30 MED ORDER — DEXMEDETOMIDINE HCL IN NACL 80 MCG/20ML IV SOLN
INTRAVENOUS | Status: DC | PRN
  Administered 2022-10-30: 12 ug via INTRAVENOUS
  Administered 2022-10-30: 4 ug via INTRAVENOUS

## 2022-10-30 MED ORDER — LIDOCAINE HCL (CARDIAC) PF 100 MG/5ML IV SOSY
PREFILLED_SYRINGE | INTRAVENOUS | Status: DC | PRN
  Administered 2022-10-30: 60 mg via INTRAVENOUS

## 2022-10-30 MED ORDER — SODIUM CHLORIDE 0.9 % IV SOLN: INTRAVENOUS | Status: DC

## 2022-10-30 NOTE — Op Note (Addendum)
[facility] Patient Name: Michelle Montgomery Procedure Date: 10/30/2022 8:44 AM MRN: [id number] Account #: [id number] Date of Birth: May 23, 1950 Admit Type: Outpatient Age: 72 Room: [facility] ENDO ROOM 1 Gender: Female Note Status: Supervisor Override Instrument Name: Prentice Docker [id number] Procedure:             Colonoscopy Indications:           High risk colon cancer surveillance: Personal history                         of colonic polyps, Iron deficiency anemia Providers:             Trenda Moots, DO Referring MD:          Onnie Boer. Sowles, MD (Referring MD) Medicines:             Monitored Anesthesia Care Complications:         No immediate complications. Estimated blood loss:                         Minimal. Procedure:             Pre-Anesthesia Assessment:                        - Prior to the procedure, a History and Physical was                         performed, and patient medications and allergies were                         reviewed. The patient is competent. The risks and                         benefits of the procedure and the sedation options and                         risks were discussed with the patient. All questions                         were answered and informed consent was obtained.                         Patient identification and proposed procedure were                         verified by the physician, the nurse, the anesthetist                         and the technician in the endoscopy suite. Mental                         Status Examination: alert and oriented. Airway                         Examination: normal oropharyngeal airway and neck                         mobility. Respiratory Examination: clear to  auscultation. CV Examination: RRR, no murmurs, no S3                         or S4. Prophylactic Antibiotics: The patient does not                         require prophylactic  antibiotics. Prior                         Anticoagulants: The patient has taken no anticoagulant                         or antiplatelet agents. ASA Grade Assessment: III - A                         patient with severe systemic disease. After reviewing                         the risks and benefits, the patient was deemed in                         satisfactory condition to undergo the procedure. The                         anesthesia plan was to use monitored anesthesia care                         (MAC). Immediately prior to administration of                         medications, the patient was re-assessed for adequacy                         to receive sedatives. The heart rate, respiratory                         rate, oxygen saturations, blood pressure, adequacy of                         pulmonary ventilation, and response to care were                         monitored throughout the procedure. The physical                         status of the patient was re-assessed after the                         procedure.                        After obtaining informed consent, the colonoscope was                         passed under direct vision. Throughout the procedure,                         the patient's blood pressure, pulse, and oxygen  saturations were monitored continuously. The                         Colonoscope was introduced through the anus and                         advanced to the the terminal ileum, with                         identification of the appendiceal orifice and IC                         valve. The colonoscopy was performed without                         difficulty. The patient tolerated the procedure well.                         The quality of the bowel preparation was evaluated                         using the BBPS [facility] Bowel Preparation Scale) with                         scores of: Right Colon = 3, Transverse Colon = 3 and                          Left Colon = 3 (entire mucosa seen well with no                         residual staining, small fragments of stool or opaque                         liquid). The total BBPS score equals 9. The terminal                         ileum, ileocecal valve, appendiceal orifice, and                         rectum were photographed. Findings:      The perianal and digital rectal examinations were normal. Pertinent       negatives include normal sphincter tone.      The terminal ileum appeared normal. Estimated blood loss: none.      Retroflexion in the right colon was performed.      A 1 to 2 mm polyp was found in the transverse colon. The polyp was       sessile. The polyp was removed with a jumbo cold forceps. Resection and       retrieval were complete. Estimated blood loss was minimal.      Non-bleeding internal hemorrhoids were found during retroflexion. The       hemorrhoids were Grade I (internal hemorrhoids that do not prolapse).       Estimated blood loss: none.      The exam was otherwise without abnormality on direct and retroflexion       views. Impression:            - The examined portion of the ileum was  normal.                        - One 1 to 2 mm polyp in the transverse colon, removed                         with a jumbo cold forceps. Resected and retrieved.                        - Non-bleeding internal hemorrhoids.                        - The examination was otherwise normal on direct and                         retroflexion views. Recommendation:        - Patient has a contact number available for                         emergencies. The signs and symptoms of potential                         delayed complications were discussed with the patient.                         Return to normal activities tomorrow. Written                         discharge instructions were provided to the patient.                        - Discharge patient to home.                         - Resume previous diet.                        - Continue present medications.                        - Await pathology results.                        - Repeat colonoscopy for surveillance based on                         pathology results.                        - Return to GI office as previously scheduled.                        - If no response to iron therapy, recommend video                         capsule endoscopy in setting of iron deficiency anemia.                        - The findings and recommendations were discussed with  the patient. Procedure Code(s):     --- Professional ---                        [phone], Colonoscopy, flexible; with biopsy, single or                         multiple Diagnosis Code(s):     --- Professional ---                        K64.0, First degree hemorrhoids                        D12.3, Benign neoplasm of transverse colon (hepatic                         flexure or splenic flexure)                        D50.9, Iron deficiency anemia, unspecified CPT copyright 2022 American Medical Association. All rights reserved. The codes documented in this report are preliminary and upon coder review may  be revised to meet current compliance requirements. Attending Participation:      I personally performed the entire procedure. Elfredia Nevins, DO Jaynie Collins DO, DO 10/30/2022 9:37:55 AM This report has been signed electronically. Number of Addenda: 0 Note Initiated On: 10/30/2022 8:44 AM Scope Withdrawal Time: 0 hours 36 minutes 39 seconds  Total Procedure Duration: 0 hours 42 minutes 9 seconds  Estimated Blood Loss:  Estimated blood loss was minimal.      [facility]

## 2022-10-30 NOTE — Transfer of Care (Signed)
Immediate Anesthesia Transfer of Care Note  Patient: Michelle Montgomery  Procedure(s) Performed: COLONOSCOPY WITH PROPOFOL ESOPHAGOGASTRODUODENOSCOPY (EGD) WITH PROPOFOL BIOPSY ESOPHAGEAL DILATION POLYPECTOMY  Patient Location: PACU  Anesthesia Type:General  Level of Consciousness: awake, alert , and oriented  Airway & Oxygen Therapy: Patient Spontanous Breathing  Post-op Assessment: Report given to RN and Post -op Vital signs reviewed and stable  Post vital signs: Reviewed and stable  Last Vitals:  Vitals Value Taken Time  BP 103/57 10/30/22 0938  Temp    Pulse 79 10/30/22 0939  Resp 23 10/30/22 0940  SpO2 97 % 10/30/22 0939  Vitals shown include unfiled device data.  Last Pain:  Vitals:   10/30/22 0812  TempSrc: Temporal  PainSc: 0-No pain         Complications: No notable events documented.

## 2022-10-30 NOTE — Anesthesia Preprocedure Evaluation (Signed)
Anesthesia Evaluation  Patient identified by MRN, date of birth, ID band Patient awake    Reviewed: Allergy & Precautions, NPO status , Patient's Chart, lab work & pertinent test results  History of Anesthesia Complications (+) PONV, Family history of anesthesia reaction and history of anesthetic complications  Airway Mallampati: III  TM Distance: <3 FB Neck ROM: full    Dental  (+) Chipped   Pulmonary neg shortness of breath, former smoker   Pulmonary exam normal        Cardiovascular Exercise Tolerance: Good hypertension, Normal cardiovascular exam+ dysrhythmias      Neuro/Psych  Neuromuscular disease  negative psych ROS   GI/Hepatic Neg liver ROS,GERD  Controlled,,  Endo/Other  negative endocrine ROSdiabetes    Renal/GU Renal disease  negative genitourinary   Musculoskeletal   Abdominal   Peds  Hematology negative hematology ROS (+)   Anesthesia Other Findings Past Medical History: No date: Arthritis No date: Arthrosis No date: Bilateral leg cramps No date: Bilateral sciatica No date: Cancer (HCC)     Comment:  basil, sarconoma No date: Chronic insomnia No date: Diabetes mellitus without complication (HCC) No date: Dyslipidemia No date: Family history of adverse reaction to anesthesia     Comment:  mom had PONV No date: GERD (gastroesophageal reflux disease) No date: History of colon polyps No date: History of skin cancer     Comment:  Dr. Lowella Bandy, history of basal cell and squamous               cells carcinoma No date: Hypertension No date: Microalbuminuria     Comment:  100-DM No date: Obesity No date: Plantar fasciitis, left     Comment:  Dr. Al Corpus No date: Pneumonia No date: PONV (postoperative nausea and vomiting) No date: Reflux No date: Right bundle branch block  Past Surgical History: No date: achiles tendon; Left 2015: BREAST BIOPSY; Left     Comment:  benign 08/29/2011: FOOT  SURGERY; Left     Comment:  Spur Removal , and Achilles Tendon Tendolysis 04/05/2021: KNEE ARTHROPLASTY; Right     Comment:  Procedure: COMPUTER ASSISTED TOTAL KNEE ARTHROPLASTY;                Surgeon: Donato Heinz, MD;  Location: [facility] ORS;                Service: Orthopedics;  Laterality: Right; No date: VARICOSE VEIN SURGERY; Bilateral  BMI    Body Mass Index: 34.04 kg/m      Reproductive/Obstetrics negative OB ROS                             Anesthesia Physical Anesthesia Plan  ASA: 3  Anesthesia Plan: General   Post-op Pain Management: Minimal or no pain anticipated   Induction: Intravenous  PONV Risk Score and Plan: 3 and Propofol infusion, TIVA and Ondansetron  Airway Management Planned: Nasal Cannula  Additional Equipment: None  Intra-op Plan:   Post-operative Plan:   Informed Consent: I have reviewed the patients History and Physical, chart, labs and discussed the procedure including the risks, benefits and alternatives for the proposed anesthesia with the patient or authorized representative who has indicated his/her understanding and acceptance.     Dental advisory given  Plan Discussed with: CRNA and Surgeon  Anesthesia Plan Comments: (Discussed risks of anesthesia with patient, including possibility of difficulty with spontaneous ventilation under anesthesia necessitating airway intervention, PONV, and rare risks  such as cardiac or respiratory or neurological events, and allergic reactions. Discussed the role of CRNA in patient's perioperative care. Patient understands.)        Anesthesia Quick Evaluation

## 2022-10-30 NOTE — Op Note (Signed)
[facility] Gastroenterology Patient Name: Michelle Montgomery Procedure Date: 10/30/2022 8:44 AM MRN: [id number] Account #: [id number] Date of Birth: 03/04/1950 Admit Type: Outpatient Age: 72 Room: [facility] ENDO ROOM 1 Gender: Female Note Status: Finalized Instrument Name: Laurette Schimke [id number] Procedure:             Upper GI endoscopy Indications:           Dysphagia, Iron deficiency anemia Providers:             Trenda Moots, DO Referring MD:          Onnie Boer. Sowles, MD (Referring MD) Medicines:             Monitored Anesthesia Care Complications:         No immediate complications. Estimated blood loss:                         Minimal. Procedure:             Pre-Anesthesia Assessment:                        - Prior to the procedure, a History and Physical was                         performed, and patient medications and allergies were                         reviewed. The patient is competent. The risks and                         benefits of the procedure and the sedation options and                         risks were discussed with the patient. All questions                         were answered and informed consent was obtained.                         Patient identification and proposed procedure were                         verified by the physician, the nurse, the anesthetist                         and the technician in the endoscopy suite. Mental                         Status Examination: alert and oriented. Airway                         Examination: normal oropharyngeal airway and neck                         mobility. Respiratory Examination: clear to                         auscultation. CV Examination: RRR, no murmurs, no S3  or S4. Prophylactic Antibiotics: The patient does not                         require prophylactic antibiotics. Prior                         Anticoagulants: The patient has taken no  anticoagulant                         or antiplatelet agents. ASA Grade Assessment: III - A                         patient with severe systemic disease. After reviewing                         the risks and benefits, the patient was deemed in                         satisfactory condition to undergo the procedure. The                         anesthesia plan was to use monitored anesthesia care                         (MAC). Immediately prior to administration of                         medications, the patient was re-assessed for adequacy                         to receive sedatives. The heart rate, respiratory                         rate, oxygen saturations, blood pressure, adequacy of                         pulmonary ventilation, and response to care were                         monitored throughout the procedure. The physical                         status of the patient was re-assessed after the                         procedure.                        After obtaining informed consent, the endoscope was                         passed under direct vision. Throughout the procedure,                         the patient's blood pressure, pulse, and oxygen                         saturations were monitored continuously. The Endoscope  was introduced through the mouth, and advanced to the                         third part of duodenum. The upper GI endoscopy was                         accomplished without difficulty. The patient tolerated                         the procedure well. Findings:      The duodenal bulb, first portion of the duodenum, second portion of the       duodenum and third portion of the duodenum were normal. Biopsies for       histology were taken with a cold forceps for evaluation of celiac       disease. Estimated blood loss was minimal.      Multiple 1 to 7 mm sessile polyps with no bleeding and no stigmata of       recent bleeding were found  in the gastric body. Estimated blood loss:       none.      Localized mild inflammation characterized by erythema was found in the       gastric antrum. Biopsies were taken with a cold forceps for Helicobacter       pylori testing. Estimated blood loss was minimal.      The exam of the stomach was otherwise normal.      A small hiatal hernia was present. Estimated blood loss: none.      The Z-line was irregular. tongue and island of salmon colored mucosa       exending 1 and 2 cm above the z line respectively. Biopsies were taken       with a cold forceps for histology. Estimated blood loss was minimal.      No endoscopic abnormality was evident in the esophagus to explain the       patient's complaint of dysphagia. It was decided, however, to proceed       with dilation of the entire esophagus. The scope was withdrawn. Dilation       was performed with a Maloney dilator with no resistance at 52 Fr. The       dilation site was examined following endoscope reinsertion and showed no       change. Estimated blood loss: none.      The exam of the esophagus was otherwise normal. Impression:            - Normal duodenal bulb, first portion of the duodenum,                         second portion of the duodenum and third portion of                         the duodenum. Biopsied.                        - Multiple gastric polyps.                        - Gastritis. Biopsied.                        - Small hiatal  hernia.                        - Z-line irregular. Biopsied.                        - No endoscopic esophageal abnormality to explain                         patient's dysphagia. Esophagus dilated. Dilated. Recommendation:        - Patient has a contact number available for                         emergencies. The signs and symptoms of potential                         delayed complications were discussed with the patient.                         Return to normal activities tomorrow.  Written                         discharge instructions were provided to the patient.                        - Discharge patient to home.                        - Resume previous diet.                        - Continue present medications.                        - No ibuprofen, naproxen, or other non-steroidal                         anti-inflammatory drugs.                        - Await pathology results.                        - Repeat upper endoscopy for surveillance based on                         pathology results.                        - Return to GI office as previously scheduled.                        - proceed with colonoscopy. see report for further                         recommendations.                        - The findings and recommendations were discussed with                         the patient. Procedure Code(s):     ---  Professional ---                        [phone], Esophagogastroduodenoscopy, flexible,                         transoral; with biopsy, single or multiple                        [id number], Dilation of esophagus, by unguided sound or                         bougie, single or multiple passes Diagnosis Code(s):     --- Professional ---                        K31.7, Polyp of stomach and duodenum                        K29.70, Gastritis, unspecified, without bleeding                        K44.9, Diaphragmatic hernia without obstruction or                         gangrene                        K22.89, Other specified disease of esophagus                        R13.10, Dysphagia, unspecified                        D50.9, Iron deficiency anemia, unspecified CPT copyright 2022 American Medical Association. All rights reserved. The codes documented in this report are preliminary and upon coder review may  be revised to meet current compliance requirements. Attending Participation:      I personally performed the entire procedure. Elfredia Nevins, DO Jaynie Collins DO, DO 10/30/2022 9:33:28 AM This report has been signed electronically. Number of Addenda: 0 Note Initiated On: 10/30/2022 8:44 AM Estimated Blood Loss:  Estimated blood loss was minimal.      [facility]

## 2022-10-30 NOTE — Anesthesia Postprocedure Evaluation (Signed)
Anesthesia Post Note  Patient: MICHALE LAWWILL  Procedure(s) Performed: COLONOSCOPY WITH PROPOFOL ESOPHAGOGASTRODUODENOSCOPY (EGD) WITH PROPOFOL BIOPSY ESOPHAGEAL DILATION POLYPECTOMY  Patient location during evaluation: PACU Anesthesia Type: General Level of consciousness: awake and alert Pain management: pain level controlled Vital Signs Assessment: post-procedure vital signs reviewed and stable Respiratory status: spontaneous breathing, nonlabored ventilation, respiratory function stable and patient connected to nasal cannula oxygen Cardiovascular status: blood pressure returned to baseline and stable Postop Assessment: no apparent nausea or vomiting Anesthetic complications: no  No notable events documented.   Last Vitals:  Vitals:   10/30/22 0950 10/30/22 0959  BP: 112/72 116/75  Pulse:  75  Resp: (!) 22 19  Temp:    SpO2:  98%    Last Pain:  Vitals:   10/30/22 0812  TempSrc: Temporal  PainSc: 0-No pain                 Stephanie Coup

## 2022-10-30 NOTE — Interval H&P Note (Signed)
History and Physical Interval Note: Preprocedure H&P from 10/30/22  was reviewed and there was no interval change after seeing and examining the patient.  Written consent was obtained from the patient after discussion of risks, benefits, and alternatives. Patient has consented to proceed with Esophagogastroduodenoscopy and Colonoscopy with possible intervention   10/30/2022 8:44 AM  Michelle Montgomery  has presented today for surgery, with the diagnosis of 280.9 (ICD-9-CM) - D50.9 (ICD-10-CM) - Iron deficiency anemia, unspecified iron deficiency anemia typeV12.72 (ICD-9-CM) - Z86.010 (ICD-10-CM) - History of colon polyps.  The various methods of treatment have been discussed with the patient and family. After consideration of risks, benefits and other options for treatment, the patient has consented to  Procedure(s): COLONOSCOPY WITH PROPOFOL (N/A) ESOPHAGOGASTRODUODENOSCOPY (EGD) WITH PROPOFOL (N/A) as a surgical intervention.  The patient's history has been reviewed, patient examined, no change in status, stable for surgery.  I have reviewed the patient's chart and labs.  Questions were answered to the patient's satisfaction.     Michelle Montgomery

## 2022-10-31 ENCOUNTER — Encounter: Payer: Self-pay | Admitting: Gastroenterology

## 2022-11-06 DIAGNOSIS — H524 Presbyopia: Secondary | ICD-10-CM | POA: Diagnosis not present

## 2022-11-06 DIAGNOSIS — E113393 Type 2 diabetes mellitus with moderate nonproliferative diabetic retinopathy without macular edema, bilateral: Secondary | ICD-10-CM | POA: Diagnosis not present

## 2022-11-06 LAB — HM DIABETES EYE EXAM

## 2022-11-06 LAB — SURGICAL PATHOLOGY

## 2022-11-20 DIAGNOSIS — L814 Other melanin hyperpigmentation: Secondary | ICD-10-CM | POA: Diagnosis not present

## 2022-11-20 DIAGNOSIS — L570 Actinic keratosis: Secondary | ICD-10-CM | POA: Diagnosis not present

## 2022-11-20 DIAGNOSIS — D1801 Hemangioma of skin and subcutaneous tissue: Secondary | ICD-10-CM | POA: Diagnosis not present

## 2022-11-20 DIAGNOSIS — L821 Other seborrheic keratosis: Secondary | ICD-10-CM | POA: Diagnosis not present

## 2022-11-20 DIAGNOSIS — L905 Scar conditions and fibrosis of skin: Secondary | ICD-10-CM | POA: Diagnosis not present

## 2022-11-20 DIAGNOSIS — L820 Inflamed seborrheic keratosis: Secondary | ICD-10-CM | POA: Diagnosis not present

## 2023-01-20 ENCOUNTER — Ambulatory Visit: Payer: Medicare HMO

## 2023-01-22 ENCOUNTER — Ambulatory Visit: Payer: Medicare HMO | Admitting: Family Medicine

## 2023-02-19 ENCOUNTER — Ambulatory Visit: Payer: Medicare HMO

## 2023-02-19 ENCOUNTER — Ambulatory Visit: Payer: Medicare HMO | Admitting: Family Medicine

## 2023-03-12 ENCOUNTER — Ambulatory Visit (INDEPENDENT_AMBULATORY_CARE_PROVIDER_SITE_OTHER): Payer: Medicare HMO | Admitting: Family Medicine

## 2023-03-12 ENCOUNTER — Encounter: Payer: Self-pay | Admitting: Family Medicine

## 2023-03-12 VITALS — BP 118/72 | HR 83 | Temp 97.7°F | Resp 16 | Ht 70.0 in | Wt 251.2 lb

## 2023-03-12 DIAGNOSIS — E1159 Type 2 diabetes mellitus with other circulatory complications: Secondary | ICD-10-CM

## 2023-03-12 DIAGNOSIS — Z780 Asymptomatic menopausal state: Secondary | ICD-10-CM

## 2023-03-12 DIAGNOSIS — E785 Hyperlipidemia, unspecified: Secondary | ICD-10-CM

## 2023-03-12 DIAGNOSIS — I152 Hypertension secondary to endocrine disorders: Secondary | ICD-10-CM | POA: Diagnosis not present

## 2023-03-12 DIAGNOSIS — E1169 Type 2 diabetes mellitus with other specified complication: Secondary | ICD-10-CM | POA: Diagnosis not present

## 2023-03-12 DIAGNOSIS — E1121 Type 2 diabetes mellitus with diabetic nephropathy: Secondary | ICD-10-CM | POA: Diagnosis not present

## 2023-03-12 DIAGNOSIS — K219 Gastro-esophageal reflux disease without esophagitis: Secondary | ICD-10-CM

## 2023-03-12 DIAGNOSIS — M8580 Other specified disorders of bone density and structure, unspecified site: Secondary | ICD-10-CM

## 2023-03-12 DIAGNOSIS — Z79899 Other long term (current) drug therapy: Secondary | ICD-10-CM

## 2023-03-12 DIAGNOSIS — D508 Other iron deficiency anemias: Secondary | ICD-10-CM

## 2023-03-12 DIAGNOSIS — G4700 Insomnia, unspecified: Secondary | ICD-10-CM | POA: Diagnosis not present

## 2023-03-12 DIAGNOSIS — J3489 Other specified disorders of nose and nasal sinuses: Secondary | ICD-10-CM | POA: Diagnosis not present

## 2023-03-12 LAB — POCT GLYCOSYLATED HEMOGLOBIN (HGB A1C): Hemoglobin A1C: 8.2 % — AB (ref 4.0–5.6)

## 2023-03-12 MED ORDER — BELSOMRA 10 MG PO TABS
10.0000 mg | ORAL_TABLET | Freq: Every evening | ORAL | 0 refills | Status: DC | PRN
Start: 2023-03-12 — End: 2023-08-25

## 2023-03-12 MED ORDER — TRIAMCINOLONE ACETONIDE 55 MCG/ACT NA AERO
2.0000 | INHALATION_SPRAY | Freq: Every day | NASAL | Status: AC
Start: 2023-03-12 — End: ?

## 2023-03-12 MED ORDER — ONETOUCH VERIO VI STRP
1.0000 | ORAL_STRIP | Freq: Two times a day (BID) | 5 refills | Status: AC
Start: 2023-03-12 — End: ?

## 2023-03-12 MED ORDER — NATEGLINIDE 60 MG PO TABS: 60.0000 mg | ORAL_TABLET | Freq: Three times a day (TID) | ORAL | 1 refills | Status: DC

## 2023-03-12 MED ORDER — ATORVASTATIN CALCIUM 40 MG PO TABS
40.0000 mg | ORAL_TABLET | Freq: Every day | ORAL | 1 refills | Status: DC
Start: 2023-03-12 — End: 2023-08-25

## 2023-03-12 MED ORDER — METFORMIN HCL ER 750 MG PO TB24
750.0000 mg | ORAL_TABLET | Freq: Two times a day (BID) | ORAL | 1 refills | Status: DC
Start: 2023-03-12 — End: 2023-08-25

## 2023-03-12 MED ORDER — PIOGLITAZONE HCL 30 MG PO TABS
30.0000 mg | ORAL_TABLET | Freq: Every day | ORAL | 1 refills | Status: DC
Start: 2023-03-12 — End: 2023-08-25

## 2023-03-12 MED ORDER — OMEPRAZOLE 20 MG PO CPDR
20.0000 mg | DELAYED_RELEASE_CAPSULE | Freq: Every day | ORAL | 1 refills | Status: DC
Start: 2023-03-12 — End: 2023-08-25

## 2023-03-12 MED ORDER — ATENOLOL-CHLORTHALIDONE 50-25 MG PO TABS: 1.0000 | ORAL_TABLET | Freq: Every day | ORAL | 1 refills | Status: DC

## 2023-03-12 NOTE — Progress Notes (Signed)
Name: Michelle Montgomery   MRN: [id number]    DOB: [birth year]   Date:03/12/2023       Progress Note  Subjective  Chief Complaint  Chief Complaint  Patient presents with   Medical Management of Chronic Issues   HPI   DMII with nephropathy: A1C spiked to  9.3 % April 2021 , she was able to changed her diet, was more compliant with metformin and pioglitazone and low dose Glipizide. A1C staying in the mid 7 % for a while. She states since last visit she went on a healthy diet and had lost weight but ate a lot of desserts during the holidays and A1C is now 8.2 %. She is on statin therapy for dyslipidemia , she is off ACE and ARB now due to hypotension , last urine micro was normal but is due for repeat level.  HTN is under control. She is still obese but lost some weight since last visit     HTN: BP is towards low end of normal ,  taking Tenoretic, off Norvasc and ARB due to hypotension. Doing well now, no orthostatic symptoms    Hyperlipidemia: taking Atorvastatin, she denies myalgia. Continue current medications, recheck level today    Insomnia: she is now off Ambien, taking melatonin, , and sleeps well most of the time. She tried Trazodone but did not help. I gave her a rx of Seroquel but she never started due to possible side effects. 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 She saw ENT and he suggested MRI brain but she decided to hold off  . She states mother, maternal grandmother, maternal aunt and cousin have the same problems Unchanged   GERD: taking Omeprazole, and no heartburn or regurgitation noticed.  She is doing well, she states when she skips medication for 3 days symptoms returns. She is off ACE and ARB and still has a dry cough . She states symptoms better controlled when she takes omeprazole daily Continue medications   OA: she has a total knee  replacement 04/05/2021 and left side on 07/03, she is doing better , takes medication prior to dental procedure   Osteopenia: repeat bone density done 08/2021 showed Frax slightly worse at 9.7 T and risk of hip fracture of 1.5 % , continue high dose calcium and vitamin D , plus physical activity . Recheck bone density this year and also vitamin D    Iron deficiency anemia: she is up to date with colonoscopy and EGD  Sinus pressure: noticed two weeks ago, also has nasal congestion, no fever, chills or post nasal drainage. She is taking otc oral antihistamines but has not been using her nasacort  Patient Active Problem List   Diagnosis Date Noted   Hypertension associated with type 2 diabetes mellitus (HCC) 10/17/2021   Osteopenia after menopause 06/18/2021   Total knee replacement status 04/05/2021   Primary osteoarthritis of left knee 03/11/2021   Varicose veins of both lower extremities 06/12/2016   BPV (benign positional vertigo), right 08/24/2015   Smell disturbance 01/16/2015   Osteoarthritis 11/03/2014   Cramps of lower extremity 11/03/2014   Neuralgia neuritis, sciatic nerve 11/03/2014   Insomnia, persistent 11/03/2014   Dyslipidemia 11/03/2014   Type 2 diabetes with nephropathy (HCC) 11/03/2014   Essential (primary) hypertension 11/03/2014   History of colon polyps 11/03/2014   H/O malignant  neoplasm of skin 11/03/2014   Microalbuminuria 11/03/2014   Plantar fasciitis 11/03/2014   Gastro-esophageal reflux disease without esophagitis 11/03/2014   Bundle branch block, right 11/03/2014    Past Surgical History:  Procedure Laterality Date   achiles tendon Left    BIOPSY  10/30/2022   Procedure: BIOPSY;  Surgeon: Jaynie Collins, DO;  Location: [facility] ENDOSCOPY;  Service: Gastroenterology;;   BREAST BIOPSY Left 2015   benign   COLONOSCOPY WITH PROPOFOL N/A 10/30/2022   Procedure: COLONOSCOPY WITH PROPOFOL;  Surgeon: Jaynie Collins, DO;  Location: [facility] ENDOSCOPY;   Service: Gastroenterology;  Laterality: N/A;   ESOPHAGEAL DILATION  10/30/2022   Procedure: ESOPHAGEAL DILATION;  Surgeon: Jaynie Collins, DO;  Location: [facility] ENDOSCOPY;  Service: Gastroenterology;;   ESOPHAGOGASTRODUODENOSCOPY (EGD) WITH PROPOFOL N/A 10/30/2022   Procedure: ESOPHAGOGASTRODUODENOSCOPY (EGD) WITH PROPOFOL;  Surgeon: Jaynie Collins, DO;  Location: [facility] ENDOSCOPY;  Service: Gastroenterology;  Laterality: N/A;   FOOT SURGERY Left 08/29/2011   Spur Removal , and Achilles Tendon Tendolysis   KNEE ARTHROPLASTY Right 04/05/2021   Procedure: COMPUTER ASSISTED TOTAL KNEE ARTHROPLASTY;  Surgeon: Donato Heinz, MD;  Location: [facility] ORS;  Service: Orthopedics;  Laterality: Right;   KNEE ARTHROPLASTY Left 08/28/2021   Procedure: COMPUTER ASSISTED TOTAL KNEE ARTHROPLASTY;  Surgeon: Donato Heinz, MD;  Location: [facility] ORS;  Service: Orthopedics;  Laterality: Left;   POLYPECTOMY  10/30/2022   Procedure: POLYPECTOMY;  Surgeon: Jaynie Collins, DO;  Location: [facility] ENDOSCOPY;  Service: Gastroenterology;;   VARICOSE VEIN SURGERY Bilateral     Family History  Problem Relation Age of Onset   Cancer Mother        Colon and breast   Anemia Mother    Hypertension Mother    Breast cancer Mother    Cancer Father        Esophageal   Heart disease Father    Diabetes Brother    Cancer Maternal Uncle        Colon   Cancer Maternal Grandmother        Colon   Healthy Brother    Melanoma Brother     Social History   Tobacco Use   Smoking status: Former    Current packs/day: 0.00    Average packs/day: 0.3 packs/day for 2.0 years (0.5 ttl pk-yrs)    Types: Cigarettes    Start date: 02/11/1968    Quit date: 02/10/1970    Years since quitting: 53.1   Smokeless tobacco: Never   Tobacco comments:    smoking cessation materials not required  Substance Use Topics   Alcohol use: No    Alcohol/week: 0.0 standard drinks of alcohol     Current Outpatient Medications:    blood  glucose meter kit and supplies, Dispense based on patient and insurance preference. Use up to four times daily as directed. (FOR ICD-10 E10.9, E11.9)., Disp: 1 each, Rfl: 0   Calcium Carb-Cholecalciferol (CALCIUM 600 + D PO), Take 1 tablet by mouth daily., Disp: , Rfl:    Calcium Polycarbophil (FIBER-CAPS PO), Take 1 capsule by mouth daily., Disp: , Rfl:    cetirizine (ZYRTEC) 10 MG tablet, Take 10 mg by mouth daily as needed for allergies., Disp: , Rfl:    ferrous sulfate 325 (65 FE) MG tablet, Take 325 mg by mouth daily with breakfast., Disp: , Rfl:    glucose blood (ONETOUCH VERIO) test strip, 1 each by Other route in the morning and at bedtime. Use as instructed, Disp: 200 each, Rfl:  5   nateglinide (STARLIX) 60 MG tablet, Take 1 tablet (60 mg total) by mouth 3 (three) times daily with meals., Disp: 270 tablet, Rfl: 1   psyllium (REGULOID) 0.52 g capsule, Take 0.52 g by mouth daily., Disp: , Rfl:    atenolol-chlorthalidone (TENORETIC) 50-25 MG tablet, Take 1 tablet by mouth daily., Disp: 90 tablet, Rfl: 1   atorvastatin (LIPITOR) 40 MG tablet, Take 1 tablet (40 mg total) by mouth daily., Disp: 90 tablet, Rfl: 1   clindamycin (CLEOCIN) 300 MG capsule, Take 300 mg by mouth 2 (two) times daily. (Patient not taking: Reported on 03/12/2023), Disp: , Rfl:    metFORMIN (GLUCOPHAGE-XR) 750 MG 24 hr tablet, Take 1 tablet (750 mg total) by mouth 2 (two) times daily., Disp: 180 tablet, Rfl: 1   omeprazole (PRILOSEC) 20 MG capsule, Take 1 capsule (20 mg total) by mouth daily., Disp: 90 capsule, Rfl: 1   pioglitazone (ACTOS) 30 MG tablet, Take 1 tablet (30 mg total) by mouth daily., Disp: 90 tablet, Rfl: 1  Allergies  Allergen Reactions   Codeine Nausea Only    Passed out   Celecoxib Hives   Penicillins Rash    IgE = 17 (WNL) on 04/02/2021    I personally reviewed active problem list, medication list, allergies, family history with the patient/caregiver today.   ROS  Ten systems reviewed and is  negative except as mentioned in HPI    Objective  Vitals:   03/12/23 1001  BP: 118/72  Pulse: 83  Resp: 16  Temp: 97.7 F (36.5 C)  TempSrc: Oral  SpO2: 95%  Weight: 251 lb 3.2 oz (113.9 kg)  Height: 5\' 10"  (1.778 m)    Body mass index is 36.04 kg/m.  Physical Exam  Constitutional: Patient appears well-developed and well-nourished. Obese  No distress.  HEENT: head atraumatic, normocephalic, pupils equal and reactive to light,, neck supple, boggy turbinates, no post nasal drainage, pain on facial sinus Cardiovascular: Normal rate, regular rhythm and normal heart sounds.  No murmur heard. No BLE edema. Pulmonary/Chest: Effort normal and breath sounds normal. No respiratory distress. Abdominal: Soft.  There is no tenderness. Psychiatric: Patient has a normal mood and affect. behavior is normal. Judgment and thought content normal.   Recent Results (from the past 2160 hours)  POCT glycosylated hemoglobin (Hb A1C)     Status: Abnormal   Collection Time: 03/12/23 10:06 AM  Result Value Ref Range   Hemoglobin A1C 8.2 (A) 4.0 - 5.6 %   HbA1c POC (<> result, manual entry)     HbA1c, POC (prediabetic range)     HbA1c, POC (controlled diabetic range)      Diabetic Foot Exam:     PHQ2/9:    03/12/2023    9:51 AM 09/22/2022   10:42 AM 08/04/2022    2:43 PM 07/22/2022    1:02 PM 05/27/2022    3:02 PM  Depression screen PHQ 2/9  Decreased Interest 0 0 0 0 0  Down, Depressed, Hopeless 0 0 0  0  PHQ - 2 Score 0 0 0 0 0  Altered sleeping 0 0 0  0  Tired, decreased energy 0 0 0  0  Change in appetite 0 0 0  0  Feeling bad or failure about yourself  0 0 0  0  Trouble concentrating 0 0 0  0  Moving slowly or fidgety/restless 0 0 0  0  Suicidal thoughts 0 0 0  0  PHQ-9 Score 0 0 0  0  Difficult doing work/chores Not difficult at all  Not difficult at all  Not difficult at all    phq 9 is negative  Fall Risk:    03/12/2023    9:51 AM 09/22/2022   10:42 AM 08/04/2022    2:43  PM 07/22/2022    1:02 PM 05/27/2022    3:02 PM  Fall Risk   Falls in the past year? 0 0 0 1 1  Number falls in past yr: 0 0 0 1 0  Injury with Fall? 0 0 0 1 1  Risk for fall due to : No Fall Risks No Fall Risks     Follow up Falls prevention discussed;Education provided;Falls evaluation completed Falls prevention discussed        Assessment & Plan  1. Type 2 diabetes with nephropathy (HCC) (Primary)  - POCT glycosylated hemoglobin (Hb A1C) - Microalbumin / creatinine urine ratio - metFORMIN (GLUCOPHAGE-XR) 750 MG 24 hr tablet; Take 1 tablet (750 mg total) by mouth 2 (two) times daily.  Dispense: 180 tablet; Refill: 1 - pioglitazone (ACTOS) 30 MG tablet; Take 1 tablet (30 mg total) by mouth daily.  Dispense: 90 tablet; Refill: 1 - nateglinide (STARLIX) 60 MG tablet; Take 1 tablet (60 mg total) by mouth 3 (three) times daily with meals.  Dispense: 270 tablet; Refill: 1 - glucose blood (ONETOUCH VERIO) test strip; 1 each by Other route in the morning and at bedtime. Use as instructed  Dispense: 200 each; Refill: 5  2. Hypertension associated with type 2 diabetes mellitus (HCC)  - atenolol-chlorthalidone (TENORETIC) 50-25 MG tablet; Take 1 tablet by mouth daily.  Dispense: 90 tablet; Refill: 1 - nateglinide (STARLIX) 60 MG tablet; Take 1 tablet (60 mg total) by mouth 3 (three) times daily with meals.  Dispense: 270 tablet; Refill: 1 - COMPLETE METABOLIC PANEL WITH GFR  3. Obesity, diabetes, and hypertension syndrome (HCC)  Adjusting medications, continue life style modifications  4. Morbid obesity (HCC)  Discussed with the patient the risk posed by an increased BMI. Discussed importance of portion control, calorie counting and at least 150 minutes of physical activity weekly. Avoid sweet beverages and drink more water. Eat at least 6 servings of fruit and vegetables daily   5. Dyslipidemia due to type 2 diabetes mellitus (HCC)  - atorvastatin (LIPITOR) 40 MG tablet; Take 1 tablet (40  mg total) by mouth daily.  Dispense: 90 tablet; Refill: 1 - Lipid panel  6. Insomnia, persistent  - Suvorexant (BELSOMRA) 10 MG TABS; Take 1 tablet (10 mg total) by mouth at bedtime as needed.  Dispense: 90 tablet; Refill: 0  7. Osteopenia after menopause  - VITAMIN D 25 Hydroxy (Vit-D Deficiency, Fractures)  8. Gastro-esophageal reflux disease without esophagitis  - omeprazole (PRILOSEC) 20 MG capsule; Take 1 capsule (20 mg total) by mouth daily.  Dispense: 90 capsule; Refill: 1  9. Other iron deficiency anemia  - CBC with Differential/Platelet - Iron, TIBC and Ferritin Panel  10. Long-term use of high-risk medication  - B12 and Folate Panel  11. Sinus pressure  Resume nasal steroid if no improvement by early next week we will send Azithromycin 500 mg to take for 3 days - triamcinolone (NASACORT) 55 MCG/ACT AERO nasal inhaler; Place 2 sprays into the nose daily.

## 2023-03-13 ENCOUNTER — Encounter: Payer: Self-pay | Admitting: Family Medicine

## 2023-03-13 LAB — CBC WITH DIFFERENTIAL/PLATELET
Absolute Lymphocytes: 1182 {cells}/uL (ref 850–3900)
Absolute Monocytes: 624 {cells}/uL (ref 200–950)
Basophils Absolute: 18 {cells}/uL (ref 0–200)
Basophils Relative: 0.3 %
Eosinophils Absolute: 210 {cells}/uL (ref 15–500)
Eosinophils Relative: 3.5 %
HCT: 46.4 % — ABNORMAL HIGH (ref 35.0–45.0)
Hemoglobin: 15.3 g/dL (ref 11.7–15.5)
MCH: 31.2 pg (ref 27.0–33.0)
MCHC: 33 g/dL (ref 32.0–36.0)
MCV: 94.7 fL (ref 80.0–100.0)
MPV: 8.7 fL (ref 7.5–12.5)
Monocytes Relative: 10.4 %
Neutro Abs: 3966 {cells}/uL (ref 1500–7800)
Neutrophils Relative %: 66.1 %
Platelets: 218 10*3/uL (ref 140–400)
RBC: 4.9 10*6/uL (ref 3.80–5.10)
RDW: 12.6 % (ref 11.0–15.0)
Total Lymphocyte: 19.7 %
WBC: 6 10*3/uL (ref 3.8–10.8)

## 2023-03-13 LAB — MICROALBUMIN / CREATININE URINE RATIO
Creatinine, Urine: 133 mg/dL (ref 20–275)
Microalb Creat Ratio: 38 mg/g{creat} — ABNORMAL HIGH (ref <30)
Microalb, Ur: 5.1 mg/dL

## 2023-03-13 LAB — COMPLETE METABOLIC PANEL WITH GFR
AG Ratio: 1.9 (calc) (ref 1.0–2.5)
ALT: 40 U/L — ABNORMAL HIGH (ref 6–29)
AST: 25 U/L (ref 10–35)
Albumin: 5 g/dL (ref 3.6–5.1)
Alkaline phosphatase (APISO): 44 U/L (ref 37–153)
BUN: 21 mg/dL (ref 7–25)
CO2: 30 mmol/L (ref 20–32)
Calcium: 10.2 mg/dL (ref 8.6–10.4)
Chloride: 97 mmol/L — ABNORMAL LOW (ref 98–110)
Creat: 0.79 mg/dL (ref 0.60–1.00)
Globulin: 2.7 g/dL (ref 1.9–3.7)
Glucose, Bld: 200 mg/dL — ABNORMAL HIGH (ref 65–99)
Potassium: 4.1 mmol/L (ref 3.5–5.3)
Sodium: 139 mmol/L (ref 135–146)
Total Bilirubin: 0.6 mg/dL (ref 0.2–1.2)
Total Protein: 7.7 g/dL (ref 6.1–8.1)
eGFR: 78 mL/min/{1.73_m2} (ref >=60)

## 2023-03-13 LAB — LIPID PANEL
Cholesterol: 113 mg/dL (ref <200)
HDL: 37 mg/dL — ABNORMAL LOW (ref >=50)
LDL Cholesterol (Calc): 48 mg/dL
Non-HDL Cholesterol (Calc): 76 mg/dL (ref <130)
Total CHOL/HDL Ratio: 3.1 (calc) (ref <5.0)
Triglycerides: 201 mg/dL — ABNORMAL HIGH (ref <150)

## 2023-03-13 LAB — IRON,TIBC AND FERRITIN PANEL
%SAT: 23 % (ref 16–45)
Ferritin: 56 ng/mL (ref 16–288)
Iron: 86 ug/dL (ref 45–160)
TIBC: 380 ug/dL (ref 250–450)

## 2023-03-13 LAB — B12 AND FOLATE PANEL
Folate: >24 ng/mL
Vitamin B-12: 742 pg/mL (ref 200–1100)

## 2023-03-13 LAB — VITAMIN D 25 HYDROXY (VIT D DEFICIENCY, FRACTURES): Vit D, 25-Hydroxy: 54 ng/mL (ref 30–100)

## 2023-03-26 ENCOUNTER — Ambulatory Visit (INDEPENDENT_AMBULATORY_CARE_PROVIDER_SITE_OTHER): Payer: Medicare HMO

## 2023-03-26 DIAGNOSIS — Z0000 Encounter for general adult medical examination without abnormal findings: Secondary | ICD-10-CM

## 2023-03-26 NOTE — Patient Instructions (Addendum)
Michelle Montgomery , Thank you for taking time to come for your Medicare Wellness Visit. I appreciate your ongoing commitment to your health goals. Please review the following plan we discussed and let me know if I can assist you in the future.   Referrals/Orders/Follow-Ups/Clinician Recommendations: NONE  This is a list of the screening recommended for you and due dates:  Health Maintenance  Topic Date Due   COVID-19 Vaccine (5 - 2024-25 season) 03/28/2023*   Complete foot exam   05/21/2023   Hemoglobin A1C  09/09/2023   Mammogram  10/02/2023   Eye exam for diabetics  11/06/2023   Yearly kidney function blood test for diabetes  03/11/2024   Yearly kidney health urinalysis for diabetes  03/11/2024   Medicare Annual Wellness Visit  03/25/2024   Colon Cancer Screening  10/30/2027   DTaP/Tdap/Td vaccine (3 - Td or Tdap) 10/11/2030   Pneumonia Vaccine  Completed   Flu Shot  Completed   DEXA scan (bone density measurement)  Completed   Hepatitis C Screening  Completed   Zoster (Shingles) Vaccine  Completed   HPV Vaccine  Aged Out  *Topic was postponed. The date shown is not the original due date.    Advanced directives: (ACP Link)Information on Advanced Care Planning can be found at [facility] of [place] Advance Health Care Directives Advance Health Care Directives ([email or web address]   Next Medicare Annual Wellness Visit scheduled for next year: Yes   03/31/24 @ 3:50 PM BY VIDEO

## 2023-03-26 NOTE — Progress Notes (Signed)
Subjective:   Michelle Montgomery is a 73 y.o. female who presents for Medicare Annual (Subsequent) preventive examination.  Visit Complete: Virtual I connected with  Waymond Cera on 03/26/23 by a video and audio enabled telemedicine application and verified that I am speaking with the correct person using two identifiers.  Patient Location: Home  Provider Location: Office/Clinic  I discussed the limitations of evaluation and management by telemedicine. The patient expressed understanding and agreed to proceed.  Vital Signs: Because this visit was a virtual/telehealth visit, some criteria may be missing or patient reported. Any vitals not documented were not able to be obtained and vitals that have been documented are patient reported.  Cardiac Risk Factors include: advanced age (>33men, >20 women);diabetes mellitus;dyslipidemia;hypertension;sedentary lifestyle;obesity (BMI >30kg/m2);microalbuminuria     Objective:    There were no vitals filed for this visit. There is no height or weight on file to calculate BMI.     03/26/2023    3:52 PM 10/30/2022    8:13 AM 03/03/2022    1:44 PM 08/28/2021    6:43 AM 08/19/2021    8:33 AM 04/05/2021    6:17 AM 03/27/2021    9:49 AM  Advanced Directives  Does Patient Have a Medical Advance Directive? No Yes Yes Yes Yes Yes Yes  Type of Furniture conservator/restorer;Living will Healthcare Power of [place];Living will Living will  Living will Healthcare Power of [place];Living will  Does patient want to make changes to medical advance directive?   No - Patient declined No - Patient declined  No - Patient declined   Would patient like information on creating a medical advance directive? No - Patient declined          Current Medications (verified) Outpatient Encounter Medications as of 03/26/2023  Medication Sig   atenolol-chlorthalidone (TENORETIC) 50-25 MG tablet Take 1 tablet by mouth daily.   atorvastatin (LIPITOR) 40  MG tablet Take 1 tablet (40 mg total) by mouth daily.   blood glucose meter kit and supplies Dispense based on patient and insurance preference. Use up to four times daily as directed. (FOR ICD-10 E10.9, E11.9).   Calcium Carb-Cholecalciferol (CALCIUM 600 + D PO) Take 1 tablet by mouth daily.   Calcium Polycarbophil (FIBER-CAPS PO) Take 1 capsule by mouth daily.   cetirizine (ZYRTEC) 10 MG tablet Take 10 mg by mouth daily as needed for allergies.   ferrous sulfate 325 (65 FE) MG tablet Take 325 mg by mouth daily with breakfast.   glucose blood (ONETOUCH VERIO) test strip 1 each by Other route in the morning and at bedtime. Use as instructed   metFORMIN (GLUCOPHAGE-XR) 750 MG 24 hr tablet Take 1 tablet (750 mg total) by mouth 2 (two) times daily.   nateglinide (STARLIX) 60 MG tablet Take 1 tablet (60 mg total) by mouth 3 (three) times daily with meals.   omeprazole (PRILOSEC) 20 MG capsule Take 1 capsule (20 mg total) by mouth daily.   pioglitazone (ACTOS) 30 MG tablet Take 1 tablet (30 mg total) by mouth daily.   psyllium (REGULOID) 0.52 g capsule Take 0.52 g by mouth daily.   triamcinolone (NASACORT) 55 MCG/ACT AERO nasal inhaler Place 2 sprays into the nose daily.   clindamycin (CLEOCIN) 300 MG capsule Take 300 mg by mouth 2 (two) times daily. (Patient not taking: Reported on 03/26/2023)   Suvorexant (BELSOMRA) 10 MG TABS Take 1 tablet (10 mg total) by mouth at bedtime as needed. (Patient not taking: Reported  on 03/26/2023)   No facility-administered encounter medications on file as of 03/26/2023.    Allergies (verified) Codeine, Celecoxib, and Penicillins   History: Past Medical History:  Diagnosis Date   Arthritis    Arthrosis    Bilateral leg cramps    Bilateral sciatica    Cancer (HCC)    basil, sarconoma   Chronic insomnia    Diabetes mellitus without complication (HCC)    Dyslipidemia    Family history of adverse reaction to anesthesia    mom had PONV   GERD (gastroesophageal  reflux disease)    History of colon polyps    History of skin cancer    Dr. Lowella Bandy, history of basal cell and squamous cells carcinoma   Hypertension    Microalbuminuria    100-DM   Obesity    Plantar fasciitis, left    Dr. Al Corpus   Pneumonia    PONV (postoperative nausea and vomiting)    Reflux    Right bundle branch block    Past Surgical History:  Procedure Laterality Date   achiles tendon Left    BIOPSY  10/30/2022   Procedure: BIOPSY;  Surgeon: Jaynie Collins, DO;  Location: [facility] ENDOSCOPY;  Service: Gastroenterology;;   BREAST BIOPSY Left 2015   benign   COLONOSCOPY WITH PROPOFOL N/A 10/30/2022   Procedure: COLONOSCOPY WITH PROPOFOL;  Surgeon: Jaynie Collins, DO;  Location: [facility] ENDOSCOPY;  Service: Gastroenterology;  Laterality: N/A;   ESOPHAGEAL DILATION  10/30/2022   Procedure: ESOPHAGEAL DILATION;  Surgeon: Jaynie Collins, DO;  Location: [facility] ENDOSCOPY;  Service: Gastroenterology;;   ESOPHAGOGASTRODUODENOSCOPY (EGD) WITH PROPOFOL N/A 10/30/2022   Procedure: ESOPHAGOGASTRODUODENOSCOPY (EGD) WITH PROPOFOL;  Surgeon: Jaynie Collins, DO;  Location[facility];  Service: Gastroenterology;  Laterality: N/A;   FOOT SURGERY Left 08/29/2011   Spur Removal , and Achilles Tendon Tendolysis   KNEE ARTHROPLASTY Right 04/05/2021   Procedure: COMPUTER ASSISTED TOTAL KNEE ARTHROPLASTY;  Surgeon: Donato Heinz, MD;  Location: [facility] ORS;  Service: Orthopedics;  Laterality: Right;   KNEE ARTHROPLASTY Left 08/28/2021   Procedure: COMPUTER ASSISTED TOTAL KNEE ARTHROPLASTY;  Surgeon: Donato Heinz, MD;  Location: [facility] ORS;  Service: Orthopedics;  Laterality: Left;   POLYPECTOMY  10/30/2022   Procedure: POLYPECTOMY;  Surgeon: Jaynie Collins, DO;  Location: [facility] ENDOSCOPY;  Service: Gastroenterology;;   VARICOSE VEIN SURGERY Bilateral    Family History  Problem Relation Age of Onset   Cancer Mother        Colon and breast   Anemia Mother     Hypertension Mother    Breast cancer Mother    Cancer Father        Esophageal   Heart disease Father    Diabetes Brother    Cancer Maternal Uncle        Colon   Cancer Maternal Grandmother        Colon   Healthy Brother    Melanoma Brother    Social History   Socioeconomic History   Marital status: Married    Spouse name: Greggory Stallion   Number of children: 2   Years of education: Not on file   Highest education level: 12th grade  Occupational History   Occupation: Retired  Tobacco Use   Smoking status: Former    Current packs/day: 0.00    Average packs/day: 0.3 packs/day for 2.0 years (0.5 ttl pk-yrs)    Types: Cigarettes    Start date: 02/11/1968    Quit date: 02/10/1970  Years since quitting: 53.1   Smokeless tobacco: Never   Tobacco comments:    smoking cessation materials not required  Vaping Use   Vaping status: Never Used  Substance and Sexual Activity   Alcohol use: No    Alcohol/week: 0.0 standard drinks of alcohol   Drug use: No   Sexual activity: Yes  Other Topics Concern   Not on file  Social History Narrative   Not on file   Social Drivers of Health   Financial Resource Strain: Low Risk  (03/26/2023)   Overall Financial Resource Strain (CARDIA)    Difficulty of Paying Living Expenses: Not hard at all  Food Insecurity: No Food Insecurity (03/26/2023)   Hunger Vital Sign    Worried About Running Out of Food in the Last Year: Never true    Ran Out of Food in the Last Year: Never true  Transportation Needs: No Transportation Needs (03/26/2023)   PRAPARE - Administrator, Civil Service (Medical): No    Lack of Transportation (Non-Medical): No  Physical Activity: Insufficiently Active (03/26/2023)   Exercise Vital Sign    Days of Exercise per Week: 2 days    Minutes of Exercise per Session: 20 min  Stress: No Stress Concern Present (03/26/2023)   [employer] of Occupational Health - Occupational Stress Questionnaire    Feeling of Stress :  Not at all  Social Connections: Moderately Integrated (03/26/2023)   Social Connection and Isolation Panel [NHANES]    Frequency of Communication with Friends and Family: More than three times a week    Frequency of Social Gatherings with Friends and Family: Three times a week    Attends Religious Services: More than 4 times per year    Active Member of Clubs or Organizations: No    Attends Banker Meetings: Never    Marital Status: Married    Tobacco Counseling Counseling given: Not Answered Tobacco comments: smoking cessation materials not required   Clinical Intake:  Pre-visit preparation completed: Yes  Pain : No/denies pain     BMI - recorded: 36 Nutritional Status: BMI > 30  Obese Nutritional Risks: None Diabetes: Yes CBG done?: No Did pt. bring in CBG monitor from home?: No  How often do you need to have someone help you when you read instructions, pamphlets, or other written materials from your doctor or pharmacy?: 1 - Never  Interpreter Needed?: No  Information entered by :: Kennedy Bucker, LPN   Activities of Daily Living    03/26/2023    3:55 PM 09/22/2022   10:42 AM  In your present state of health, do you have any difficulty performing the following activities:  Hearing? 0 0  Vision? 0 0  Difficulty concentrating or making decisions? 0 0  Walking or climbing stairs? 0 0  Dressing or bathing? 0 0  Doing errands, shopping? 0 0  Preparing Food and eating ? N   Using the Toilet? N   In the past six months, have you accidently leaked urine? N   Do you have problems with loss of bowel control? N   Managing your Medications? N   Managing your Finances? N   Housekeeping or managing your Housekeeping? N     Patient Care Team: Alba Cory, MD as PCP - General (Family Medicine) Hildred Laser, MD as Consulting Physician (Obstetrics and Gynecology) Nancy Marus, MD as Consulting Physician (Dermatology) Ernest Pine, Illene Labrador, MD (Orthopedic  Surgery) [facility]  Indicate any recent  Medical Services you may have received from other than [facility] providers in the past year (date may be approximate).     Assessment:   This is a routine wellness examination for [place].  Hearing/Vision screen Hearing Screening - Comments:: NO AIDS Vision Screening - Comments:: WEARS GLASSES ALL THE TIME, BIFOCALS- DR.BELL   Goals Addressed             This Visit's Progress    DIET - EAT MORE FRUITS AND VEGETABLES         Depression Screen    03/26/2023    3:44 PM 03/12/2023    9:51 AM 09/22/2022   10:42 AM 08/04/2022    2:43 PM 07/22/2022    1:02 PM 05/27/2022    3:02 PM 05/21/2022    8:55 AM  PHQ 2/9 Scores  PHQ - 2 Score 0 0 0 0 0 0 0  PHQ- 9 Score 0 0 0 0  0 3    Fall Risk    03/26/2023    3:55 PM 03/12/2023    9:51 AM 09/22/2022   10:42 AM 08/04/2022    2:43 PM 07/22/2022    1:02 PM  Fall Risk   Falls in the past year? 0 0 0 0 1  Number falls in past yr: 0 0 0 0 1  Injury with Fall? 0 0 0 0 1  Risk for fall due to : No Fall Risks No Fall Risks No Fall Risks    Follow up Falls prevention discussed;Falls evaluation completed Falls prevention discussed;Education provided;Falls evaluation completed Falls prevention discussed      MEDICARE RISK AT HOME: Medicare Risk at Home Any stairs in or around the home?: No If so, are there any without handrails?: No Home free of loose throw rugs in walkways, pet beds, electrical cords, etc?: Yes Adequate lighting in your home to reduce risk of falls?: Yes Life alert?: No Use of a cane, walker or w/c?: No Grab bars in the bathroom?: Yes Shower chair or bench in shower?: Yes Elevated toilet seat or a handicapped toilet?: Yes  TIMED UP AND GO:  Was the test performed?  No    Cognitive Function:        03/26/2023    3:58 PM 01/14/2022   11:43 AM 08/24/2018   10:01 AM 08/20/2017    9:56 AM  6CIT Screen  What Year? 0 points 0 points 0 points 0 points  What month?  0 points 0 points 0 points 0 points  What time? 0 points 0 points 0 points 0 points  Count back from 20 0 points 0 points 0 points 0 points  Months in reverse 0 points 0 points 0 points 0 points  Repeat phrase 2 points 0 points 0 points 2 points  Total Score 2 points 0 points 0 points 2 points    Immunizations Immunization History  Administered Date(s) Administered   Influenza, High Dose Seasonal PF 11/06/2014, 10/29/2016, 10/30/2018, 11/02/2019, 12/07/2020, 12/02/2021   Influenza,inj,Quad PF,6+ Mos 11/06/2014   Influenza-Unspecified 11/09/2013, 12/13/2015, 12/08/2017, 01/01/2023   PFIZER(Purple Top)SARS-COV-2 Vaccination 03/19/2019, 04/09/2019, 11/08/2019   Pfizer Covid-19 Vaccine Bivalent Booster 61yrs & up 11/06/2020   Pneumococcal Conjugate-13 02/28/2014   Pneumococcal Polysaccharide-23 02/21/2010, 04/10/2015   Rsv, Bivalent, Protein Subunit Rsvpref,pf Verdis Frederickson) 01/01/2023   Tdap 01/07/2010, 10/10/2020   Zoster Recombinant(Shingrix) 09/18/2017, 01/19/2018   Zoster, Live 10/27/2011    TDAP status: Up to date  Flu Vaccine status: Up to date  Pneumococcal vaccine status: Declined,  Education  has been provided regarding the importance of this vaccine but patient still declined. Advised may receive this vaccine at local pharmacy or Health Dept. Aware to provide a copy of the vaccination record if obtained from local pharmacy or Health Dept. Verbalized acceptance and understanding.   Covid-19 vaccine status: Completed vaccines  Qualifies for Shingles Vaccine? Yes   Zostavax completed Yes   Shingrix Completed?: Yes  Screening Tests Health Maintenance  Topic Date Due   COVID-19 Vaccine (5 - 2024-25 season) 03/28/2023 (Originally 10/12/2022)   FOOT EXAM  05/21/2023   HEMOGLOBIN A1C  09/09/2023   MAMMOGRAM  10/02/2023   OPHTHALMOLOGY EXAM  11/06/2023   Diabetic kidney evaluation - eGFR measurement  03/11/2024   Diabetic kidney evaluation - Urine ACR  03/11/2024   Medicare Annual  Wellness (AWV)  03/25/2024   Colonoscopy  10/30/2027   DTaP/Tdap/Td (3 - Td or Tdap) 10/11/2030   Pneumonia Vaccine 49+ Years old  Completed   INFLUENZA VACCINE  Completed   DEXA SCAN  Completed   Hepatitis C Screening  Completed   Zoster Vaccines- Shingrix  Completed   HPV VACCINES  Aged Out    Health Maintenance  There are no preventive care reminders to display for this patient.   Colorectal cancer screening: Type of screening: Colonoscopy. Completed 10/30/22. Repeat every 5 years  Mammogram status: Completed 10/02/22. Repeat every year  Bone Density status: Completed 08/12/21. Results reflect: Bone density results: OSTEOPENIA. Repeat every 5 years.  Lung Cancer Screening: (Low Dose CT Chest recommended if Age 4-80 years, 20 pack-year currently smoking OR have quit w/in 15years.) does not qualify.    Additional Screening:  Hepatitis C Screening: does qualify; Completed 10/27/11  Vision Screening: Recommended annual ophthalmology exams for early detection of glaucoma and other disorders of the eye. Is the patient up to date with their annual eye exam?  Yes  Who is the provider or what is the name of the office in which the patient attends annual eye exams? DR.BELL If pt is not established with a provider, would they like to be referred to a provider to establish care? No .   Dental Screening: Recommended annual dental exams for proper oral hygiene  Diabetic Foot Exam: Diabetic Foot Exam: Completed 05/21/22  Community Resource Referral / Chronic Care Management: CRR required this visit?  No   CCM required this visit?  No     Plan:     I have personally reviewed and noted the following in the patient's chart:   Medical and social history Use of alcohol, tobacco or illicit drugs  Current medications and supplements including opioid prescriptions. Patient is not currently taking opioid prescriptions. Functional ability and status Nutritional status Physical  activity Advanced directives List of other physicians Hospitalizations, surgeries, and ER visits in previous 12 months Vitals Screenings to include cognitive, depression, and falls Referrals and appointments  In addition, I have reviewed and discussed with patient certain preventive protocols, quality metrics, and best practice recommendations. A written personalized care plan for preventive services as well as general preventive health recommendations were provided to patient.     Hal Hope, LPN   [id number]   After Visit Summary: (MyChart) Due to this being a telephonic visit, the after visit summary with patients personalized plan was offered to patient via MyChart   Nurse Notes: NONE

## 2023-07-08 ENCOUNTER — Ambulatory Visit: Payer: Self-pay | Admitting: Family Medicine

## 2023-08-05 ENCOUNTER — Other Ambulatory Visit: Payer: Self-pay | Admitting: Family Medicine

## 2023-08-05 DIAGNOSIS — E785 Hyperlipidemia, unspecified: Secondary | ICD-10-CM

## 2023-08-05 DIAGNOSIS — K219 Gastro-esophageal reflux disease without esophagitis: Secondary | ICD-10-CM

## 2023-08-05 DIAGNOSIS — E1159 Type 2 diabetes mellitus with other circulatory complications: Secondary | ICD-10-CM

## 2023-08-05 DIAGNOSIS — E1121 Type 2 diabetes mellitus with diabetic nephropathy: Secondary | ICD-10-CM

## 2023-08-25 ENCOUNTER — Encounter: Payer: Self-pay | Admitting: Family Medicine

## 2023-08-25 ENCOUNTER — Ambulatory Visit: Attending: Family Medicine

## 2023-08-25 ENCOUNTER — Ambulatory Visit: Admitting: Family Medicine

## 2023-08-25 VITALS — BP 134/76 | HR 92 | Resp 16 | Ht 70.0 in | Wt 252.8 lb

## 2023-08-25 DIAGNOSIS — R001 Bradycardia, unspecified: Secondary | ICD-10-CM

## 2023-08-25 DIAGNOSIS — E1159 Type 2 diabetes mellitus with other circulatory complications: Secondary | ICD-10-CM

## 2023-08-25 DIAGNOSIS — E1121 Type 2 diabetes mellitus with diabetic nephropathy: Secondary | ICD-10-CM

## 2023-08-25 DIAGNOSIS — E785 Hyperlipidemia, unspecified: Secondary | ICD-10-CM

## 2023-08-25 DIAGNOSIS — R0683 Snoring: Secondary | ICD-10-CM | POA: Diagnosis not present

## 2023-08-25 DIAGNOSIS — E1169 Type 2 diabetes mellitus with other specified complication: Secondary | ICD-10-CM | POA: Diagnosis not present

## 2023-08-25 DIAGNOSIS — R0600 Dyspnea, unspecified: Secondary | ICD-10-CM | POA: Diagnosis not present

## 2023-08-25 DIAGNOSIS — I152 Hypertension secondary to endocrine disorders: Secondary | ICD-10-CM

## 2023-08-25 DIAGNOSIS — K219 Gastro-esophageal reflux disease without esophagitis: Secondary | ICD-10-CM

## 2023-08-25 DIAGNOSIS — Z7984 Long term (current) use of oral hypoglycemic drugs: Secondary | ICD-10-CM

## 2023-08-25 LAB — POCT GLYCOSYLATED HEMOGLOBIN (HGB A1C): Hemoglobin A1C: 8.8 % — AB (ref 4.0–5.6)

## 2023-08-25 MED ORDER — ATORVASTATIN CALCIUM 40 MG PO TABS: 40.0000 mg | ORAL_TABLET | Freq: Every day | ORAL | 1 refills | Status: DC

## 2023-08-25 MED ORDER — METFORMIN HCL ER 750 MG PO TB24: 750.0000 mg | ORAL_TABLET | Freq: Two times a day (BID) | ORAL | 1 refills | Status: DC

## 2023-08-25 MED ORDER — CHLORTHALIDONE 25 MG PO TABS: 25.0000 mg | ORAL_TABLET | Freq: Every day | ORAL | 0 refills | Status: DC

## 2023-08-25 MED ORDER — OMEPRAZOLE 20 MG PO CPDR: 20.0000 mg | DELAYED_RELEASE_CAPSULE | Freq: Every day | ORAL | 1 refills | Status: DC

## 2023-08-25 MED ORDER — PIOGLITAZONE HCL 30 MG PO TABS
30.0000 mg | ORAL_TABLET | Freq: Every day | ORAL | 1 refills | Status: DC
Start: 2023-08-25 — End: 2023-08-25

## 2023-08-25 MED ORDER — PIOGLITAZONE HCL 30 MG PO TABS: 30.0000 mg | ORAL_TABLET | Freq: Every day | ORAL | 1 refills | Status: DC

## 2023-08-25 MED ORDER — OMEPRAZOLE 20 MG PO CPDR
20.0000 mg | DELAYED_RELEASE_CAPSULE | Freq: Every day | ORAL | 1 refills | Status: DC
Start: 2023-08-25 — End: 2023-08-25

## 2023-08-25 NOTE — Progress Notes (Signed)
 Name: Michelle Montgomery   MRN: [id number]    DOB: July 14, 1950   Date:08/25/2023       Progress Note  Subjective  Chief Complaint  Chief Complaint  Patient presents with   Medical Management of Chronic Issues   Discussed the use of AI scribe software for clinical note transcription with the patient, who gave verbal consent to proceed.  History of Present Illness Michelle Montgomery is a 73 year old female with hypertension and diabetes who presents with episodes of low pulse and difficulty breathing during sleep.  She experiences episodes of low pulse, with readings of 42-43 bpm, accompanied by lightheadedness, sweating, and difficulty breathing. These episodes occur a couple of times a week, making it difficult for her to walk or reach her car. She previously took 25 mg of atenolol , which she has since stopped due to these symptoms. She has not taken any blood pressure medication since Thursday, August 20, 2023, and notes feeling better without it.  She describes waking up at night unable to breathe, needing to sit up to catch her breath. She snores and feels tired during the day despite sleeping through the night. She has not been formally diagnosed with sleep apnea.  She has a history of diabetes, currently taking metformin  1500 mg daily, not taking Starlix  as prescribed but takes Actos .  Her A1c is 8.8. She takes atorvastatin  for dyslipidemia  and a baby aspirin regularly. She also has proteinuria, under the care of nephrologist .  She has Diabetes, hypertension and obesity syndrome, with a BMI over 35 with co-morbidities therefore considered morbidly obese. She has been trying to lose weight by cutting out breads and eating meats with vegetables, resulting in a 15-pound weight loss.  She experiences stress related to her husband's dementia, which affects his decision-making abilities. She manages her finances and is concerned about his safety and financial decisions.  She takes iron  supplements for iron deficiency anemia, though previous colonoscopy and endoscopy were normal. She also takes Prilosec 20 mg for reflux and Tylenol  PM for sleep.    Patient Active Problem List   Diagnosis Date Noted   Hypertension associated with type 2 diabetes mellitus (HCC) 10/17/2021   Osteopenia after menopause 06/18/2021   Total knee replacement status 04/05/2021   Primary osteoarthritis of left knee 03/11/2021   Varicose veins of both lower extremities 06/12/2016   BPV (benign positional vertigo), right 08/24/2015   Smell disturbance 01/16/2015   Osteoarthritis 11/03/2014   Cramps of lower extremity 11/03/2014   Neuralgia neuritis, sciatic nerve 11/03/2014   Insomnia, persistent 11/03/2014   Dyslipidemia 11/03/2014   Type 2 diabetes with nephropathy (HCC) 11/03/2014   Essential (primary) hypertension 11/03/2014   History of colon polyps 11/03/2014   H/O malignant neoplasm of skin 11/03/2014   Microalbuminuria 11/03/2014   Plantar fasciitis 11/03/2014   Gastro-esophageal reflux disease without esophagitis 11/03/2014   Bundle branch block, right 11/03/2014    Past Surgical History:  Procedure Laterality Date   achiles tendon Left    BIOPSY  10/30/2022   Procedure: BIOPSY;  Surgeon: Onita Elspeth Sharper, DO;  Location: [facility] ENDOSCOPY;  Service: Gastroenterology;;   BREAST BIOPSY Left 2015   benign   COLONOSCOPY WITH PROPOFOL  N/A 10/30/2022   Procedure: COLONOSCOPY WITH PROPOFOL ;  Surgeon: Onita Elspeth Sharper, DO;  Location: [facility] ENDOSCOPY;  Service: Gastroenterology;  Laterality: N/A;   ESOPHAGEAL DILATION  10/30/2022   Procedure: ESOPHAGEAL DILATION;  Surgeon: Onita Elspeth Sharper, DO;  Location: [facility] ENDOSCOPY;  Service:  Gastroenterology;;   ESOPHAGOGASTRODUODENOSCOPY (EGD) WITH PROPOFOL  N/A 10/30/2022   Procedure: ESOPHAGOGASTRODUODENOSCOPY (EGD) WITH PROPOFOL ;  Surgeon: Onita Elspeth Sharper, DO;  Location: [facility] ENDOSCOPY;  Service: Gastroenterology;  Laterality: N/A;    FOOT SURGERY Left 08/29/2011   Spur Removal , and Achilles Tendon Tendolysis   KNEE ARTHROPLASTY Right 04/05/2021   Procedure: COMPUTER ASSISTED TOTAL KNEE ARTHROPLASTY;  Surgeon: Mardee Lynwood SQUIBB, MD;  Location: [facility] ORS;  Service: Orthopedics;  Laterality: Right;   KNEE ARTHROPLASTY Left 08/28/2021   Procedure: COMPUTER ASSISTED TOTAL KNEE ARTHROPLASTY;  Surgeon: Mardee Lynwood SQUIBB, MD;  Location: [facility] ORS;  Service: Orthopedics;  Laterality: Left;   POLYPECTOMY  10/30/2022   Procedure: POLYPECTOMY;  Surgeon: Onita Elspeth Sharper, DO;  Location: [facility] ENDOSCOPY;  Service: Gastroenterology;;   VARICOSE VEIN SURGERY Bilateral     Family History  Problem Relation Age of Onset   Cancer Mother        Colon and breast   Anemia Mother    Hypertension Mother    Breast cancer Mother    Cancer Father        Esophageal   Heart disease Father    Diabetes Brother    Cancer Maternal Uncle        Colon   Cancer Maternal Grandmother        Colon   Healthy Brother    Melanoma Brother     Social History   Tobacco Use   Smoking status: Former    Current packs/day: 0.00    Average packs/day: 0.3 packs/day for 2.0 years (0.5 ttl pk-yrs)    Types: Cigarettes    Start date: 02/11/1968    Quit date: 02/10/1970    Years since quitting: 53.5   Smokeless tobacco: Never   Tobacco comments:    smoking cessation materials not required  Substance Use Topics   Alcohol use: No    Alcohol/week: 0.0 standard drinks of alcohol     Current Outpatient Medications:    blood glucose meter kit and supplies, Dispense based on patient and insurance preference. Use up to four times daily as directed. (FOR ICD-10 E10.9, E11.9)., Disp: 1 each, Rfl: 0   Calcium  Carb-Cholecalciferol  (CALCIUM  600 + D PO), Take 1 tablet by mouth daily., Disp: , Rfl:    Calcium  Polycarbophil (FIBER-CAPS PO), Take 1 capsule by mouth daily., Disp: , Rfl:    cetirizine (ZYRTEC) 10 MG tablet, Take 10 mg by mouth daily as needed for  allergies., Disp: , Rfl:    chlorthalidone  (HYGROTON ) 25 MG tablet, Take 1 tablet (25 mg total) by mouth daily., Disp: 90 tablet, Rfl: 0   ferrous sulfate  325 (65 FE) MG tablet, Take 325 mg by mouth daily with breakfast., Disp: , Rfl:    glucose blood (ONETOUCH VERIO) test strip, 1 each by Other route in the morning and at bedtime. Use as instructed, Disp: 200 each, Rfl: 5   nateglinide  (STARLIX ) 60 MG tablet, Take 1 tablet (60 mg total) by mouth 3 (three) times daily with meals., Disp: 270 tablet, Rfl: 1   psyllium (REGULOID) 0.52 g capsule, Take 0.52 g by mouth daily., Disp: , Rfl:    triamcinolone  (NASACORT ) 55 MCG/ACT AERO nasal inhaler, Place 2 sprays into the nose daily., Disp: , Rfl:    atorvastatin  (LIPITOR) 40 MG tablet, Take 1 tablet (40 mg total) by mouth daily., Disp: 90 tablet, Rfl: 1   metFORMIN  (GLUCOPHAGE -XR) 750 MG 24 hr tablet, Take 1 tablet (750 mg total) by mouth 2 (two) times daily.,  Disp: 180 tablet, Rfl: 1   omeprazole  (PRILOSEC) 20 MG capsule, Take 1 capsule (20 mg total) by mouth daily., Disp: 90 capsule, Rfl: 1   pioglitazone  (ACTOS ) 30 MG tablet, Take 1 tablet (30 mg total) by mouth daily., Disp: 90 tablet, Rfl: 1  Allergies  Allergen Reactions   Codeine Nausea Only    Passed out   Celecoxib  Hives   Penicillins Rash    IgE = 17 (WNL) on 04/02/2021    I personally reviewed active problem list, medication list, allergies with the patient/caregiver today.   ROS  Ten systems reviewed and is negative except as mentioned in HPI    Objective Physical Exam VITALS: P- 113, BP- 150/82 CONSTITUTIONAL: Patient appears well-developed and well-nourished. No distress. HEENT: Head atraumatic, normocephalic, neck supple. CARDIOVASCULAR: Normal rate, regular rhythm and normal heart sounds. No murmur heard. No BLE edema. PULMONARY: Effort normal and breath sounds normal. No respiratory distress. ABDOMINAL: There is no tenderness or distention. MUSCULOSKELETAL: Normal gait.  Without gross motor or sensory deficit. PSYCHIATRIC: Patient has a normal mood and affect. Behavior is normal. Judgment and thought content normal.  Vitals:   08/25/23 1458  BP: (!) 150/82  Pulse: 92  Resp: 16  SpO2: 96%  Weight: 252 lb 12.8 oz (114.7 kg)  Height: 5' 10 (1.778 m)    Body mass index is 36.27 kg/m.  Recent Results (from the past 2160 hours)  POCT glycosylated hemoglobin (Hb A1C)     Status: Abnormal   Collection Time: 08/25/23  3:03 PM  Result Value Ref Range   Hemoglobin A1C 8.8 (A) 4.0 - 5.6 %   HbA1c POC (<> result, manual entry)     HbA1c, POC (prediabetic range)     HbA1c, POC (controlled diabetic range)      Diabetic Foot Exam:  Diabetic foot exam was performed with the following findings:   Normal sensation of 10g monofilament Intact posterior tibialis and dorsalis pedis pulses Thick toenail      PHQ2/9:    08/25/2023    2:48 PM 03/26/2023    3:44 PM 03/12/2023    9:51 AM 09/22/2022   10:42 AM 08/04/2022    2:43 PM  Depression screen PHQ 2/9  Decreased Interest 0 0 0 0 0  Down, Depressed, Hopeless 0 0 0 0 0  PHQ - 2 Score 0 0 0 0 0  Altered sleeping 0 0 0 0 0  Tired, decreased energy 0 0 0 0 0  Change in appetite 0 0 0 0 0  Feeling bad or failure about yourself  0 0 0 0 0  Trouble concentrating 0 0 0 0 0  Moving slowly or fidgety/restless 0 0 0 0 0  Suicidal thoughts 0 0 0 0 0  PHQ-9 Score 0 0 0 0 0  Difficult doing work/chores Not difficult at all Not difficult at all Not difficult at all  Not difficult at all    phq 9 is negative  Fall Risk:    08/25/2023    2:48 PM 03/26/2023    3:55 PM 03/12/2023    9:51 AM 09/22/2022   10:42 AM 08/04/2022    2:43 PM  Fall Risk   Falls in the past year? 0 0 0 0 0  Number falls in past yr: 0 0 0 0 0  Injury with Fall? 0 0 0 0 0  Risk for fall due to : No Fall Risks No Fall Risks No Fall Risks No Fall Risks   Follow  up Falls evaluation completed Falls prevention discussed;Falls evaluation  completed Falls prevention discussed;Education provided;Falls evaluation completed Falls prevention discussed       Assessment & Plan Symptomatic bradycardia Intermittent bradycardia with pulse rates 42-43 bpm, causing lightheadedness, sweating, dyspnea. Possible heart block or arrhythmias. - Discontinue atenolol . - Order Zeopatch for cardiac monitoring. - Refer to cardiologist Dr. Sherrod Shackle.  Hypertension Blood pressure 150/82 mmHg, likely elevated due to stress and lack of medication post-atenolol  discontinuation. - Start chlortalidone. - Send prescription to local pharmacy.  Suspected obstructive sleep apnea Symptoms include waking up gasping, daytime fatigue, snoring. Possible paroxysmal nocturnal dyspnea. - Order sleep study.  Type 2 diabetes mellitus with poor glycemic control and microalbuminuria, dyslipidemia and HTN A1c 8.8%. Inconsistent medication adherence and dietary habits noted. - Continue metformin  750 mg twice daily. - Continue pioglitazone  30 mg daily. - Instruct to take Starlix  before meals. - Educated on medication adherence and dietary management.  Dyslipidemia -continue statin therapy   Morbid Obesity BMI over 35 with DM, HTN and dyslipidemia. Reports dietary changes with some weight loss. - Encourage dietary modifications and weight management.  History of Iron deficiency anemia Iron deficiency anemia with normal colonoscopy and endoscopy. - Continue iron supplementation.  Gastroesophageal reflux disease (GERD) Difficulty swallowing without Prilosec. Managed with Prilosec 20 mg. - Continue Prilosec 20 mg daily.

## 2023-09-05 ENCOUNTER — Other Ambulatory Visit: Payer: Self-pay | Admitting: Family Medicine

## 2023-09-05 DIAGNOSIS — E1159 Type 2 diabetes mellitus with other circulatory complications: Secondary | ICD-10-CM

## 2023-09-05 DIAGNOSIS — E1121 Type 2 diabetes mellitus with diabetic nephropathy: Secondary | ICD-10-CM

## 2023-09-07 NOTE — Telephone Encounter (Signed)
 Requested Prescriptions  Pending Prescriptions Disp Refills   nateglinide  (STARLIX ) 60 MG tablet [Pharmacy Med Name: NATEGLINIDE  60 MG Oral Tablet] 270 tablet 1    Sig: TAKE 1 TABLET THREE TIMES DAILY WITH MEALS     Endocrinology:  Diabetes - Meglitinides Failed - 09/07/2023  1:49 PM      Failed - HBA1C is between 0 and 7.9 and within 180 days    Hemoglobin A1C  Date Value Ref Range Status  08/25/2023 8.8 (A) 4.0 - 5.6 % Final   HbA1c, POC (controlled diabetic range)  Date Value Ref Range Status  05/19/2019 9.3 (A) 0.0 - 7.0 % Final   Hgb A1c MFr Bld  Date Value Ref Range Status  01/13/2022 7.8 (H) <5.7 % of total Hgb Final    Comment:    For someone without known diabetes, a hemoglobin A1c value of 6.5% or greater indicates that they may have  diabetes and this should be confirmed with a follow-up  test. . For someone with known diabetes, a value <7% indicates  that their diabetes is well controlled and a value  greater than or equal to 7% indicates suboptimal  control. A1c targets should be individualized based on  duration of diabetes, age, comorbid conditions, and  other considerations. . Currently, no consensus exists regarding use of hemoglobin A1c for diagnosis of diabetes for children. .          Passed - Cr in normal range and within 360 days    Creat  Date Value Ref Range Status  03/12/2023 0.79 0.60 - 1.00 mg/dL Final   Creatinine, Urine  Date Value Ref Range Status  03/12/2023 133 20 - 275 mg/dL Final         Passed - Valid encounter within last 6 months    Recent Outpatient Visits           1 week ago Type 2 diabetes with nephropathy [facility])   [facility] Sowles, Krichna, MD       Future Appointments             In 1 month Darron, Deatrice LABOR, MD [facility] [place]   In 3 months Sowles, Krichna, MD [facility]

## 2023-09-22 ENCOUNTER — Ambulatory Visit: Attending: Sleep Medicine

## 2023-09-22 DIAGNOSIS — G4733 Obstructive sleep apnea (adult) (pediatric): Secondary | ICD-10-CM | POA: Diagnosis not present

## 2023-09-22 DIAGNOSIS — I493 Ventricular premature depolarization: Secondary | ICD-10-CM | POA: Diagnosis not present

## 2023-09-22 DIAGNOSIS — G478 Other sleep disorders: Secondary | ICD-10-CM | POA: Insufficient documentation

## 2023-09-22 DIAGNOSIS — G4761 Periodic limb movement disorder: Secondary | ICD-10-CM | POA: Diagnosis not present

## 2023-09-22 DIAGNOSIS — R0683 Snoring: Secondary | ICD-10-CM | POA: Diagnosis present

## 2023-09-23 DIAGNOSIS — R001 Bradycardia, unspecified: Secondary | ICD-10-CM | POA: Diagnosis not present

## 2023-09-25 ENCOUNTER — Ambulatory Visit: Payer: Self-pay | Admitting: Family Medicine

## 2023-09-25 DIAGNOSIS — R001 Bradycardia, unspecified: Secondary | ICD-10-CM | POA: Diagnosis not present

## 2023-10-28 ENCOUNTER — Telehealth: Payer: Self-pay | Admitting: *Deleted

## 2023-10-28 NOTE — Telephone Encounter (Signed)
 LMOVM to verify card hx.

## 2023-11-04 ENCOUNTER — Encounter: Payer: Self-pay | Admitting: Cardiovascular Disease

## 2023-11-04 ENCOUNTER — Ambulatory Visit: Attending: Cardiovascular Disease | Admitting: Cardiovascular Disease

## 2023-11-04 VITALS — BP 150/82 | HR 78 | Ht 70.0 in | Wt 248.4 lb

## 2023-11-04 DIAGNOSIS — G4730 Sleep apnea, unspecified: Secondary | ICD-10-CM

## 2023-11-04 DIAGNOSIS — R001 Bradycardia, unspecified: Secondary | ICD-10-CM

## 2023-11-04 DIAGNOSIS — R0602 Shortness of breath: Secondary | ICD-10-CM

## 2023-11-04 DIAGNOSIS — R011 Cardiac murmur, unspecified: Secondary | ICD-10-CM | POA: Diagnosis not present

## 2023-11-04 DIAGNOSIS — I4510 Unspecified right bundle-branch block: Secondary | ICD-10-CM

## 2023-11-04 DIAGNOSIS — I10 Essential (primary) hypertension: Secondary | ICD-10-CM

## 2023-11-04 NOTE — Patient Instructions (Addendum)
 Medication Instructions:  No changes *If you need a refill on your cardiac medications before your next appointment, please call your pharmacy*  Lab Work: None ordered If you have labs (blood work) drawn today and your tests are completely normal, you will receive your results only by: MyChart Message (if you have MyChart) OR A paper copy in the mail If you have any lab test that is abnormal or we need to change your treatment, we will call you to review the results.  Testing/Procedures: Your physician has requested that you have an echocardiogram. Echocardiography is a painless test that uses sound waves to create images of your heart. It provides your doctor with information about the size and shape of your heart and how well your heart's chambers and valves are working.   You may receive an ultrasound enhancing agent through an IV if needed to better visualize your heart during the echo. This procedure takes approximately one hour.  There are no restrictions for this procedure.  This will take place at [address] [facility] [address], [place] [address]  Please note: We ask at that you not bring children with you during ultrasound (echo/ vascular) testing. Due to room size and safety concerns, children are not allowed in the ultrasound rooms during exams. Our front office staff cannot provide observation of children in our lobby area while testing is being conducted. An adult accompanying a patient to their appointment will only be allowed in the ultrasound room at the discretion of the ultrasound technician under special circumstances. We apologize for any inconvenience.  CARDIAC PET- Your physician has requested that you have a Cardiac Pet Stress Test.   This testing is completed at [facility] ([address] [place], [place] [address]) or [facility] ([address] [place], [place]). Please arrive 30 minutes prior to  your scheduled time.  The schedulers will call you to get this scheduled. Please follow further testing instructions below.   Follow-Up: At [facility], you and your health needs are our priority.  As part of our continuing mission to provide you with exceptional heart care, our providers are all part of one team.  This team includes your primary Cardiologist (physician) and Advanced Practice Providers or APPs (Physician Assistants and Nurse Practitioners) who all work together to provide you with the care you need, when you need it.  Your next appointment:   6 week(s)  Provider:   You may see Dr. Darron or one of the following Advanced Practice Providers on your designated Care Team:   Lonni Meager, NP Lesley Maffucci, PA-C Bernardino Bring, PA-C Cadence Blevins, PA-C Tylene Lunch, NP Barnie Hila, NP   A A referral has been placed to pulmonology.  We recommend signing up for the patient portal called MyChart.  Sign up information is provided on this After Visit Summary.  MyChart is used to connect with patients for Virtual Visits (Telemedicine).  Patients are able to view lab/test results, encounter notes, upcoming appointments, etc.  Non-urgent messages can be sent to your provider as well.   To learn more about what you can do with MyChart, go to [email or web address].   Other Instructions    Please report to Radiology at the [facility] Main Entrance 30 minutes early for your test.  [address] [place], [place] [address]  OR   Please report to Radiology at [facility] Main Entrance, medical mall, 30 mins prior to your test.  [address]  [place], [place]  How to Prepare for Your Cardiac PET/CT Stress Test:  Nothing to eat or drink, except water, 3 hours prior to arrival time.  NO caffeine/decaffeinated products, or chocolate 12 hours prior to arrival. (Please note decaffeinated beverages  (teas/coffees) still contain caffeine).  If you have caffeine within 12 hours prior, the test will need to be rescheduled.  Diabetic Preparation: If able to eat breakfast prior to 3 hour fasting, you may take all medications, including your insulin . Do not worry if you miss your breakfast dose of insulin  - start at your next meal. If you do not eat prior to 3 hour fast-Hold all diabetes (oral and insulin ) medications. Patients who wear a continuous glucose monitor MUST remove the device prior to scanning.  You may take your remaining medications with water.  NO perfume, cologne or lotion on chest or abdomen area. FEMALES - Please avoid wearing dresses to this appointment.  Total time is 1 to 2 hours; you may want to bring reading material for the waiting time.  IF YOU THINK YOU MAY BE PREGNANT, OR ARE NURSING PLEASE INFORM THE TECHNOLOGIST.  In preparation for your appointment, medication and supplies will be purchased.  Appointment availability is limited, so if you need to cancel or reschedule, please call the Radiology Department Scheduler at [phone] 24 hours in advance to avoid a cancellation fee of $100.00  What to Expect When you Arrive:  Once you arrive and check in for your appointment, you will be taken to a preparation room within the Radiology Department.  A technologist or Nurse will obtain your medical history, verify that you are correctly prepped for the exam, and explain the procedure.  Afterwards, an IV will be started in your arm and electrodes will be placed on your skin for EKG monitoring during the stress portion of the exam. Then you will be escorted to the PET/CT scanner.  There, staff will get you positioned on the scanner and obtain a blood pressure and EKG.  During the exam, you will continue to be connected to the EKG and blood pressure machines.  A small, safe amount of a radioactive tracer will be injected in your IV to obtain a series of pictures of your heart  along with an injection of a stress agent.    After your Exam:  It is recommended that you eat a meal and drink a caffeinated beverage to counter act any effects of the stress agent.  Drink plenty of fluids for the remainder of the day and urinate frequently for the first couple of hours after the exam.  Your doctor will inform you of your test results within 7-10 business days.  For more information and frequently asked questions, please visit our website: [email or web address]  For questions about your test or how to prepare for your test, please call: Cardiac Imaging Nurse Navigators Office: [phone]

## 2023-11-04 NOTE — Progress Notes (Signed)
 Cardiology Office Note   Date:  11/04/2023   ID:  ORINE GOGA, DOB [birth year], MRN [id number]  PCP:  Glenard Mire, MD  Cardiologist:   Deatrice Cage, MD   Chief Complaint  Patient presents with   New Patient (Initial Visit)    Paroxysmal nocturnal dyspnea no complaints today. Meds reviewed verbally with pt.      History of Present Illness: Michelle Montgomery is a 73 y.o. female who was referred by Dr. Glenard for evaluation of bradycardia and exertional dyspnea.  She has no prior cardiac history.  She has known history of type 2 diabetes, essential hypertension, hyperlipidemia, recently diagnosed sleep apnea and obesity. She had recent issues of bradycardia during the day documented by her smart watch with heart rate in the low 40s with associated shortness of breath.  She was on atenolol  for hypertension which was subsequently stopped due to that.  She had improvement in symptoms after that but she continues to have dyspnea with minimal exertion without chest pain. She had a recent sleep study done which showed evidence of mild sleep apnea. She had a recent outpatient monitor which showed rare PACs and rare PVCs.  She did have short episodes of bradycardia during the day but only lasting few seconds.  No sustained bradycardia.   Past Medical History:  Diagnosis Date   Arthritis    Arthrosis    Bilateral leg cramps    Bilateral sciatica    Cancer (HCC)    basil, [place]   Chronic insomnia    Diabetes mellitus without complication (HCC)    Dyslipidemia    Family history of adverse reaction to anesthesia    mom had PONV   GERD (gastroesophageal reflux disease)    History of colon polyps    History of skin cancer    Dr. Stephane Passy, history of basal cell and squamous cells carcinoma   Hypertension    Microalbuminuria    100-DM   Obesity    Plantar fasciitis, left    Dr. Verta   Pneumonia    PONV (postoperative nausea and vomiting)    Reflux    Right  bundle branch block     Past Surgical History:  Procedure Laterality Date   achiles tendon Left    BIOPSY  10/30/2022   Procedure: BIOPSY;  Surgeon: Onita Elspeth Sharper, DO;  Location: [facility] ENDOSCOPY;  Service: Gastroenterology;;   BREAST BIOPSY Left 2015   benign   COLONOSCOPY WITH PROPOFOL  N/A 10/30/2022   Procedure: COLONOSCOPY WITH PROPOFOL ;  Surgeon: Onita Elspeth Sharper, DO;  Location: [facility] ENDOSCOPY;  Service: Gastroenterology;  Laterality: N/A;   ESOPHAGEAL DILATION  10/30/2022   Procedure: ESOPHAGEAL DILATION;  Surgeon: Onita Elspeth Sharper, DO;  Location: [facility] ENDOSCOPY;  Service: Gastroenterology;;   ESOPHAGOGASTRODUODENOSCOPY (EGD) WITH PROPOFOL  N/A 10/30/2022   Procedure: ESOPHAGOGASTRODUODENOSCOPY (EGD) WITH PROPOFOL ;  Surgeon: Onita Elspeth Sharper, DO;  Location: [facility] ENDOSCOPY;  Service: Gastroenterology;  Laterality: N/A;   FOOT SURGERY Left 08/29/2011   Spur Removal , and Achilles Tendon Tendolysis   KNEE ARTHROPLASTY Right 04/05/2021   Procedure: COMPUTER ASSISTED TOTAL KNEE ARTHROPLASTY;  Surgeon: Mardee Lynwood SQUIBB, MD;  Location: [facility] ORS;  Service: Orthopedics;  Laterality: Right;   KNEE ARTHROPLASTY Left 08/28/2021   Procedure: COMPUTER ASSISTED TOTAL KNEE ARTHROPLASTY;  Surgeon: Mardee Lynwood SQUIBB, MD;  Location: [facility] ORS;  Service: Orthopedics;  Laterality: Left;   POLYPECTOMY  10/30/2022   Procedure: POLYPECTOMY;  Surgeon: Onita Elspeth Sharper, DO;  Location: [facility] ENDOSCOPY;  Service: Gastroenterology;;   VARICOSE VEIN SURGERY Bilateral      Current Outpatient Medications  Medication Sig Dispense Refill   Ascorbic Acid  (VITAMIN C) 1000 MG tablet Take 1,000 mg by mouth daily.     aspirin EC 81 MG tablet Take 81 mg by mouth daily. Swallow whole.     atorvastatin  (LIPITOR) 40 MG tablet Take 1 tablet (40 mg total) by mouth daily. 90 tablet 1   blood glucose meter kit and supplies Dispense based on patient and insurance preference. Use up to four times daily as  directed. (FOR ICD-10 E10.9, E11.9). 1 each 0   Calcium  Carb-Cholecalciferol  (CALCIUM  600 + D PO) Take 1 tablet by mouth daily.     Calcium  Polycarbophil (FIBER-CAPS PO) Take 1 capsule by mouth daily.     cetirizine (ZYRTEC) 10 MG tablet Take 10 mg by mouth daily as needed for allergies.     chlorthalidone  (HYGROTON ) 25 MG tablet Take 1 tablet (25 mg total) by mouth daily. 90 tablet 0   Cholecalciferol  (VITAMIN D3) 50 MCG (2000 UT) CHEW Chew by mouth daily.     CINNAMON PO Take by mouth daily.     Coenzyme Q10 (CO Q 10 PO) Take by mouth daily.     ferrous sulfate  325 (65 FE) MG tablet Take 325 mg by mouth daily with breakfast.     glucose blood (ONETOUCH VERIO) test strip 1 each by Other route in the morning and at bedtime. Use as instructed 200 each 5   metFORMIN  (GLUCOPHAGE -XR) 750 MG 24 hr tablet Take 1 tablet (750 mg total) by mouth 2 (two) times daily. 180 tablet 1   Multiple Vitamin (MULTIVITAMIN) tablet Take 1 tablet by mouth daily.     nateglinide  (STARLIX ) 60 MG tablet TAKE 1 TABLET THREE TIMES DAILY WITH MEALS (Patient taking differently: Take 60 mg by mouth 3 (three) times daily with meals.) 270 tablet 1   NON FORMULARY Joint health 1 tablet daily.     NON FORMULARY PB restore 1 tablet daily.     Omega-3 Fatty Acids (FISH OIL OMEGA-3 PO) Take by mouth daily.     omeprazole  (PRILOSEC) 20 MG capsule Take 1 capsule (20 mg total) by mouth daily. 90 capsule 1   pioglitazone  (ACTOS ) 30 MG tablet Take 1 tablet (30 mg total) by mouth daily. 90 tablet 1   psyllium (REGULOID) 0.52 g capsule Take 0.52 g by mouth daily.     triamcinolone  (NASACORT ) 55 MCG/ACT AERO nasal inhaler Place 2 sprays into the nose daily.     No current facility-administered medications for this visit.    Allergies:   Codeine, Celecoxib , and Penicillins    Social History:  The patient  reports that she quit smoking about 53 years ago. Her smoking use included cigarettes. She started smoking about 55 years ago. She  has a 0.5 pack-year smoking history. She has never used smokeless tobacco. She reports that she does not drink alcohol and does not use drugs.   Family History:  The patient's family history includes Anemia in her mother; Breast cancer in her mother; Cancer in her father, maternal grandmother, maternal uncle, and mother; Diabetes in her brother; Healthy in her brother; Heart disease in her father; Hypertension in her mother; Melanoma in her brother.    ROS:  Please see the history of present illness.   Otherwise, review of systems are positive for none.   All other systems are reviewed and negative.    PHYSICAL EXAM: VS:  BP (!) 150/82 (BP Location: Right Arm, Cuff Size: Large)   Pulse 78   Ht 5' 10 (1.778 m)   Wt 248 lb 6 oz (112.7 kg)   SpO2 99%   BMI 35.64 kg/m  , BMI Body mass index is 35.64 kg/m. GEN: Well nourished, well developed, in no acute distress  HEENT: normal  Neck: no JVD, carotid bruits, or masses Cardiac: RRR; no rubs, or gallops,no edema .  2 out of 6 systolic murmur in the aortic area Respiratory:  clear to auscultation bilaterally, normal work of breathing GI: soft, nontender, nondistended, + BS MS: no deformity or atrophy  Skin: warm and dry, no rash Neuro:  Strength and sensation are intact Psych: euthymic mood, full affect   EKG:  EKG is ordered today. The ekg ordered today demonstrates : Sinus rhythm with 1st degree A-V block Right bundle branch block    Recent Labs: 03/12/2023: ALT 40; BUN 21; Creat 0.79; Hemoglobin 15.3; Platelets 218; Potassium 4.1; Sodium 139    Lipid Panel    Component Value Date/Time   CHOL 113 03/12/2023 1041   CHOL 108 11/06/2014 0921   TRIG 201 (H) 03/12/2023 1041   HDL 37 (L) 03/12/2023 1041   HDL 36 (L) 11/06/2014 0921   CHOLHDL 3.1 03/12/2023 1041   VLDL 29 08/24/2015 0926   LDLCALC 48 03/12/2023 1041      Wt Readings from Last 3 Encounters:  11/04/23 248 lb 6 oz (112.7 kg)  08/25/23 252 lb 12.8 oz (114.7 kg)   03/12/23 251 lb 3.2 oz (113.9 kg)          11/04/2023   10:20 AM  PAD Screen  Previous PAD dx? No  Previous surgical procedure? No  Pain with walking? No  Feet/toe relief with dangling? No  Painful, non-healing ulcers? No  Extremities discolored? No      ASSESSMENT AND PLAN:  1.  Bradycardia: She was symptomatic during the day but improved significantly after stopping atenolol .  She is at high risk for high-grade AV block given underlying first-degree AV block and right bundle branch block.  However, given improvement after stopping atenolol , there is no indication for a pacemaker at this time.  2.  Significant exertional dyspnea: Could be anginal equivalent with her prolonged history of type 2 diabetes.  Recommend evaluation with cardiac PET scan.  She is not able to exercise on a treadmill and given her obesity, PET scan is more optimal than SPECT.  3.  Cardiac murmur: No previous evaluation.  I requested an echocardiogram.  4.  Essential hypertension: I agree with stopping atenolol  due to bradycardia.  She is currently on chlorthalidone .  Consider adding an ARB given that she is diabetic.  5.  Hyperlipidemia: Currently on atorvastatin .  Most recent lipid profile showed an LDL of 48.  6.  Sleep apnea: I reviewed the results of recent sleep study that showed mild sleep apnea.  This could be contributing to some daytime fatigue and bradycardia.  I referred her to pulmonary for evaluation.    Disposition:   FU in 6 weeks.  Signed,  Deatrice Cage, MD  11/04/2023 10:39 AM    [facility]

## 2023-11-09 DIAGNOSIS — E113393 Type 2 diabetes mellitus with moderate nonproliferative diabetic retinopathy without macular edema, bilateral: Secondary | ICD-10-CM | POA: Diagnosis not present

## 2023-11-09 LAB — OPHTHALMOLOGY REPORT-SCANNED

## 2023-11-10 ENCOUNTER — Encounter: Payer: Self-pay | Admitting: Sleep Medicine

## 2023-11-10 ENCOUNTER — Ambulatory Visit: Admitting: Sleep Medicine

## 2023-11-10 VITALS — BP 130/80 | HR 79 | Temp 98.1°F | Ht 70.0 in | Wt 249.8 lb

## 2023-11-10 DIAGNOSIS — E669 Obesity, unspecified: Secondary | ICD-10-CM | POA: Diagnosis not present

## 2023-11-10 DIAGNOSIS — Z6835 Body mass index (BMI) 35.0-35.9, adult: Secondary | ICD-10-CM

## 2023-11-10 DIAGNOSIS — G4733 Obstructive sleep apnea (adult) (pediatric): Secondary | ICD-10-CM | POA: Diagnosis not present

## 2023-11-10 DIAGNOSIS — E118 Type 2 diabetes mellitus with unspecified complications: Secondary | ICD-10-CM

## 2023-11-10 NOTE — Progress Notes (Signed)
 Name:Michelle Montgomery MRN: [id number] DOB: March 23, 1950   CHIEF COMPLAINT:  EXCESSIVE DAYTIME SLEEPINESS   HISTORY OF PRESENT ILLNESS: Michelle Montgomery is a 73 y.o. w/ a h/o DMII, hyperlipidemia, GERD and obesity who presents for c/o loud snoring, witnessed apnea and excessive daytime sleepiness which has been present for several years. Reports nocturnal awakenings due to unclear reasons, however does not have difficulty falling back to sleep. Reports a 20 lb weight gain over the last few years. Admits to dry mouth. Denies morning headaches, RLS symptoms, dream enactment, cataplexy, hypnagogic or hypnapompic hallucinations. Reports a family history of sleep apnea. Denies drowsy driving. Drinks 1/2 cup of coffee and 2 glasses of tea daily, denies tobacco or illicit drug use.   The patient underwent PSG which revealed mild OSA (AHI 9, O2 nadir 81%).   Bedtime 10-10:30 pm Sleep onset 30 mins Rise time 9 am   EPWORTH SLEEP SCORE     08/25/2023    3:00 PM  Results of the Epworth flowsheet  Sitting and reading 3  Watching TV 2  Sitting, inactive in a public place (e.g. a theatre or a meeting) 1  As a passenger in a car for an hour without a break 1  Lying down to rest in the afternoon when circumstances permit 2  Sitting and talking to someone 0  Sitting quietly after a lunch without alcohol 2  In a car, while stopped for a few minutes in traffic 0  Total score 11     PAST MEDICAL HISTORY :   has a past medical history of Arthritis, Arthrosis, Bilateral leg cramps, Bilateral sciatica, Cancer (HCC), Chronic insomnia, Diabetes mellitus without complication (HCC), Dyslipidemia, Family history of adverse reaction to anesthesia, GERD (gastroesophageal reflux disease), History of colon polyps, History of skin cancer, Hypertension, Microalbuminuria, Obesity, Plantar fasciitis, left, Pneumonia, PONV (postoperative nausea and vomiting), Reflux, and Right bundle branch block.  has a past  surgical history that includes Foot surgery (Left, 08/29/2011); Varicose vein surgery (Bilateral); Breast biopsy (Left, 2015); achiles tendon (Left); Knee Arthroplasty (Right, 04/05/2021); Knee Arthroplasty (Left, 08/28/2021); Colonoscopy with propofol  (N/A, 10/30/2022); Esophagogastroduodenoscopy (egd) with propofol  (N/A, 10/30/2022); biopsy (10/30/2022); Esophageal dilation (10/30/2022); and polypectomy (10/30/2022). Prior to Admission medications   Medication Sig Start Date End Date Taking? Authorizing Provider  Ascorbic Acid  (VITAMIN C) 1000 MG tablet Take 1,000 mg by mouth daily.   Yes [provider]  aspirin EC 81 MG tablet Take 81 mg by mouth daily. Swallow whole.   Yes [provider]  atorvastatin  (LIPITOR) 40 MG tablet Take 1 tablet (40 mg total) by mouth daily. 08/25/23  Yes Sowles, Krichna, MD  blood glucose meter kit and supplies Dispense based on patient and insurance preference. Use up to four times daily as directed. (FOR ICD-10 E10.9, E11.9). 03/13/21  Yes Sowles, Krichna, MD  Calcium  Carb-Cholecalciferol  (CALCIUM  600 + D PO) Take 1 tablet by mouth daily.   Yes [provider]  Calcium  Polycarbophil (FIBER-CAPS PO) Take 1 capsule by mouth daily.   Yes [provider]  cetirizine (ZYRTEC) 10 MG tablet Take 10 mg by mouth daily as needed for allergies.   Yes [provider]  chlorthalidone  (HYGROTON ) 25 MG tablet Take 1 tablet (25 mg total) by mouth daily. 08/25/23  Yes Sowles, Krichna, MD  Cholecalciferol  (VITAMIN D3) 50 MCG (2000 UT) CHEW Chew by mouth daily.   Yes [provider]  CINNAMON PO Take by mouth daily.   Yes [provider]  Coenzyme Q10 (CO Q 10 PO) Take by mouth daily.   Yes [provider]  ferrous sulfate  325 (65 FE) MG tablet Take 325 mg by mouth daily with breakfast.   Yes [provider]  glucose blood (ONETOUCH VERIO) test strip 1 each by Other route in the morning and at bedtime. Use as  instructed 03/12/23  Yes Sowles, Krichna, MD  metFORMIN  (GLUCOPHAGE -XR) 750 MG 24 hr tablet Take 1 tablet (750 mg total) by mouth 2 (two) times daily. 08/25/23  Yes Sowles, Krichna, MD  Multiple Vitamin (MULTIVITAMIN) tablet Take 1 tablet by mouth daily.   Yes [provider]  nateglinide  (STARLIX ) 60 MG tablet TAKE 1 TABLET THREE TIMES DAILY WITH MEALS Patient taking differently: Take 60 mg by mouth 3 (three) times daily with meals. 09/07/23  Yes Sowles, Krichna, MD  NON FORMULARY Joint health 1 tablet daily.   Yes [provider]  NON FORMULARY PB restore 1 tablet daily.   Yes [provider]  Omega-3 Fatty Acids (FISH OIL OMEGA-3 PO) Take by mouth daily.   Yes [provider]  omeprazole  (PRILOSEC) 20 MG capsule Take 1 capsule (20 mg total) by mouth daily. 08/25/23  Yes Sowles, Krichna, MD  pioglitazone  (ACTOS ) 30 MG tablet Take 1 tablet (30 mg total) by mouth daily. 08/25/23  Yes Sowles, Krichna, MD  psyllium (REGULOID) 0.52 g capsule Take 0.52 g by mouth daily.   Yes [provider]  triamcinolone  (NASACORT ) 55 MCG/ACT AERO nasal inhaler Place 2 sprays into the nose daily. 03/12/23  Yes Sowles, Krichna, MD   Allergies  Allergen Reactions   Codeine Nausea Only    Passed out   Celecoxib  Hives   Penicillins Rash    IgE = 17 (WNL) on 04/02/2021    FAMILY HISTORY:  family history includes Anemia in her mother; Breast cancer in her mother; Cancer in her father, maternal grandmother, maternal uncle, and mother; Diabetes in her brother; Healthy in her brother; Heart disease in her father; Hypertension in her mother; Melanoma in her brother. SOCIAL HISTORY:  reports that she quit smoking about 53 years ago. Her smoking use included cigarettes. She started smoking about 55 years ago. She has a 0.5 pack-year smoking history. She has never used smokeless tobacco. She reports that she does not drink alcohol and does not use drugs.   Review of Systems:  Gen:   Denies  fever, sweats, chills weight loss  HEENT: Denies blurred vision, double vision, ear pain, eye pain, hearing loss, nose bleeds, sore throat Cardiac:  No dizziness, chest pain or heaviness, chest tightness,edema, No JVD Resp:   No cough, -sputum production, -shortness of breath,-wheezing, -hemoptysis,  Gi: Denies swallowing difficulty, stomach pain, nausea or vomiting, diarrhea, constipation, bowel incontinence Gu:  Denies bladder incontinence, burning urine Ext:   Denies Joint pain, stiffness or swelling Skin: Denies  skin rash, easy bruising or bleeding or hives Endoc:  Denies polyuria, polydipsia , polyphagia or weight change Psych:   Denies depression, insomnia or hallucinations  Other:  All other systems negative  VITAL SIGNS: BP 130/80   Pulse 79   Temp 98.1 F (36.7 C)   Ht 5' 10 (1.778 m)   Wt 249 lb 12.8 oz (113.3 kg)   SpO2 90%   BMI 35.84 kg/m    Physical Examination:   General Appearance: No distress  EYES PERRLA, EOM intact.   NECK Supple, No JVD Pulmonary: normal breath sounds, No wheezing.  CardiovascularNormal S1,S2.  No  m/r/g.   Abdomen: Benign, Soft, non-tender. Skin:   warm, no rashes, no ecchymosis  Extremities: normal, no cyanosis, clubbing. Neuro:without focal findings,  speech normal  PSYCHIATRIC: Mood, affect within normal limits.   ASSESSMENT AND PLAN  OSA Reviewed PSG results with patient, will try patient on APAP therapy set to 4-14 cm H2O. Discussed the consequences of untreated sleep apnea. Advised not to drive drowsy for safety of patient and others. Will follow up in 3 months.     DMII Stable, on current management. Following with PCP.   Obesity Counseled patient on diet and lifestyle modification.    Patient  satisfied with Plan of action and management. All questions answered  I spent a total of 60 minutes reviewing chart data, face-to-face evaluation with the patient, counseling and coordination of care as detailed above.     Vantasia Pinkney, M.D.  Sleep Medicine [facility]

## 2023-11-10 NOTE — Patient Instructions (Signed)

## 2023-11-12 ENCOUNTER — Ambulatory Visit

## 2023-11-19 ENCOUNTER — Telehealth: Payer: Self-pay | Admitting: *Deleted

## 2023-11-19 ENCOUNTER — Other Ambulatory Visit

## 2023-11-19 DIAGNOSIS — R0602 Shortness of breath: Secondary | ICD-10-CM

## 2023-11-19 NOTE — Telephone Encounter (Signed)
 Patient has been made aware that her insurance denied her cardiac pet scan. Per Dr. Darron, a lexiscan has been ordered. The patient is in agreement.   Your provider has ordered a Lexiscan Myoview Stress test. This will take place at [facility]. Please report to the [facility] medical mall entrance. The volunteers at the first desk will direct you where to go.  [facility] MYOVIEW  Your provider has ordered a Stress Test with nuclear imaging. The purpose of this test is to evaluate the blood supply to your heart muscle. This procedure is referred to as a Non-Invasive Stress Test. This is because other than having an IV started in your vein, nothing is inserted or invades your body. Cardiac stress tests are done to find areas of poor blood flow to the heart by determining the extent of coronary artery disease (CAD). Some patients exercise on a treadmill, which naturally increases the blood flow to your heart, while others who are unable to walk on a treadmill due to physical limitations will have a pharmacologic/chemical stress agent called Lexiscan . This medicine will mimic walking on a treadmill by temporarily increasing your coronary blood flow.   Please note: these test may take anywhere between 2-4 hours to complete  How to prepare for your Myoview test:  Nothing to eat for 6 hours prior to the test No caffeine for 24 hours prior to test No smoking 24 hours prior to test. Your medication may be taken with water.  If your doctor stopped a medication because of this test, do not take that medication. Ladies, please do not wear dresses.  Skirts or pants are appropriate. Please wear a short sleeve shirt. No perfume, cologne or lotion. Wear comfortable walking shoes. No heels!   PLEASE NOTIFY THE OFFICE AT LEAST 24 HOURS IN ADVANCE IF YOU ARE UNABLE TO KEEP YOUR APPOINTMENT.  [phone] AND  PLEASE NOTIFY NUCLEAR MEDICINE AT [facility] AT LEAST 24 HOURS IN ADVANCE IF YOU ARE UNABLE TO KEEP YOUR APPOINTMENT.  [phone]

## 2023-11-26 ENCOUNTER — Ambulatory Visit

## 2023-11-30 ENCOUNTER — Other Ambulatory Visit: Payer: Self-pay | Admitting: Physician Assistant

## 2023-11-30 ENCOUNTER — Ambulatory Visit: Payer: Self-pay | Admitting: Cardiovascular Disease

## 2023-11-30 ENCOUNTER — Encounter
Admission: RE | Admit: 2023-11-30 | Discharge: 2023-11-30 | Disposition: A | Discharge status: Home / Self Care | Source: Ambulatory Visit | Attending: Cardiovascular Disease | Admitting: Cardiovascular Disease

## 2023-11-30 DIAGNOSIS — R0602 Shortness of breath: Secondary | ICD-10-CM | POA: Insufficient documentation

## 2023-11-30 LAB — NM MYOCAR MULTI W/SPECT W/WALL MOTION / EF
LV dias vol: 127 mL (ref 46–106)
LV sys vol: 34 mL (ref 3.8–5.2)
MPHR: 146 {beats}/min
Nuc Stress EF: 73 %
Peak HR: 88 {beats}/min
Percent HR: 60 %
Rest HR: 72 {beats}/min
Rest Nuclear Isotope Dose: 9.9 mCi
SDS: 0
SRS: 0
SSS: 2
ST Depression (mm): 0 mm
Stress Nuclear Isotope Dose: 31.6 mCi
TID: 0.73

## 2023-11-30 MED ORDER — REGADENOSON 0.4 MG/5ML IV SOLN
0.4000 mg | Freq: Once | INTRAVENOUS | Status: AC
  Administered 2023-11-30: 0.4 mg via INTRAVENOUS
  Filled 2023-11-30: qty 5

## 2023-11-30 MED ORDER — TECHNETIUM TC 99M TETROFOSMIN IV KIT
10.0000 | PACK | Freq: Once | INTRAVENOUS | Status: AC | PRN
  Administered 2023-11-30: 9.89 via INTRAVENOUS

## 2023-11-30 MED ORDER — TECHNETIUM TC 99M TETROFOSMIN IV KIT
30.0000 | PACK | Freq: Once | INTRAVENOUS | Status: AC | PRN
  Administered 2023-11-30: 31.58 via INTRAVENOUS

## 2023-11-30 NOTE — Progress Notes (Signed)
     Michelle Montgomery presented for a nuclear stress test today.  I Lesley LITTIE Maffucci, PA-C, provided direct supervision and was present during the stress portion of the study today, which was completed without significant symptoms, immediate complications, or acute ST/T changes on ECG.  Stress imaging is pending at this time.  Preliminary ECG findings may be listed in the chart, but the stress test result will not be finalized until perfusion imaging is complete.  Lesley LITTIE Maffucci, PA-C  11/30/2023, 10:02 AM

## 2023-12-01 ENCOUNTER — Encounter: Payer: Self-pay | Admitting: Family Medicine

## 2023-12-10 DIAGNOSIS — L820 Inflamed seborrheic keratosis: Secondary | ICD-10-CM | POA: Diagnosis not present

## 2023-12-10 DIAGNOSIS — L905 Scar conditions and fibrosis of skin: Secondary | ICD-10-CM | POA: Diagnosis not present

## 2023-12-10 DIAGNOSIS — L821 Other seborrheic keratosis: Secondary | ICD-10-CM | POA: Diagnosis not present

## 2023-12-10 DIAGNOSIS — L570 Actinic keratosis: Secondary | ICD-10-CM | POA: Diagnosis not present

## 2023-12-10 DIAGNOSIS — L578 Other skin changes due to chronic exposure to nonionizing radiation: Secondary | ICD-10-CM | POA: Diagnosis not present

## 2023-12-10 DIAGNOSIS — L814 Other melanin hyperpigmentation: Secondary | ICD-10-CM | POA: Diagnosis not present

## 2023-12-15 DIAGNOSIS — H25043 Posterior subcapsular polar age-related cataract, bilateral: Secondary | ICD-10-CM | POA: Diagnosis not present

## 2023-12-15 DIAGNOSIS — H25013 Cortical age-related cataract, bilateral: Secondary | ICD-10-CM | POA: Diagnosis not present

## 2023-12-15 DIAGNOSIS — H2511 Age-related nuclear cataract, right eye: Secondary | ICD-10-CM | POA: Diagnosis not present

## 2023-12-15 DIAGNOSIS — H2513 Age-related nuclear cataract, bilateral: Secondary | ICD-10-CM | POA: Diagnosis not present

## 2023-12-15 DIAGNOSIS — H18413 Arcus senilis, bilateral: Secondary | ICD-10-CM | POA: Diagnosis not present

## 2023-12-24 ENCOUNTER — Other Ambulatory Visit

## 2023-12-29 ENCOUNTER — Ambulatory Visit: Admitting: Family Medicine

## 2024-01-01 ENCOUNTER — Encounter: Payer: Self-pay | Admitting: Cardiovascular Disease

## 2024-01-01 ENCOUNTER — Ambulatory Visit: Attending: Cardiovascular Disease | Admitting: Cardiovascular Disease

## 2024-01-01 VITALS — BP 130/64 | HR 91 | Ht 70.0 in | Wt 247.1 lb

## 2024-01-01 DIAGNOSIS — R0602 Shortness of breath: Secondary | ICD-10-CM | POA: Diagnosis not present

## 2024-01-01 DIAGNOSIS — E785 Hyperlipidemia, unspecified: Secondary | ICD-10-CM

## 2024-01-01 DIAGNOSIS — R001 Bradycardia, unspecified: Secondary | ICD-10-CM | POA: Diagnosis not present

## 2024-01-01 DIAGNOSIS — I10 Essential (primary) hypertension: Secondary | ICD-10-CM

## 2024-01-01 NOTE — Patient Instructions (Signed)
 Medication Instructions:  Your physician recommends that you continue on your current medications as directed. Please refer to the Current Medication list given to you today.    *If you need a refill on your cardiac medications before your next appointment, please call your pharmacy*  Lab Work: No labs ordered today    Testing/Procedures: No test ordered today   Follow-Up: At [facility], you and your health needs are our priority.  As part of our continuing mission to provide you with exceptional heart care, our providers are all part of one team.  This team includes your primary Cardiologist (physician) and Advanced Practice Providers or APPs (Physician Assistants and Nurse Practitioners) who all work together to provide you with the care you need, when you need it.  Your next appointment:   1 year(s)  Provider:   You may see Deatrice Cage, MD or one of the following Advanced Practice Providers on your designated Care Team:   Lonni Meager, NP Lesley Maffucci, PA-C Bernardino Bring, PA-C [place], PA-C Tylene Lunch, NP Barnie Hila, NP

## 2024-01-01 NOTE — Progress Notes (Signed)
 Cardiology Office Note   Date:  01/01/2024   ID:  Michelle Montgomery, DOB [birth year], MRN [id number]  PCP:  Glenard Mire, MD  Cardiologist:   Deatrice Cage, MD   Chief Complaint  Patient presents with   Follow-up    F/u myoview . Meds reviewed verbally with pt.      History of Present Illness: Michelle Montgomery is a 73 y.o. female who is here today for a follow-up visit regarding bradycardia and exertional dyspnea.    She has known history of type 2 diabetes, essential hypertension, hyperlipidemia, recently diagnosed sleep apnea and obesity. She had recent issues of bradycardia during the day documented by her smart watch with heart rate in the low 40s with associated shortness of breath.  She was on atenolol  for hypertension which was subsequently stopped due to that.  She had improvement in symptoms after that but continued to have exertional dyspnea.  She had a recent outpatient monitor which showed rare PACs and rare PVCs.  She did have short episodes of bradycardia during the day but only lasting few seconds.  No sustained bradycardia.  She underwent a Lexiscan  Myoview  that showed no evidence of ischemia with normal ejection fraction.  She was noted to have moderate coronary artery calcifications. She was seen by pulmonary for sleep apnea and is now using CPAP with improvement in symptoms. She has not been using chlorthalidone  for blood pressure as she felt that she was going to the bathroom too much.   Past Medical History:  Diagnosis Date   Arthritis    Arthrosis    Bilateral leg cramps    Bilateral sciatica    Cancer (HCC)    basil, [place]   Chronic insomnia    Diabetes mellitus without complication (HCC)    Dyslipidemia    Family history of adverse reaction to anesthesia    mom had PONV   GERD (gastroesophageal reflux disease)    History of colon polyps    History of skin cancer    Dr. Stephane Passy, history of basal cell and squamous cells carcinoma    Hypertension    Microalbuminuria    100-DM   Obesity    Plantar fasciitis, left    Dr. Verta   Pneumonia    PONV (postoperative nausea and vomiting)    Reflux    Right bundle branch block     Past Surgical History:  Procedure Laterality Date   achiles tendon Left    BIOPSY  10/30/2022   Procedure: BIOPSY;  Surgeon: Onita Elspeth Sharper, DO;  Location: [facility] ENDOSCOPY;  Service: Gastroenterology;;   BREAST BIOPSY Left 2015   benign   COLONOSCOPY WITH PROPOFOL  N/A 10/30/2022   Procedure: COLONOSCOPY WITH PROPOFOL ;  Surgeon: Onita Elspeth Sharper, DO;  Location: [facility] ENDOSCOPY;  Service: Gastroenterology;  Laterality: N/A;   ESOPHAGEAL DILATION  10/30/2022   Procedure: ESOPHAGEAL DILATION;  Surgeon: Onita Elspeth Sharper, DO;  Location: [facility] ENDOSCOPY;  Service: Gastroenterology;;   ESOPHAGOGASTRODUODENOSCOPY (EGD) WITH PROPOFOL  N/A 10/30/2022   Procedure: ESOPHAGOGASTRODUODENOSCOPY (EGD) WITH PROPOFOL ;  Surgeon: Onita Elspeth Sharper, DO;  Location: [facility] ENDOSCOPY;  Service: Gastroenterology;  Laterality: N/A;   FOOT SURGERY Left 08/29/2011   Spur Removal , and Achilles Tendon Tendolysis   KNEE ARTHROPLASTY Right 04/05/2021   Procedure: COMPUTER ASSISTED TOTAL KNEE ARTHROPLASTY;  Surgeon: Mardee Lynwood SQUIBB, MD;  Location: [facility] ORS;  Service: Orthopedics;  Laterality: Right;   KNEE ARTHROPLASTY Left 08/28/2021   Procedure: COMPUTER ASSISTED TOTAL KNEE ARTHROPLASTY;  Surgeon:  Hooten, Lynwood SQUIBB, MD;  Location: [facility] ORS;  Service: Orthopedics;  Laterality: Left;   POLYPECTOMY  10/30/2022   Procedure: POLYPECTOMY;  Surgeon: Onita Elspeth Sharper, DO;  Location: [facility] ENDOSCOPY;  Service: Gastroenterology;;   VARICOSE VEIN SURGERY Bilateral      Current Outpatient Medications  Medication Sig Dispense Refill   Ascorbic Acid  (VITAMIN C) 1000 MG tablet Take 1,000 mg by mouth daily.     aspirin EC 81 MG tablet Take 81 mg by mouth daily. Swallow whole.     atorvastatin  (LIPITOR) 40 MG tablet Take 1  tablet (40 mg total) by mouth daily. 90 tablet 1   blood glucose meter kit and supplies Dispense based on patient and insurance preference. Use up to four times daily as directed. (FOR ICD-10 E10.9, E11.9). 1 each 0   Calcium  Carb-Cholecalciferol  (CALCIUM  600 + D PO) Take 1 tablet by mouth daily.     Calcium  Polycarbophil (FIBER-CAPS PO) Take 1 capsule by mouth daily.     cetirizine (ZYRTEC) 10 MG tablet Take 10 mg by mouth daily as needed for allergies.     chlorthalidone  (HYGROTON ) 25 MG tablet Take 1 tablet (25 mg total) by mouth daily. 90 tablet 0   Cholecalciferol  (VITAMIN D3) 50 MCG (2000 UT) CHEW Chew by mouth daily.     CINNAMON PO Take by mouth daily.     Coenzyme Q10 (CO Q 10 PO) Take by mouth daily.     ferrous sulfate  325 (65 FE) MG tablet Take 325 mg by mouth daily with breakfast.     glucose blood (ONETOUCH VERIO) test strip 1 each by Other route in the morning and at bedtime. Use as instructed 200 each 5   metFORMIN  (GLUCOPHAGE -XR) 750 MG 24 hr tablet Take 1 tablet (750 mg total) by mouth 2 (two) times daily. 180 tablet 1   Multiple Vitamin (MULTIVITAMIN) tablet Take 1 tablet by mouth daily.     nateglinide  (STARLIX ) 60 MG tablet TAKE 1 TABLET THREE TIMES DAILY WITH MEALS (Patient taking differently: Take 60 mg by mouth 3 (three) times daily with meals.) 270 tablet 1   NON FORMULARY Joint health 1 tablet daily.     NON FORMULARY PB restore 1 tablet daily.     Omega-3 Fatty Acids (FISH OIL OMEGA-3 PO) Take by mouth daily.     omeprazole  (PRILOSEC) 20 MG capsule Take 1 capsule (20 mg total) by mouth daily. 90 capsule 1   pioglitazone  (ACTOS ) 30 MG tablet Take 1 tablet (30 mg total) by mouth daily. 90 tablet 1   psyllium (REGULOID) 0.52 g capsule Take 0.52 g by mouth daily.     triamcinolone  (NASACORT ) 55 MCG/ACT AERO nasal inhaler Place 2 sprays into the nose daily.     No current facility-administered medications for this visit.    Allergies:   Codeine, Celecoxib , and  Penicillins    Social History:  The patient  reports that she quit smoking about 53 years ago. Her smoking use included cigarettes. She started smoking about 55 years ago. She has a 0.5 pack-year smoking history. She has never used smokeless tobacco. She reports that she does not drink alcohol and does not use drugs.   Family History:  The patient's family history includes Anemia in her mother; Breast cancer in her mother; Cancer in her father, maternal grandmother, maternal uncle, and mother; Diabetes in her brother; Healthy in her brother; Heart disease in her father; Hypertension in her mother; Melanoma in her brother.    ROS:  Please see the history of present illness.   Otherwise, review of systems are positive for none.   All other systems are reviewed and negative.    PHYSICAL EXAM: VS:  BP 130/64 (BP Location: Left Arm, Patient Position: Sitting, Cuff Size: Large)   Pulse 91   Ht 5' 10 (1.778 m)   Wt 247 lb 2 oz (112.1 kg)   SpO2 94%   BMI 35.46 kg/m  , BMI Body mass index is 35.46 kg/m. GEN: Well nourished, well developed, in no acute distress  HEENT: normal  Neck: no JVD, carotid bruits, or masses Cardiac: RRR; no rubs, or gallops,no edema .  1/ 6 systolic murmur in the aortic area Respiratory:  clear to auscultation bilaterally, normal work of breathing GI: soft, nontender, nondistended, + BS MS: no deformity or atrophy  Skin: warm and dry, no rash Neuro:  Strength and sensation are intact Psych: euthymic mood, full affect   EKG:  EKG is not ordered today. The ekg ordered today demonstrates :     Recent Labs: 03/12/2023: ALT 40; BUN 21; Creat 0.79; Hemoglobin 15.3; Platelets 218; Potassium 4.1; Sodium 139    Lipid Panel    Component Value Date/Time   CHOL 113 03/12/2023 1041   CHOL 108 11/06/2014 0921   TRIG 201 (H) 03/12/2023 1041   HDL 37 (L) 03/12/2023 1041   HDL 36 (L) 11/06/2014 0921   CHOLHDL 3.1 03/12/2023 1041   VLDL 29 08/24/2015 0926   LDLCALC  48 03/12/2023 1041      Wt Readings from Last 3 Encounters:  01/01/24 247 lb 2 oz (112.1 kg)  11/10/23 249 lb 12.8 oz (113.3 kg)  11/04/23 248 lb 6 oz (112.7 kg)          11/04/2023   10:20 AM  PAD Screen  Previous PAD dx? No  Previous surgical procedure? No  Pain with walking? No  Feet/toe relief with dangling? No  Painful, non-healing ulcers? No  Extremities discolored? No      ASSESSMENT AND PLAN:  1.  Bradycardia:  She is at high risk for high-grade AV block given underlying first-degree AV block and right bundle branch block.  Her symptoms improved after atenolol  was stopped.  I recommend avoiding any medication that can cause bradycardia.  2.  Exertional dyspnea: Lexiscan  Myoview  showed no evidence of ischemia with normal ejection fraction.  She reports improvement in symptoms  3.  Cardiac murmur: Consistent with aortic sclerosis.  Will monitor clinically for now.  4.  Essential hypertension: She stopped taking chlorthalidone  due to frequent urination.  Her blood pressure seems to be controlled without it but given that she is diabetic, consider treatment with an ARB.  5.  Hyperlipidemia: Currently on atorvastatin .  Most recent lipid profile showed an LDL of 48.  6.  Sleep apnea: She was started on CPAP recently.  7.  Coronary artery calcifications: Noted on CT imaging.  Continue treatment of risk factors.   Disposition:   FU in 12 months.  Signed,  Deatrice Cage, MD  01/01/2024 4:06 PM    [facility]

## 2024-01-31 ENCOUNTER — Other Ambulatory Visit: Payer: Self-pay | Admitting: Family Medicine

## 2024-01-31 DIAGNOSIS — E1121 Type 2 diabetes mellitus with diabetic nephropathy: Secondary | ICD-10-CM

## 2024-01-31 DIAGNOSIS — I152 Hypertension secondary to endocrine disorders: Secondary | ICD-10-CM

## 2024-02-03 NOTE — Telephone Encounter (Signed)
 Requested Prescriptions  Pending Prescriptions Disp Refills   nateglinide  (STARLIX ) 60 MG tablet [Pharmacy Med Name: NATEGLINIDE  60 MG Oral Tablet] 270 tablet 0    Sig: Take 1 tablet (60 mg total) by mouth 3 (three) times daily with meals.     Endocrinology:  Diabetes - Meglitinides Failed - 02/03/2024 10:49 AM      Failed - HBA1C is between 0 and 7.9 and within 180 days    Hemoglobin A1C  Date Value Ref Range Status  08/25/2023 8.8 (A) 4.0 - 5.6 % Final   HbA1c, POC (controlled diabetic range)  Date Value Ref Range Status  05/19/2019 9.3 (A) 0.0 - 7.0 % Final   Hgb A1c MFr Bld  Date Value Ref Range Status  01/13/2022 7.8 (H) <5.7 % of total Hgb Final    Comment:    For someone without known diabetes, a hemoglobin A1c value of 6.5% or greater indicates that they may have  diabetes and this should be confirmed with a follow-up  test. . For someone with known diabetes, a value <7% indicates  that their diabetes is well controlled and a value  greater than or equal to 7% indicates suboptimal  control. A1c targets should be individualized based on  duration of diabetes, age, comorbid conditions, and  other considerations. . Currently, no consensus exists regarding use of hemoglobin A1c for diagnosis of diabetes for children. .          Passed - Cr in normal range and within 360 days    Creat  Date Value Ref Range Status  03/12/2023 0.79 0.60 - 1.00 mg/dL Final   Creatinine, Urine  Date Value Ref Range Status  03/12/2023 133 20 - 275 mg/dL Final         Passed - Valid encounter within last 6 months    Recent Outpatient Visits           5 months ago Type 2 diabetes with nephropathy [facility])   [facility] Sowles, Krichna, MD

## 2024-02-08 ENCOUNTER — Encounter: Payer: Self-pay | Admitting: Sleep Medicine

## 2024-02-08 ENCOUNTER — Ambulatory Visit: Admitting: Sleep Medicine

## 2024-02-08 VITALS — BP 130/80 | HR 71 | Temp 99.2°F | Ht 70.0 in | Wt 248.2 lb

## 2024-02-08 DIAGNOSIS — Z6835 Body mass index (BMI) 35.0-35.9, adult: Secondary | ICD-10-CM

## 2024-02-08 DIAGNOSIS — Z7984 Long term (current) use of oral hypoglycemic drugs: Secondary | ICD-10-CM | POA: Diagnosis not present

## 2024-02-08 DIAGNOSIS — Z9989 Dependence on other enabling machines and devices: Secondary | ICD-10-CM

## 2024-02-08 DIAGNOSIS — E119 Type 2 diabetes mellitus without complications: Secondary | ICD-10-CM | POA: Diagnosis not present

## 2024-02-08 DIAGNOSIS — E669 Obesity, unspecified: Secondary | ICD-10-CM

## 2024-02-08 DIAGNOSIS — G4733 Obstructive sleep apnea (adult) (pediatric): Secondary | ICD-10-CM | POA: Diagnosis not present

## 2024-02-08 DIAGNOSIS — Z713 Dietary counseling and surveillance: Secondary | ICD-10-CM | POA: Diagnosis not present

## 2024-02-08 DIAGNOSIS — E118 Type 2 diabetes mellitus with unspecified complications: Secondary | ICD-10-CM

## 2024-02-08 NOTE — Progress Notes (Signed)
 "      Name:Michelle Montgomery MRN: [id number] DOB: 1950/12/17   CHIEF COMPLAINT:  CPAP F/U   HISTORY OF PRESENT ILLNESS: Ms. Diluzio is a 73 y.o. w/ a h/o OSA, DMII, hyperlipidemia, GERD and obesity who presents for CPAP follow up visit. Reports using CPAP therapy every night, which is confirmed by compliance data. Reports feeling more refreshed upon awakening with CPAP therapy. She is currently using the Airfit F40 FFM, which is comfortable. Reports intermittent mask leaks.    EPWORTH SLEEP SCORE     11/10/2023    9:00 AM 08/25/2023    3:00 PM  Results of the Epworth flowsheet  Sitting and reading 3 3  Watching TV 3 2  Sitting, inactive in a public place (e.g. a theatre or a meeting) 1 1  As a passenger in a car for an hour without a break 1 1  Lying down to rest in the afternoon when circumstances permit 3 2  Sitting and talking to someone 0 0  Sitting quietly after a lunch without alcohol 0 2  In a car, while stopped for a few minutes in traffic 0 0  Total score 11 11    PAST MEDICAL HISTORY :   has a past medical history of Arthritis, Arthrosis, Bilateral leg cramps, Bilateral sciatica, Cancer (HCC), Chronic insomnia, Diabetes mellitus without complication (HCC), Dyslipidemia, Family history of adverse reaction to anesthesia, GERD (gastroesophageal reflux disease), History of colon polyps, History of skin cancer, Hypertension, Microalbuminuria, Obesity, Plantar fasciitis, left, Pneumonia, PONV (postoperative nausea and vomiting), Reflux, and Right bundle branch block.  has a past surgical history that includes Foot surgery (Left, 08/29/2011); Varicose vein surgery (Bilateral); Breast biopsy (Left, 2015); achiles tendon (Left); Knee Arthroplasty (Right, 04/05/2021); Knee Arthroplasty (Left, 08/28/2021); Colonoscopy with propofol  (N/A, 10/30/2022); Esophagogastroduodenoscopy (egd) with propofol  (N/A, 10/30/2022); biopsy (10/30/2022); Esophageal dilation (10/30/2022); and polypectomy  (10/30/2022). Prior to Admission medications   Medication Sig Start Date End Date Taking? Authorizing Provider  Ascorbic Acid  (VITAMIN C) 1000 MG tablet Take 1,000 mg by mouth daily.   Yes [provider]  aspirin EC 81 MG tablet Take 81 mg by mouth daily. Swallow whole.   Yes [provider]  atorvastatin  (LIPITOR) 40 MG tablet Take 1 tablet (40 mg total) by mouth daily. 08/25/23  Yes Sowles, Krichna, MD  blood glucose meter kit and supplies Dispense based on patient and insurance preference. Use up to four times daily as directed. (FOR ICD-10 E10.9, E11.9). 03/13/21  Yes Sowles, Krichna, MD  Calcium  Carb-Cholecalciferol  (CALCIUM  600 + D PO) Take 1 tablet by mouth daily.   Yes [provider]  Calcium  Polycarbophil (FIBER-CAPS PO) Take 1 capsule by mouth daily.   Yes [provider]  cetirizine (ZYRTEC) 10 MG tablet Take 10 mg by mouth daily as needed for allergies.   Yes [provider]  chlorthalidone  (HYGROTON ) 25 MG tablet Take 1 tablet (25 mg total) by mouth daily. 08/25/23  Yes Sowles, Krichna, MD  Cholecalciferol  (VITAMIN D3) 50 MCG (2000 UT) CHEW Chew by mouth daily.   Yes [provider]  CINNAMON PO Take by mouth daily.   Yes [provider]  Coenzyme Q10 (CO Q 10 PO) Take by mouth daily.   Yes [provider]  ferrous sulfate  325 (65 FE) MG tablet Take 325 mg by mouth daily with breakfast.   Yes [provider]  glucose blood (ONETOUCH VERIO) test strip 1 each by Other route in the morning and at bedtime.  Use as instructed 03/12/23  Yes Sowles, Krichna, MD  metFORMIN  (GLUCOPHAGE -XR) 750 MG 24 hr tablet Take 1 tablet (750 mg total) by mouth 2 (two) times daily. 08/25/23  Yes Sowles, Krichna, MD  Multiple Vitamin (MULTIVITAMIN) tablet Take 1 tablet by mouth daily.   Yes [provider]  nateglinide  (STARLIX ) 60 MG tablet TAKE 1 TABLET THREE TIMES DAILY WITH MEALS Patient taking differently: Take 60 mg by  mouth 3 (three) times daily with meals. 09/07/23  Yes Sowles, Krichna, MD  NON FORMULARY Joint health 1 tablet daily.   Yes [provider]  NON FORMULARY PB restore 1 tablet daily.   Yes [provider]  Omega-3 Fatty Acids (FISH OIL OMEGA-3 PO) Take by mouth daily.   Yes [provider]  omeprazole  (PRILOSEC) 20 MG capsule Take 1 capsule (20 mg total) by mouth daily. 08/25/23  Yes Sowles, Krichna, MD  pioglitazone  (ACTOS ) 30 MG tablet Take 1 tablet (30 mg total) by mouth daily. 08/25/23  Yes Sowles, Krichna, MD  psyllium (REGULOID) 0.52 g capsule Take 0.52 g by mouth daily.   Yes [provider]  triamcinolone  (NASACORT ) 55 MCG/ACT AERO nasal inhaler Place 2 sprays into the nose daily. 03/12/23  Yes Sowles, Krichna, MD   Allergies  Allergen Reactions   Codeine Nausea Only    Passed out   Celecoxib  Hives   Penicillins Rash    IgE = 17 (WNL) on 04/02/2021    FAMILY HISTORY:  family history includes Anemia in her mother; Breast cancer in her mother; Cancer in her father, maternal grandmother, maternal uncle, and mother; Diabetes in her brother; Healthy in her brother; Heart disease in her father; Hypertension in her mother; Melanoma in her brother. SOCIAL HISTORY:  reports that she quit smoking about 54 years ago. Her smoking use included cigarettes. She started smoking about 56 years ago. She has a 0.5 pack-year smoking history. She has never used smokeless tobacco. She reports that she does not drink alcohol and does not use drugs.   Review of Systems:  Gen:  Denies  fever, sweats, chills weight loss  HEENT: Denies blurred vision, double vision, ear pain, eye pain, hearing loss, nose bleeds, sore throat Cardiac:  No dizziness, chest pain or heaviness, chest tightness,edema, No JVD Resp:   No cough, -sputum production, -shortness of breath,-wheezing, -hemoptysis,  Gi: Denies swallowing difficulty, stomach pain, nausea or vomiting, diarrhea, constipation,  bowel incontinence Gu:  Denies bladder incontinence, burning urine Ext:   Denies Joint pain, stiffness or swelling Skin: Denies  skin rash, easy bruising or bleeding or hives Endoc:  Denies polyuria, polydipsia , polyphagia or weight change Psych:   Denies depression, insomnia or hallucinations  Other:  All other systems negative  VITAL SIGNS: BP 130/80   Pulse 71   Temp 99.2 F (37.3 C)   Ht 5' 10 (1.778 m)   Wt 248 lb 3.2 oz (112.6 kg)   SpO2 92%   BMI 35.61 kg/m    Physical Examination:   General Appearance: No distress  EYES PERRLA, EOM intact.   NECK Supple, No JVD Pulmonary: normal breath sounds, No wheezing.  CardiovascularNormal S1,S2.  No m/r/g.   Abdomen: Benign, Soft, non-tender. Skin:   warm, no rashes, no ecchymosis  Extremities: normal, no cyanosis, clubbing. Neuro:without focal findings,  speech normal  PSYCHIATRIC: Mood, affect within normal limits.   ASSESSMENT AND PLAN  OSA Patient is using and benefiting from CPAP therapy. Counseled patient on proper mask fit. Discussed the consequences  of untreated sleep apnea. Advised not to drive drowsy for safety of patient and others. Will follow up in 3 months.     DMII Stable, on current management. Following with PCP.   Obesity Counseled patient on diet and lifestyle modification.    Patient  satisfied with Plan of action and management. All questions answered  I spent a total of 21 minutes reviewing chart data, face-to-face evaluation with the patient, counseling and coordination of care as detailed above.    Amanii Snethen, M.D.  Sleep Medicine [facility]        "

## 2024-02-09 ENCOUNTER — Encounter: Payer: Self-pay | Admitting: Family Medicine

## 2024-02-09 ENCOUNTER — Ambulatory Visit (INDEPENDENT_AMBULATORY_CARE_PROVIDER_SITE_OTHER): Admitting: Family Medicine

## 2024-02-09 VITALS — BP 136/82 | HR 73 | Resp 16 | Ht 70.0 in | Wt 247.3 lb

## 2024-02-09 DIAGNOSIS — E1121 Type 2 diabetes mellitus with diabetic nephropathy: Secondary | ICD-10-CM

## 2024-02-09 DIAGNOSIS — E66812 Obesity, class 2: Secondary | ICD-10-CM | POA: Diagnosis not present

## 2024-02-09 DIAGNOSIS — Z794 Long term (current) use of insulin: Secondary | ICD-10-CM | POA: Diagnosis not present

## 2024-02-09 DIAGNOSIS — G4733 Obstructive sleep apnea (adult) (pediatric): Secondary | ICD-10-CM

## 2024-02-09 DIAGNOSIS — K219 Gastro-esophageal reflux disease without esophagitis: Secondary | ICD-10-CM

## 2024-02-09 DIAGNOSIS — I10 Essential (primary) hypertension: Secondary | ICD-10-CM | POA: Diagnosis not present

## 2024-02-09 DIAGNOSIS — Z1231 Encounter for screening mammogram for malignant neoplasm of breast: Secondary | ICD-10-CM

## 2024-02-09 DIAGNOSIS — R001 Bradycardia, unspecified: Secondary | ICD-10-CM | POA: Diagnosis not present

## 2024-02-09 DIAGNOSIS — Z79899 Other long term (current) drug therapy: Secondary | ICD-10-CM | POA: Diagnosis not present

## 2024-02-09 DIAGNOSIS — E119 Type 2 diabetes mellitus without complications: Secondary | ICD-10-CM

## 2024-02-09 DIAGNOSIS — Z96653 Presence of artificial knee joint, bilateral: Secondary | ICD-10-CM | POA: Diagnosis not present

## 2024-02-09 DIAGNOSIS — E1169 Type 2 diabetes mellitus with other specified complication: Secondary | ICD-10-CM

## 2024-02-09 DIAGNOSIS — E785 Hyperlipidemia, unspecified: Secondary | ICD-10-CM | POA: Diagnosis not present

## 2024-02-09 LAB — POCT GLYCOSYLATED HEMOGLOBIN (HGB A1C): Hemoglobin A1C: 9.7 % — AB (ref 4.0–5.6)

## 2024-02-09 MED ORDER — INSULIN PEN NEEDLE 29G X 8MM MISC: 1.0000 | Freq: Every day | 1 refills | Status: AC

## 2024-02-09 MED ORDER — ATORVASTATIN CALCIUM 40 MG PO TABS: 40.0000 mg | ORAL_TABLET | Freq: Every day | ORAL | 1 refills | Status: AC

## 2024-02-09 MED ORDER — LOSARTAN POTASSIUM 50 MG PO TABS: 50.0000 mg | ORAL_TABLET | Freq: Every day | ORAL | 1 refills | Status: AC

## 2024-02-09 MED ORDER — OMEPRAZOLE 20 MG PO CPDR: 20.0000 mg | DELAYED_RELEASE_CAPSULE | Freq: Every day | ORAL | 1 refills | Status: AC

## 2024-02-09 MED ORDER — SOLIQUA 100-33 UNT-MCG/ML ~~LOC~~ SOPN: 10.0000 [IU] | PEN_INJECTOR | Freq: Every day | SUBCUTANEOUS | 0 refills | Status: DC

## 2024-02-09 MED ORDER — PIOGLITAZONE HCL 30 MG PO TABS: 30.0000 mg | ORAL_TABLET | Freq: Every day | ORAL | 1 refills | Status: AC

## 2024-02-09 MED ORDER — METFORMIN HCL ER 750 MG PO TB24: 750.0000 mg | ORAL_TABLET | Freq: Two times a day (BID) | ORAL | 1 refills | Status: AC

## 2024-02-09 NOTE — Progress Notes (Signed)
 Name: Michelle Montgomery   MRN: [id number]    DOB: 08/18/1950   Date:02/09/2024       Progress Note  Subjective  Chief Complaint  Chief Complaint  Patient presents with   Medical Management of Chronic Issues   Discussed the use of AI scribe software for clinical note transcription with the patient, who gave verbal consent to proceed.  History of Present Illness Michelle Montgomery is a 73 year old female with diabetes mellitus who presents with uncontrolled blood sugar levels.  Her blood sugar levels have been uncontrolled, with her A1c increasing from 7.6% in April of last year to 9.7% currently. She attributes this to dietary indiscretions during the holiday season, including her birthday, Thanksgiving, and Christmas. She has been taking metformin  750 mg twice daily, Starlix  before meals, and pioglitazone , but her regimen has not been effective in controlling her blood sugar.  She has obesity, hypertension, nephropathy, and dyslipidemia. Her blood pressure readings at home are typically around 130/80 mmHg. She has stopped taking Chlorthalidone   due to frequent urination. No heartburn or indigestion as long as she takes Prilosec.  She uses a CPAP machine for sleep apnea, which has significantly improved her energy levels and reduced her daytime fatigue. She feels 'like a different person' since starting the CPAP, no longer needing naps and waking up ready to go.  She has a history of shortness of breath with activity, for which she underwent a perfusion scan with Dr. Liberty, which was normal. She continues to see Dr. Liberty for her heart health and takes atorvastatin  for dyslipidemia.  Her social history includes a long-term marriage of 53 years and a recent interest in downsizing her home. She is actively involved in managing her household and is considering moving to a condo for easier maintenance.    Patient Active Problem List   Diagnosis Date Noted   Hypertension associated with type 2  diabetes mellitus (HCC) 10/17/2021   Osteopenia after menopause 06/18/2021   Total knee replacement status 04/05/2021   Primary osteoarthritis of left knee 03/11/2021   Varicose veins of both lower extremities 06/12/2016   BPV (benign positional vertigo), right 08/24/2015   Smell disturbance 01/16/2015   Osteoarthritis 11/03/2014   Cramps of lower extremity 11/03/2014   Neuralgia neuritis, sciatic nerve 11/03/2014   Insomnia, persistent 11/03/2014   Dyslipidemia 11/03/2014   Type 2 diabetes with nephropathy (HCC) 11/03/2014   Essential (primary) hypertension 11/03/2014   History of colon polyps 11/03/2014   H/O malignant neoplasm of skin 11/03/2014   Microalbuminuria 11/03/2014   Plantar fasciitis 11/03/2014   Gastro-esophageal reflux disease without esophagitis 11/03/2014   Bundle branch block, right 11/03/2014    Past Surgical History:  Procedure Laterality Date   achiles tendon Left    BIOPSY  10/30/2022   Procedure: BIOPSY;  Surgeon: Onita Elspeth Sharper, DO;  Location: [facility] ENDOSCOPY;  Service: Gastroenterology;;   BREAST BIOPSY Left 2015   benign   COLONOSCOPY WITH PROPOFOL  N/A 10/30/2022   Procedure: COLONOSCOPY WITH PROPOFOL ;  Surgeon: Onita Elspeth Sharper, DO;  Location: [facility] ENDOSCOPY;  Service: Gastroenterology;  Laterality: N/A;   ESOPHAGEAL DILATION  10/30/2022   Procedure: ESOPHAGEAL DILATION;  Surgeon: Onita Elspeth Sharper, DO;  Location: [facility] ENDOSCOPY;  Service: Gastroenterology;;   ESOPHAGOGASTRODUODENOSCOPY (EGD) WITH PROPOFOL  N/A 10/30/2022   Procedure: ESOPHAGOGASTRODUODENOSCOPY (EGD) WITH PROPOFOL ;  Surgeon: Onita Elspeth Sharper, DO;  Location: [facility] ENDOSCOPY;  Service: Gastroenterology;  Laterality: N/A;   FOOT SURGERY Left 08/29/2011   Spur Removal , and  Achilles Tendon Tendolysis   KNEE ARTHROPLASTY Right 04/05/2021   Procedure: COMPUTER ASSISTED TOTAL KNEE ARTHROPLASTY;  Surgeon: Mardee Lynwood SQUIBB, MD;  Location: [facility] ORS;  Service: Orthopedics;   Laterality: Right;   KNEE ARTHROPLASTY Left 08/28/2021   Procedure: COMPUTER ASSISTED TOTAL KNEE ARTHROPLASTY;  Surgeon: Mardee Lynwood SQUIBB, MD;  Location: [facility] ORS;  Service: Orthopedics;  Laterality: Left;   POLYPECTOMY  10/30/2022   Procedure: POLYPECTOMY;  Surgeon: Onita Elspeth Sharper, DO;  Location: [facility] ENDOSCOPY;  Service: Gastroenterology;;   VARICOSE VEIN SURGERY Bilateral     Family History  Problem Relation Age of Onset   Cancer Mother        Colon and breast   Anemia Mother    Hypertension Mother    Breast cancer Mother    Cancer Father        Esophageal   Heart disease Father    Diabetes Brother    Healthy Brother    Melanoma Brother    Cancer Maternal Uncle        Colon   Cancer Maternal Grandmother        Colon    Social History   Tobacco Use   Smoking status: Former    Current packs/day: 0.00    Average packs/day: 0.3 packs/day for 2.0 years (0.5 ttl pk-yrs)    Types: Cigarettes    Start date: 02/11/1968    Quit date: 02/10/1970    Years since quitting: 54.0   Smokeless tobacco: Never   Tobacco comments:    smoking cessation materials not required  Substance Use Topics   Alcohol use: No    Alcohol/week: 0.0 standard drinks of alcohol    Current Medications[1]  Allergies[2]  I personally reviewed active problem list, medication list, allergies, family history with the patient/caregiver today.   ROS  Ten systems reviewed and is negative except as mentioned in HPI    Objective Physical Exam MEASUREMENTS: BMI- 35.0. CONSTITUTIONAL: Patient appears well-developed and well-nourished.  No distress. HEENT: Head atraumatic, normocephalic, neck supple. CARDIOVASCULAR: Normal rate, regular rhythm and normal heart sounds.  No murmur heard. No BLE edema. PULMONARY: Effort normal and breath sounds normal. No respiratory distress. ABDOMINAL: There is no tenderness or distention. MUSCULOSKELETAL: Normal gait. Without gross motor or sensory  deficit. PSYCHIATRIC: Patient has a normal mood and affect. behavior is normal. Judgment and thought content normal.  Vitals:   02/09/24 0838  BP: 136/82  Pulse: 73  Resp: 16  SpO2: 94%  Weight: 247 lb 4.8 oz (112.2 kg)  Height: 5' 10 (1.778 m)    Body mass index is 35.48 kg/m.  Recent Results (from the past 2160 hours)  NM Myocar Multi W/Spect W/Wall Motion / EF     Status: None   Collection Time: 11/30/23 12:17 PM  Result Value Ref Range   Rest HR 72.0 bpm   Rest BP 159/90 mmHg   Peak HR 88 bpm   Peak BP 171/82 mmHg   MPHR 146 bpm   Percent HR 60.0 %   ST Depression (mm) 0 mm   Rest Nuclear Isotope Dose 9.9 mCi   Stress Nuclear Isotope Dose 31.6 mCi   SSS 2.0    SRS 0.0    SDS 0.0    TID 0.73    LV sys vol 34.0 3.8 - 5.2 mL   LV dias vol 127.0 46 - 106 mL   Nuc Stress EF 73 %  POCT glycosylated hemoglobin (Hb A1C)     Status: Abnormal  Collection Time: 02/09/24  8:44 AM  Result Value Ref Range   Hemoglobin A1C 9.7 (A) 4.0 - 5.6 %   HbA1c POC (<> result, manual entry)     HbA1c, POC (prediabetic range)     HbA1c, POC (controlled diabetic range)        PHQ2/9:    02/09/2024    8:32 AM 08/25/2023    2:48 PM 03/26/2023    3:44 PM 03/12/2023    9:51 AM 09/22/2022   10:42 AM  Depression screen PHQ 2/9  Decreased Interest 0 0 0 0 0  Down, Depressed, Hopeless 0 0 0 0 0  PHQ - 2 Score 0 0 0 0 0  Altered sleeping  0 0 0 0  Tired, decreased energy  0 0 0 0  Change in appetite  0 0 0 0  Feeling bad or failure about yourself   0 0 0 0  Trouble concentrating  0 0 0 0  Moving slowly or fidgety/restless  0 0 0 0  Suicidal thoughts  0 0 0 0  PHQ-9 Score  0  0  0  0   Difficult doing work/chores  Not difficult at all Not difficult at all Not difficult at all      Data saved with a previous flowsheet row definition    phq 9 is negative  Fall Risk:    02/09/2024    8:32 AM 08/25/2023    2:48 PM 03/26/2023    3:55 PM 03/12/2023    9:51 AM 09/22/2022   10:42 AM   Fall Risk   Falls in the past year? 0 0 0 0 0  Number falls in past yr: 0 0 0 0 0  Injury with Fall? 0 0  0  0  0   Risk for fall due to : No Fall Risks No Fall Risks No Fall Risks No Fall Risks No Fall Risks  Follow up Falls evaluation completed Falls evaluation completed Falls prevention discussed;Falls evaluation completed Falls prevention discussed;Education provided;Falls evaluation completed Falls prevention discussed     Data saved with a previous flowsheet row definition     Assessment & Plan Type 2 diabetes mellitus with nephropathy, obesity, hypertension, and dyslipidemia Poor glycemic control with A1c of 9.7. Insulin  therapy required. Nephropathy necessitates kidney protection. Hypertension generally controlled. Dyslipidemia managed with atorvastatin . - Initiated Soliqua 10 units daily, titrate based on glucose levels to keep fasting between 100-140 , max of 50 units per day  - Continue metformin  and pioglitazone . - Discontinued Starlix  due to hypoglycemia risk with insulin . - Monitor blood glucose daily, target fasting 100-140 mg/dL. - Prescribed losartan for blood pressure and kidney protection. - Ordered lipid panel. - Sent prescription for Novofine needles.  Morbid obesity - BMI over 35, must lose weight - discussed life style modifications  Obstructive sleep apnea Well-managed with CPAP therapy, significant symptom improvement. - Continue CPAP therapy. - Adjusted CPAP mask for comfort and effectiveness.  Gastroesophageal reflux disease Well-controlled with Prilosec. - Continue Prilosec.  General Health Maintenance Routine health maintenance due. Cardiovascular evaluation normal. - Ordered mammogram screening. - Continue cardiology follow-up in one year.        [1]  Current Outpatient Medications:    Ascorbic Acid  (VITAMIN C) 1000 MG tablet, Take 1,000 mg by mouth daily., Disp: , Rfl:    aspirin EC 81 MG tablet, Take 81 mg by mouth daily. Swallow  whole., Disp: , Rfl:    atorvastatin  (LIPITOR) 40 MG tablet, Take 1 tablet (  40 mg total) by mouth daily., Disp: 90 tablet, Rfl: 1   blood glucose meter kit and supplies, Dispense based on patient and insurance preference. Use up to four times daily as directed. (FOR ICD-10 E10.9, E11.9)., Disp: 1 each, Rfl: 0   Calcium  Carb-Cholecalciferol  (CALCIUM  600 + D PO), Take 1 tablet by mouth daily., Disp: , Rfl:    Calcium  Polycarbophil (FIBER-CAPS PO), Take 1 capsule by mouth daily., Disp: , Rfl:    cetirizine (ZYRTEC) 10 MG tablet, Take 10 mg by mouth daily as needed for allergies., Disp: , Rfl:    Cholecalciferol  (VITAMIN D3) 50 MCG (2000 UT) CHEW, Chew by mouth daily., Disp: , Rfl:    CINNAMON PO, Take by mouth daily., Disp: , Rfl:    Coenzyme Q10 (CO Q 10 PO), Take by mouth daily., Disp: , Rfl:    ferrous sulfate  325 (65 FE) MG tablet, Take 325 mg by mouth daily with breakfast., Disp: , Rfl:    glucose blood (ONETOUCH VERIO) test strip, 1 each by Other route in the morning and at bedtime. Use as instructed, Disp: 200 each, Rfl: 5   metFORMIN  (GLUCOPHAGE -XR) 750 MG 24 hr tablet, Take 1 tablet (750 mg total) by mouth 2 (two) times daily., Disp: 180 tablet, Rfl: 1   Multiple Vitamin (MULTIVITAMIN) tablet, Take 1 tablet by mouth daily., Disp: , Rfl:    NON FORMULARY, Joint health 1 tablet daily., Disp: , Rfl:    NON FORMULARY, PB restore 1 tablet daily., Disp: , Rfl:    Omega-3 Fatty Acids (FISH OIL OMEGA-3 PO), Take by mouth daily., Disp: , Rfl:    omeprazole  (PRILOSEC) 20 MG capsule, Take 1 capsule (20 mg total) by mouth daily., Disp: 90 capsule, Rfl: 1   pioglitazone  (ACTOS ) 30 MG tablet, Take 1 tablet (30 mg total) by mouth daily., Disp: 90 tablet, Rfl: 1   psyllium (REGULOID) 0.52 g capsule, Take 0.52 g by mouth daily., Disp: , Rfl:    triamcinolone  (NASACORT ) 55 MCG/ACT AERO nasal inhaler, Place 2 sprays into the nose daily., Disp: , Rfl:  [2]  Allergies Allergen Reactions   Codeine Nausea Only     Passed out   Celecoxib  Hives   Penicillins Rash    IgE = 17 (WNL) on 04/02/2021

## 2024-02-10 LAB — COMPREHENSIVE METABOLIC PANEL WITH GFR
AG Ratio: 1.7 (calc) (ref 1.0–2.5)
ALT: 25 U/L (ref 6–29)
AST: 19 U/L (ref 10–35)
Albumin: 4.3 g/dL (ref 3.6–5.1)
Alkaline phosphatase (APISO): 56 U/L (ref 37–153)
BUN: 15 mg/dL (ref 7–25)
CO2: 31 mmol/L (ref 20–32)
Calcium: 9.4 mg/dL (ref 8.6–10.4)
Chloride: 99 mmol/L (ref 98–110)
Creat: 0.71 mg/dL (ref 0.60–1.00)
Globulin: 2.5 g/dL (ref 1.9–3.7)
Glucose, Bld: 223 mg/dL — ABNORMAL HIGH (ref 65–99)
Potassium: 4.6 mmol/L (ref 3.5–5.3)
Sodium: 137 mmol/L (ref 135–146)
Total Bilirubin: 0.7 mg/dL (ref 0.2–1.2)
Total Protein: 6.8 g/dL (ref 6.1–8.1)
eGFR: 89 mL/min/1.73m2

## 2024-02-10 LAB — MICROALBUMIN / CREATININE URINE RATIO
Creatinine, Urine: 76 mg/dL (ref 20–275)
Microalb Creat Ratio: 129 mg/g{creat} — ABNORMAL HIGH
Microalb, Ur: 9.8 mg/dL

## 2024-02-10 LAB — CBC WITH DIFFERENTIAL/PLATELET
Absolute Lymphocytes: 1350 {cells}/uL (ref 850–3900)
Absolute Monocytes: 589 {cells}/uL (ref 200–950)
Basophils Absolute: 22 {cells}/uL (ref 0–200)
Basophils Relative: 0.4 %
Eosinophils Absolute: 157 {cells}/uL (ref 15–500)
Eosinophils Relative: 2.9 %
HCT: 42.9 % (ref 35.9–46.0)
Hemoglobin: 13.9 g/dL (ref 11.7–15.5)
MCH: 30.8 pg (ref 27.0–33.0)
MCHC: 32.4 g/dL (ref 31.6–35.4)
MCV: 95.1 fL (ref 81.4–101.7)
MPV: 9 fL (ref 7.5–12.5)
Monocytes Relative: 10.9 %
Neutro Abs: 3283 {cells}/uL (ref 1500–7800)
Neutrophils Relative %: 60.8 %
Platelets: 224 Thousand/uL (ref 140–400)
RBC: 4.51 Million/uL (ref 3.80–5.10)
RDW: 12.6 % (ref 11.0–15.0)
Total Lymphocyte: 25 %
WBC: 5.4 Thousand/uL (ref 3.8–10.8)

## 2024-02-10 LAB — VITAMIN D 25 HYDROXY (VIT D DEFICIENCY, FRACTURES): Vit D, 25-Hydroxy: 60 ng/mL (ref 30–100)

## 2024-02-10 LAB — LIPID PANEL
Cholesterol: 96 mg/dL
HDL: 39 mg/dL — ABNORMAL LOW
LDL Cholesterol (Calc): 35 mg/dL
Non-HDL Cholesterol (Calc): 57 mg/dL
Total CHOL/HDL Ratio: 2.5 (calc)
Triglycerides: 136 mg/dL

## 2024-02-10 LAB — B12 AND FOLATE PANEL
Folate: >24 ng/mL
Vitamin B-12: 496 pg/mL (ref 200–1100)

## 2024-02-12 ENCOUNTER — Ambulatory Visit: Payer: Self-pay | Admitting: Family Medicine

## 2024-02-12 DIAGNOSIS — E1121 Type 2 diabetes mellitus with diabetic nephropathy: Secondary | ICD-10-CM

## 2024-02-15 ENCOUNTER — Telehealth: Payer: Self-pay | Admitting: Family Medicine

## 2024-02-15 NOTE — Telephone Encounter (Signed)
Insulin Glargine-Lixisenatide (SOLIQUA) 100-33 UNT-MCG/ML SOPN

## 2024-02-15 NOTE — Telephone Encounter (Signed)
 Rx just sent on 02/09/24

## 2024-02-19 MED ORDER — SOLIQUA 100-33 UNT-MCG/ML ~~LOC~~ SOPN: 15.0000 [IU] | PEN_INJECTOR | Freq: Every day | SUBCUTANEOUS | 0 refills | Status: AC

## 2024-02-19 NOTE — Telephone Encounter (Signed)
 Called pharmacy on clarification they state dial starts at 15units so directions needed to be updated to 15-60.  Rx fixed and resent.

## 2024-03-18 NOTE — Progress Notes (Signed)
 Michelle Montgomery                                          MRN: [id number]   03/18/2024   The [facility] Quality Team Specialist reviewed this patient medical record for the purposes of chart review for care gap closure. The following were reviewed: chart review for care gap closure-glycemic status assessment.    [facility] Quality Team

## 2024-03-31 ENCOUNTER — Ambulatory Visit: Payer: Self-pay

## 2024-05-09 ENCOUNTER — Ambulatory Visit: Admitting: Sleep Medicine

## 2024-05-16 ENCOUNTER — Ambulatory Visit: Admitting: Sleep Medicine

## 2024-06-14 ENCOUNTER — Ambulatory Visit: Admitting: Family Medicine
